# Patient Record
Sex: Female | Born: 1960 | Race: Black or African American | Hispanic: No | Marital: Single | Smoking: Former smoker
Health system: Southern US, Community
[De-identification: ages and names within clinical notes are randomized; demographics above are authoritative.]

## PROBLEM LIST (undated history)

## (undated) DIAGNOSIS — M2550 Pain in unspecified joint: Secondary | ICD-10-CM

## (undated) DIAGNOSIS — T148XXA Other injury of unspecified body region, initial encounter: Secondary | ICD-10-CM

## (undated) DIAGNOSIS — E7800 Pure hypercholesterolemia, unspecified: Secondary | ICD-10-CM

## (undated) DIAGNOSIS — R600 Localized edema: Secondary | ICD-10-CM

## (undated) DIAGNOSIS — H409 Unspecified glaucoma: Secondary | ICD-10-CM

## (undated) DIAGNOSIS — E119 Type 2 diabetes mellitus without complications: Secondary | ICD-10-CM

## (undated) DIAGNOSIS — K219 Gastro-esophageal reflux disease without esophagitis: Secondary | ICD-10-CM

## (undated) DIAGNOSIS — M1990 Unspecified osteoarthritis, unspecified site: Secondary | ICD-10-CM

## (undated) DIAGNOSIS — E559 Vitamin D deficiency, unspecified: Secondary | ICD-10-CM

## (undated) DIAGNOSIS — I10 Essential (primary) hypertension: Secondary | ICD-10-CM

## (undated) DIAGNOSIS — R0602 Shortness of breath: Secondary | ICD-10-CM

## (undated) DIAGNOSIS — M549 Dorsalgia, unspecified: Secondary | ICD-10-CM

## (undated) DIAGNOSIS — F32A Depression, unspecified: Secondary | ICD-10-CM

## (undated) HISTORY — PX: OTHER SURGICAL HISTORY: SHX169

## (undated) HISTORY — DX: Vitamin D deficiency, unspecified: E55.9

## (undated) HISTORY — PX: CHOLECYSTECTOMY: SHX55

## (undated) HISTORY — DX: Gastro-esophageal reflux disease without esophagitis: K21.9

## (undated) HISTORY — DX: Pain in unspecified joint: M25.50

## (undated) HISTORY — DX: Unspecified glaucoma: H40.9

## (undated) HISTORY — DX: Dorsalgia, unspecified: M54.9

## (undated) HISTORY — DX: Pure hypercholesterolemia, unspecified: E78.00

## (undated) HISTORY — DX: Depression, unspecified: F32.A

## (undated) HISTORY — DX: Localized edema: R60.0

## (undated) HISTORY — DX: Shortness of breath: R06.02

## (undated) HISTORY — DX: Other injury of unspecified body region, initial encounter: T14.8XXA

---

## 1999-03-23 HISTORY — PX: CHOLECYSTECTOMY: SHX55

## 1999-05-13 ENCOUNTER — Encounter: Admission: RE | Admit: 1999-05-13 | Discharge: 1999-08-11 | Payer: Self-pay | Admitting: Family Medicine

## 2001-05-16 ENCOUNTER — Encounter: Admission: RE | Admit: 2001-05-16 | Discharge: 2001-05-16 | Payer: Self-pay | Admitting: Family Medicine

## 2001-07-06 ENCOUNTER — Encounter: Admission: RE | Admit: 2001-07-06 | Discharge: 2001-07-06 | Payer: Self-pay | Admitting: Sports Medicine

## 2001-07-10 ENCOUNTER — Encounter: Admission: RE | Admit: 2001-07-10 | Discharge: 2001-07-10 | Payer: Self-pay | Admitting: Family Medicine

## 2001-08-30 ENCOUNTER — Ambulatory Visit (HOSPITAL_COMMUNITY): Admission: RE | Admit: 2001-08-30 | Discharge: 2001-08-30 | Payer: Self-pay | Admitting: Family Medicine

## 2001-08-30 ENCOUNTER — Encounter: Admission: RE | Admit: 2001-08-30 | Discharge: 2001-08-30 | Payer: Self-pay | Admitting: Family Medicine

## 2001-12-19 ENCOUNTER — Encounter: Admission: RE | Admit: 2001-12-19 | Discharge: 2001-12-19 | Payer: Self-pay | Admitting: Family Medicine

## 2001-12-25 ENCOUNTER — Encounter: Admission: RE | Admit: 2001-12-25 | Discharge: 2001-12-25 | Payer: Self-pay | Admitting: Sports Medicine

## 2002-01-09 ENCOUNTER — Encounter: Admission: RE | Admit: 2002-01-09 | Discharge: 2002-01-09 | Payer: Self-pay | Admitting: Family Medicine

## 2002-04-25 ENCOUNTER — Encounter: Admission: RE | Admit: 2002-04-25 | Discharge: 2002-04-25 | Payer: Self-pay | Admitting: Family Medicine

## 2002-06-12 ENCOUNTER — Encounter: Admission: RE | Admit: 2002-06-12 | Discharge: 2002-06-12 | Payer: Self-pay | Admitting: Family Medicine

## 2002-07-31 ENCOUNTER — Encounter: Admission: RE | Admit: 2002-07-31 | Discharge: 2002-07-31 | Payer: Self-pay | Admitting: Sports Medicine

## 2002-09-25 ENCOUNTER — Encounter: Admission: RE | Admit: 2002-09-25 | Discharge: 2002-09-25 | Payer: Self-pay | Admitting: Family Medicine

## 2002-10-22 ENCOUNTER — Encounter: Admission: RE | Admit: 2002-10-22 | Discharge: 2002-10-22 | Payer: Self-pay | Admitting: Family Medicine

## 2002-10-22 ENCOUNTER — Ambulatory Visit (HOSPITAL_COMMUNITY): Admission: RE | Admit: 2002-10-22 | Discharge: 2002-10-22 | Payer: Self-pay | Admitting: Family Medicine

## 2002-10-25 ENCOUNTER — Ambulatory Visit (HOSPITAL_COMMUNITY): Admission: RE | Admit: 2002-10-25 | Discharge: 2002-10-25 | Payer: Self-pay | Admitting: Family Medicine

## 2002-11-21 ENCOUNTER — Encounter: Admission: RE | Admit: 2002-11-21 | Discharge: 2002-11-21 | Payer: Self-pay | Admitting: Family Medicine

## 2002-12-25 ENCOUNTER — Encounter: Admission: RE | Admit: 2002-12-25 | Discharge: 2002-12-25 | Payer: Self-pay | Admitting: Sports Medicine

## 2002-12-25 ENCOUNTER — Encounter: Admission: RE | Admit: 2002-12-25 | Discharge: 2002-12-25 | Payer: Self-pay | Admitting: Family Medicine

## 2002-12-25 ENCOUNTER — Encounter: Payer: Self-pay | Admitting: Sports Medicine

## 2002-12-27 ENCOUNTER — Emergency Department (HOSPITAL_COMMUNITY): Admission: AD | Admit: 2002-12-27 | Discharge: 2002-12-28 | Payer: Self-pay | Admitting: Family Medicine

## 2002-12-28 ENCOUNTER — Encounter: Payer: Self-pay | Admitting: Emergency Medicine

## 2003-06-19 ENCOUNTER — Encounter: Admission: RE | Admit: 2003-06-19 | Discharge: 2003-06-19 | Payer: Self-pay | Admitting: Family Medicine

## 2003-07-17 ENCOUNTER — Encounter: Admission: RE | Admit: 2003-07-17 | Discharge: 2003-07-17 | Payer: Self-pay | Admitting: Sports Medicine

## 2003-09-30 ENCOUNTER — Encounter: Admission: RE | Admit: 2003-09-30 | Discharge: 2003-09-30 | Payer: Self-pay | Admitting: Family Medicine

## 2003-12-16 ENCOUNTER — Ambulatory Visit: Payer: Self-pay | Admitting: Family Medicine

## 2004-03-22 ENCOUNTER — Encounter (INDEPENDENT_AMBULATORY_CARE_PROVIDER_SITE_OTHER): Payer: Self-pay | Admitting: *Deleted

## 2004-03-22 LAB — CONVERTED CEMR LAB

## 2004-03-31 ENCOUNTER — Ambulatory Visit: Payer: Self-pay | Admitting: Family Medicine

## 2004-04-09 ENCOUNTER — Other Ambulatory Visit: Admission: RE | Admit: 2004-04-09 | Discharge: 2004-04-09 | Payer: Self-pay | Admitting: Family Medicine

## 2004-04-09 ENCOUNTER — Ambulatory Visit: Payer: Self-pay | Admitting: Family Medicine

## 2004-07-07 ENCOUNTER — Ambulatory Visit: Payer: Self-pay | Admitting: Family Medicine

## 2004-08-19 ENCOUNTER — Ambulatory Visit: Payer: Self-pay | Admitting: Family Medicine

## 2004-09-07 ENCOUNTER — Ambulatory Visit: Payer: Self-pay | Admitting: Sports Medicine

## 2004-09-11 ENCOUNTER — Encounter: Admission: RE | Admit: 2004-09-11 | Discharge: 2004-09-11 | Payer: Self-pay | Admitting: Sports Medicine

## 2004-09-23 ENCOUNTER — Encounter: Admission: RE | Admit: 2004-09-23 | Discharge: 2004-09-23 | Payer: Self-pay | Admitting: Family Medicine

## 2004-10-02 ENCOUNTER — Ambulatory Visit: Payer: Self-pay | Admitting: Family Medicine

## 2004-10-02 ENCOUNTER — Ambulatory Visit (HOSPITAL_COMMUNITY): Admission: RE | Admit: 2004-10-02 | Discharge: 2004-10-02 | Payer: Self-pay | Admitting: Family Medicine

## 2004-10-05 ENCOUNTER — Ambulatory Visit: Payer: Self-pay | Admitting: Family Medicine

## 2004-10-13 ENCOUNTER — Observation Stay (HOSPITAL_COMMUNITY): Admission: EM | Admit: 2004-10-13 | Discharge: 2004-10-15 | Payer: Self-pay | Admitting: Emergency Medicine

## 2004-10-13 ENCOUNTER — Ambulatory Visit: Payer: Self-pay | Admitting: Family Medicine

## 2004-10-14 ENCOUNTER — Encounter: Payer: Self-pay | Admitting: Cardiology

## 2004-10-14 ENCOUNTER — Ambulatory Visit: Payer: Self-pay | Admitting: Cardiology

## 2004-10-15 ENCOUNTER — Ambulatory Visit: Payer: Self-pay

## 2004-10-16 ENCOUNTER — Ambulatory Visit: Payer: Self-pay

## 2004-10-30 ENCOUNTER — Ambulatory Visit: Payer: Self-pay | Admitting: Family Medicine

## 2004-11-04 ENCOUNTER — Ambulatory Visit: Payer: Self-pay | Admitting: Cardiology

## 2005-01-13 ENCOUNTER — Ambulatory Visit: Payer: Self-pay | Admitting: Family Medicine

## 2005-01-29 ENCOUNTER — Ambulatory Visit: Payer: Self-pay | Admitting: Family Medicine

## 2005-02-25 ENCOUNTER — Ambulatory Visit: Payer: Self-pay | Admitting: Family Medicine

## 2005-03-25 ENCOUNTER — Ambulatory Visit: Payer: Self-pay | Admitting: Family Medicine

## 2005-04-12 ENCOUNTER — Ambulatory Visit: Payer: Self-pay | Admitting: Sports Medicine

## 2005-05-27 ENCOUNTER — Ambulatory Visit: Payer: Self-pay | Admitting: Family Medicine

## 2005-07-10 ENCOUNTER — Emergency Department (HOSPITAL_COMMUNITY): Admission: EM | Admit: 2005-07-10 | Discharge: 2005-07-10 | Payer: Self-pay | Admitting: Emergency Medicine

## 2005-07-15 ENCOUNTER — Ambulatory Visit: Payer: Self-pay | Admitting: Family Medicine

## 2005-07-22 ENCOUNTER — Ambulatory Visit: Payer: Self-pay | Admitting: Family Medicine

## 2005-10-11 ENCOUNTER — Ambulatory Visit: Payer: Self-pay | Admitting: Family Medicine

## 2005-10-13 ENCOUNTER — Emergency Department (HOSPITAL_COMMUNITY): Admission: EM | Admit: 2005-10-13 | Discharge: 2005-10-13 | Payer: Self-pay | Admitting: *Deleted

## 2005-10-13 ENCOUNTER — Encounter: Admission: RE | Admit: 2005-10-13 | Discharge: 2005-10-13 | Payer: Self-pay | Admitting: Family Medicine

## 2005-10-29 ENCOUNTER — Ambulatory Visit: Payer: Self-pay | Admitting: Family Medicine

## 2005-11-15 ENCOUNTER — Ambulatory Visit: Payer: Self-pay | Admitting: Family Medicine

## 2005-12-23 ENCOUNTER — Ambulatory Visit: Payer: Self-pay | Admitting: Family Medicine

## 2006-05-19 DIAGNOSIS — E669 Obesity, unspecified: Secondary | ICD-10-CM | POA: Insufficient documentation

## 2006-05-19 DIAGNOSIS — E7800 Pure hypercholesterolemia, unspecified: Secondary | ICD-10-CM | POA: Insufficient documentation

## 2006-05-19 DIAGNOSIS — F3289 Other specified depressive episodes: Secondary | ICD-10-CM | POA: Insufficient documentation

## 2006-05-19 DIAGNOSIS — I10 Essential (primary) hypertension: Secondary | ICD-10-CM | POA: Insufficient documentation

## 2006-05-19 DIAGNOSIS — E119 Type 2 diabetes mellitus without complications: Secondary | ICD-10-CM | POA: Insufficient documentation

## 2006-05-19 DIAGNOSIS — E1165 Type 2 diabetes mellitus with hyperglycemia: Secondary | ICD-10-CM

## 2006-05-19 DIAGNOSIS — F329 Major depressive disorder, single episode, unspecified: Secondary | ICD-10-CM | POA: Insufficient documentation

## 2006-05-19 DIAGNOSIS — J309 Allergic rhinitis, unspecified: Secondary | ICD-10-CM | POA: Insufficient documentation

## 2006-05-19 DIAGNOSIS — K219 Gastro-esophageal reflux disease without esophagitis: Secondary | ICD-10-CM | POA: Insufficient documentation

## 2006-05-20 ENCOUNTER — Encounter (INDEPENDENT_AMBULATORY_CARE_PROVIDER_SITE_OTHER): Payer: Self-pay | Admitting: *Deleted

## 2006-06-24 ENCOUNTER — Encounter (INDEPENDENT_AMBULATORY_CARE_PROVIDER_SITE_OTHER): Payer: Self-pay | Admitting: Family Medicine

## 2006-06-27 ENCOUNTER — Ambulatory Visit: Payer: Self-pay | Admitting: Sports Medicine

## 2006-06-27 ENCOUNTER — Encounter (INDEPENDENT_AMBULATORY_CARE_PROVIDER_SITE_OTHER): Payer: Self-pay | Admitting: Family Medicine

## 2006-06-27 LAB — CONVERTED CEMR LAB
HDL goal, serum: 40 mg/dL
Hgb A1c MFr Bld: 9.1 %
LDL Goal: 100 mg/dL

## 2006-06-29 ENCOUNTER — Emergency Department (HOSPITAL_COMMUNITY): Admission: EM | Admit: 2006-06-29 | Discharge: 2006-06-29 | Payer: Self-pay | Admitting: Family Medicine

## 2006-06-29 ENCOUNTER — Telehealth: Payer: Self-pay | Admitting: Family Medicine

## 2006-06-30 ENCOUNTER — Telehealth: Payer: Self-pay | Admitting: *Deleted

## 2006-06-30 ENCOUNTER — Encounter (INDEPENDENT_AMBULATORY_CARE_PROVIDER_SITE_OTHER): Payer: Self-pay | Admitting: *Deleted

## 2006-06-30 ENCOUNTER — Ambulatory Visit: Payer: Self-pay | Admitting: Family Medicine

## 2006-06-30 LAB — CONVERTED CEMR LAB
ALT: 20 units/L (ref 0–35)
AST: 18 units/L (ref 0–37)
Albumin: 4.2 g/dL (ref 3.5–5.2)
Alkaline Phosphatase: 87 units/L (ref 39–117)
Potassium: 3.8 meq/L (ref 3.5–5.3)
Sodium: 139 meq/L (ref 135–145)
Total Protein: 7.8 g/dL (ref 6.0–8.3)

## 2006-07-01 ENCOUNTER — Encounter (INDEPENDENT_AMBULATORY_CARE_PROVIDER_SITE_OTHER): Payer: Self-pay | Admitting: Family Medicine

## 2006-10-06 ENCOUNTER — Telehealth: Payer: Self-pay | Admitting: *Deleted

## 2006-10-10 ENCOUNTER — Ambulatory Visit: Payer: Self-pay | Admitting: Family Medicine

## 2006-10-10 LAB — CONVERTED CEMR LAB: Hgb A1c MFr Bld: 7.9 %

## 2007-03-03 ENCOUNTER — Ambulatory Visit: Payer: Self-pay | Admitting: Family Medicine

## 2007-03-03 ENCOUNTER — Encounter (INDEPENDENT_AMBULATORY_CARE_PROVIDER_SITE_OTHER): Payer: Self-pay | Admitting: Family Medicine

## 2007-03-03 LAB — CONVERTED CEMR LAB
AST: 12 units/L (ref 0–37)
Albumin: 4 g/dL (ref 3.5–5.2)
BUN: 8 mg/dL (ref 6–23)
Calcium: 9.4 mg/dL (ref 8.4–10.5)
Chloride: 104 meq/L (ref 96–112)
Glucose, Bld: 174 mg/dL — ABNORMAL HIGH (ref 70–99)
Potassium: 3.7 meq/L (ref 3.5–5.3)

## 2007-06-07 ENCOUNTER — Ambulatory Visit: Payer: Self-pay | Admitting: Family Medicine

## 2007-06-15 ENCOUNTER — Ambulatory Visit: Payer: Self-pay | Admitting: Family Medicine

## 2007-06-15 LAB — CONVERTED CEMR LAB
Glucose, Urine, Semiquant: 250
Protein, U semiquant: NEGATIVE

## 2007-06-29 ENCOUNTER — Encounter (INDEPENDENT_AMBULATORY_CARE_PROVIDER_SITE_OTHER): Payer: Self-pay | Admitting: *Deleted

## 2007-06-29 ENCOUNTER — Encounter (INDEPENDENT_AMBULATORY_CARE_PROVIDER_SITE_OTHER): Payer: Self-pay | Admitting: Family Medicine

## 2007-06-29 ENCOUNTER — Telehealth (INDEPENDENT_AMBULATORY_CARE_PROVIDER_SITE_OTHER): Payer: Self-pay | Admitting: Family Medicine

## 2007-07-06 ENCOUNTER — Ambulatory Visit: Payer: Self-pay | Admitting: Family Medicine

## 2007-07-06 ENCOUNTER — Encounter (INDEPENDENT_AMBULATORY_CARE_PROVIDER_SITE_OTHER): Payer: Self-pay | Admitting: Family Medicine

## 2007-07-13 LAB — CONVERTED CEMR LAB
AST: 14 units/L (ref 0–37)
BUN: 11 mg/dL (ref 6–23)
Calcium: 9.3 mg/dL (ref 8.4–10.5)
Chloride: 102 meq/L (ref 96–112)
Cholesterol: 188 mg/dL (ref 0–200)
Creatinine, Ser: 0.83 mg/dL (ref 0.40–1.20)
HDL: 47 mg/dL (ref >39)
Total CHOL/HDL Ratio: 4
Triglycerides: 96 mg/dL (ref <150)

## 2007-07-24 ENCOUNTER — Encounter: Admission: RE | Admit: 2007-07-24 | Discharge: 2007-10-22 | Payer: Self-pay | Admitting: Family Medicine

## 2007-07-26 ENCOUNTER — Ambulatory Visit: Payer: Self-pay | Admitting: Family Medicine

## 2007-07-28 ENCOUNTER — Encounter (INDEPENDENT_AMBULATORY_CARE_PROVIDER_SITE_OTHER): Payer: Self-pay | Admitting: *Deleted

## 2007-07-29 ENCOUNTER — Emergency Department (HOSPITAL_COMMUNITY): Admission: EM | Admit: 2007-07-29 | Discharge: 2007-07-30 | Payer: Self-pay | Admitting: Emergency Medicine

## 2007-08-01 ENCOUNTER — Ambulatory Visit: Payer: Self-pay | Admitting: Family Medicine

## 2007-08-02 ENCOUNTER — Ambulatory Visit: Payer: Self-pay | Admitting: Family Medicine

## 2007-08-02 ENCOUNTER — Encounter: Payer: Self-pay | Admitting: Family Medicine

## 2007-08-22 ENCOUNTER — Telehealth: Payer: Self-pay | Admitting: Family Medicine

## 2007-08-23 ENCOUNTER — Encounter: Payer: Self-pay | Admitting: Family Medicine

## 2007-09-14 ENCOUNTER — Telehealth (INDEPENDENT_AMBULATORY_CARE_PROVIDER_SITE_OTHER): Payer: Self-pay | Admitting: *Deleted

## 2007-10-02 ENCOUNTER — Telehealth: Payer: Self-pay | Admitting: *Deleted

## 2007-10-03 ENCOUNTER — Ambulatory Visit: Payer: Self-pay | Admitting: Family Medicine

## 2007-10-03 DIAGNOSIS — M719 Bursopathy, unspecified: Secondary | ICD-10-CM

## 2007-10-03 DIAGNOSIS — M67919 Unspecified disorder of synovium and tendon, unspecified shoulder: Secondary | ICD-10-CM | POA: Insufficient documentation

## 2007-10-18 ENCOUNTER — Encounter: Admission: RE | Admit: 2007-10-18 | Discharge: 2007-10-27 | Payer: Self-pay | Admitting: Family Medicine

## 2007-10-23 ENCOUNTER — Ambulatory Visit: Payer: Self-pay | Admitting: Sports Medicine

## 2007-10-25 ENCOUNTER — Telehealth (INDEPENDENT_AMBULATORY_CARE_PROVIDER_SITE_OTHER): Payer: Self-pay | Admitting: Family Medicine

## 2007-10-26 ENCOUNTER — Encounter (INDEPENDENT_AMBULATORY_CARE_PROVIDER_SITE_OTHER): Payer: Self-pay | Admitting: Family Medicine

## 2007-10-27 ENCOUNTER — Telehealth (INDEPENDENT_AMBULATORY_CARE_PROVIDER_SITE_OTHER): Payer: Self-pay | Admitting: *Deleted

## 2007-10-31 ENCOUNTER — Telehealth: Payer: Self-pay | Admitting: *Deleted

## 2007-10-31 ENCOUNTER — Encounter: Payer: Self-pay | Admitting: Family Medicine

## 2007-10-31 ENCOUNTER — Ambulatory Visit: Payer: Self-pay | Admitting: Family Medicine

## 2007-10-31 LAB — CONVERTED CEMR LAB
ALT: 14 units/L (ref 0–35)
AST: 13 units/L (ref 0–37)
Albumin: 4.4 g/dL (ref 3.5–5.2)
Alkaline Phosphatase: 100 units/L (ref 39–117)
Calcium: 10.1 mg/dL (ref 8.4–10.5)
Chloride: 96 meq/L (ref 96–112)
Glucose, Urine, Semiquant: NEGATIVE
Nitrite: NEGATIVE
Potassium: 3.9 meq/L (ref 3.5–5.3)
Protein, U semiquant: 30
Sodium: 137 meq/L (ref 135–145)
Urobilinogen, UA: 0.2
WBC Urine, dipstick: NEGATIVE

## 2007-11-02 ENCOUNTER — Encounter (INDEPENDENT_AMBULATORY_CARE_PROVIDER_SITE_OTHER): Payer: Self-pay | Admitting: Family Medicine

## 2007-11-06 ENCOUNTER — Telehealth: Payer: Self-pay | Admitting: *Deleted

## 2007-11-07 ENCOUNTER — Ambulatory Visit: Payer: Self-pay | Admitting: Family Medicine

## 2007-11-21 ENCOUNTER — Ambulatory Visit: Payer: Self-pay | Admitting: Sports Medicine

## 2007-11-21 LAB — CONVERTED CEMR LAB: Hgb A1c MFr Bld: 10.4 %

## 2007-12-13 ENCOUNTER — Encounter: Admission: RE | Admit: 2007-12-13 | Discharge: 2007-12-13 | Payer: Self-pay | Admitting: Family Medicine

## 2008-01-17 ENCOUNTER — Ambulatory Visit: Payer: Self-pay | Admitting: Family Medicine

## 2008-01-17 ENCOUNTER — Encounter (INDEPENDENT_AMBULATORY_CARE_PROVIDER_SITE_OTHER): Payer: Self-pay | Admitting: Family Medicine

## 2008-01-17 LAB — CONVERTED CEMR LAB
Albumin: 4.3 g/dL (ref 3.5–5.2)
Alkaline Phosphatase: 64 units/L (ref 39–117)
BUN: 13 mg/dL (ref 6–23)
CO2: 24 meq/L (ref 19–32)
Glucose, Bld: 134 mg/dL — ABNORMAL HIGH (ref 70–99)
Potassium: 3.9 meq/L (ref 3.5–5.3)
Sodium: 139 meq/L (ref 135–145)
Total Bilirubin: 0.5 mg/dL (ref 0.3–1.2)
Total Protein: 8 g/dL (ref 6.0–8.3)

## 2008-01-19 ENCOUNTER — Ambulatory Visit: Payer: Self-pay | Admitting: Family Medicine

## 2008-01-22 ENCOUNTER — Telehealth (INDEPENDENT_AMBULATORY_CARE_PROVIDER_SITE_OTHER): Payer: Self-pay | Admitting: Family Medicine

## 2008-01-22 ENCOUNTER — Ambulatory Visit: Payer: Self-pay | Admitting: Family Medicine

## 2008-01-23 ENCOUNTER — Ambulatory Visit: Payer: Self-pay | Admitting: Family Medicine

## 2008-01-24 ENCOUNTER — Ambulatory Visit (HOSPITAL_COMMUNITY): Admission: RE | Admit: 2008-01-24 | Discharge: 2008-01-24 | Payer: Self-pay | Admitting: Family Medicine

## 2008-02-06 ENCOUNTER — Encounter (INDEPENDENT_AMBULATORY_CARE_PROVIDER_SITE_OTHER): Payer: Self-pay | Admitting: Family Medicine

## 2008-02-26 ENCOUNTER — Telehealth: Payer: Self-pay | Admitting: *Deleted

## 2008-03-05 ENCOUNTER — Ambulatory Visit: Payer: Self-pay | Admitting: Family Medicine

## 2008-03-05 DIAGNOSIS — G609 Hereditary and idiopathic neuropathy, unspecified: Secondary | ICD-10-CM | POA: Insufficient documentation

## 2008-03-07 ENCOUNTER — Telehealth: Payer: Self-pay | Admitting: *Deleted

## 2008-03-27 ENCOUNTER — Telehealth: Payer: Self-pay | Admitting: Family Medicine

## 2008-03-27 ENCOUNTER — Telehealth (INDEPENDENT_AMBULATORY_CARE_PROVIDER_SITE_OTHER): Payer: Self-pay | Admitting: Family Medicine

## 2008-03-28 ENCOUNTER — Telehealth (INDEPENDENT_AMBULATORY_CARE_PROVIDER_SITE_OTHER): Payer: Self-pay | Admitting: *Deleted

## 2008-04-04 ENCOUNTER — Ambulatory Visit: Payer: Self-pay | Admitting: Family Medicine

## 2008-04-04 LAB — CONVERTED CEMR LAB: Hgb A1c MFr Bld: 8.5 %

## 2008-04-10 ENCOUNTER — Encounter (INDEPENDENT_AMBULATORY_CARE_PROVIDER_SITE_OTHER): Payer: Self-pay | Admitting: Family Medicine
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
## 2008-04-12 ENCOUNTER — Encounter (INDEPENDENT_AMBULATORY_CARE_PROVIDER_SITE_OTHER): Payer: Self-pay | Admitting: Family Medicine

## 2008-04-12 LAB — CONVERTED CEMR LAB
OCCULT 1: POSITIVE
OCCULT 2: POSITIVE

## 2008-04-15 ENCOUNTER — Telehealth (INDEPENDENT_AMBULATORY_CARE_PROVIDER_SITE_OTHER): Payer: Self-pay | Admitting: *Deleted

## 2008-04-21 ENCOUNTER — Telehealth: Payer: Self-pay | Admitting: Family Medicine

## 2008-04-25 ENCOUNTER — Ambulatory Visit: Payer: Self-pay | Admitting: Family Medicine

## 2008-04-26 ENCOUNTER — Ambulatory Visit: Payer: Self-pay | Admitting: Gastroenterology

## 2008-04-28 ENCOUNTER — Telehealth (INDEPENDENT_AMBULATORY_CARE_PROVIDER_SITE_OTHER): Payer: Self-pay | Admitting: Family Medicine

## 2008-05-09 ENCOUNTER — Ambulatory Visit: Payer: Self-pay | Admitting: Gastroenterology

## 2008-05-09 LAB — CONVERTED CEMR LAB
Basophils Absolute: 0 10*3/uL (ref 0.0–0.1)
Eosinophils Absolute: 0 10*3/uL (ref 0.0–0.7)
Lymphocytes Relative: 27.6 % (ref 12.0–46.0)
MCHC: 32.4 g/dL (ref 30.0–36.0)
MCV: 82.6 fL (ref 78.0–100.0)
Neutrophils Relative %: 63.4 % (ref 43.0–77.0)
Platelets: 345 10*3/uL (ref 150–400)
RBC: 4.62 M/uL (ref 3.87–5.11)
RDW: 13.8 % (ref 11.5–14.6)

## 2008-05-10 ENCOUNTER — Ambulatory Visit: Payer: Self-pay | Admitting: Gastroenterology

## 2008-05-14 ENCOUNTER — Ambulatory Visit: Payer: Self-pay | Admitting: Gastroenterology

## 2008-05-14 LAB — CONVERTED CEMR LAB
Basophils Relative: 0.5 % (ref 0.0–3.0)
Eosinophils Relative: 1.3 % (ref 0.0–5.0)
HCT: 37.1 % (ref 36.0–46.0)
Hemoglobin: 12 g/dL (ref 12.0–15.0)
Lymphocytes Relative: 32 % (ref 12.0–46.0)
Monocytes Absolute: 0.6 10*3/uL (ref 0.1–1.0)
Monocytes Relative: 7.6 % (ref 3.0–12.0)
Neutro Abs: 4.5 10*3/uL (ref 1.4–7.7)
RBC: 4.51 M/uL (ref 3.87–5.11)
RDW: 14 % (ref 11.5–14.6)
WBC: 7.7 10*3/uL (ref 4.5–10.5)

## 2008-05-15 ENCOUNTER — Ambulatory Visit: Payer: Self-pay | Admitting: Gastroenterology

## 2008-05-21 ENCOUNTER — Ambulatory Visit: Payer: Self-pay | Admitting: Family Medicine

## 2008-05-21 ENCOUNTER — Ambulatory Visit: Payer: Self-pay | Admitting: Gastroenterology

## 2008-05-21 DIAGNOSIS — M25519 Pain in unspecified shoulder: Secondary | ICD-10-CM | POA: Insufficient documentation

## 2008-05-21 LAB — CONVERTED CEMR LAB
Fecal Occult Blood: NEGATIVE
OCCULT 1: NEGATIVE
OCCULT 4: NEGATIVE
OCCULT 5: NEGATIVE

## 2008-05-23 ENCOUNTER — Encounter: Payer: Self-pay | Admitting: Gastroenterology

## 2008-05-30 ENCOUNTER — Ambulatory Visit: Payer: Self-pay | Admitting: Family Medicine

## 2008-06-17 ENCOUNTER — Ambulatory Visit: Payer: Self-pay | Admitting: Family Medicine

## 2008-06-17 ENCOUNTER — Encounter (INDEPENDENT_AMBULATORY_CARE_PROVIDER_SITE_OTHER): Payer: Self-pay | Admitting: Family Medicine

## 2008-06-17 DIAGNOSIS — M549 Dorsalgia, unspecified: Secondary | ICD-10-CM | POA: Insufficient documentation

## 2008-06-21 ENCOUNTER — Telehealth (INDEPENDENT_AMBULATORY_CARE_PROVIDER_SITE_OTHER): Payer: Self-pay | Admitting: Family Medicine

## 2008-06-25 ENCOUNTER — Ambulatory Visit: Payer: Self-pay | Admitting: Family Medicine

## 2008-06-25 ENCOUNTER — Encounter (INDEPENDENT_AMBULATORY_CARE_PROVIDER_SITE_OTHER): Payer: Self-pay | Admitting: Family Medicine

## 2008-06-26 ENCOUNTER — Ambulatory Visit (HOSPITAL_COMMUNITY): Admission: RE | Admit: 2008-06-26 | Discharge: 2008-06-26 | Payer: Self-pay | Admitting: Family Medicine

## 2008-06-27 ENCOUNTER — Ambulatory Visit: Payer: Self-pay | Admitting: Family Medicine

## 2008-06-27 ENCOUNTER — Encounter (INDEPENDENT_AMBULATORY_CARE_PROVIDER_SITE_OTHER): Payer: Self-pay | Admitting: Family Medicine

## 2008-06-27 LAB — CONVERTED CEMR LAB

## 2008-06-28 ENCOUNTER — Encounter (INDEPENDENT_AMBULATORY_CARE_PROVIDER_SITE_OTHER): Payer: Self-pay | Admitting: Family Medicine

## 2008-06-28 LAB — CONVERTED CEMR LAB
AST: 11 units/L (ref 0–37)
BUN: 18 mg/dL (ref 6–23)
Calcium: 10.4 mg/dL (ref 8.4–10.5)
Chloride: 102 meq/L (ref 96–112)
Cholesterol: 127 mg/dL (ref 0–200)
Creatinine, Ser: 0.78 mg/dL (ref 0.40–1.20)
HDL: 48 mg/dL (ref >39)
Total Bilirubin: 0.2 mg/dL — ABNORMAL LOW (ref 0.3–1.2)
Total CHOL/HDL Ratio: 2.6
VLDL: 23 mg/dL (ref 0–40)

## 2008-07-02 ENCOUNTER — Encounter (INDEPENDENT_AMBULATORY_CARE_PROVIDER_SITE_OTHER): Payer: Self-pay | Admitting: Family Medicine

## 2008-07-08 ENCOUNTER — Ambulatory Visit: Payer: Self-pay | Admitting: Family Medicine

## 2008-07-08 ENCOUNTER — Encounter (INDEPENDENT_AMBULATORY_CARE_PROVIDER_SITE_OTHER): Payer: Self-pay | Admitting: Family Medicine

## 2008-07-09 ENCOUNTER — Encounter: Admission: RE | Admit: 2008-07-09 | Discharge: 2008-09-11 | Payer: Self-pay | Admitting: Family Medicine

## 2008-07-11 ENCOUNTER — Encounter (INDEPENDENT_AMBULATORY_CARE_PROVIDER_SITE_OTHER): Payer: Self-pay | Admitting: Family Medicine

## 2008-07-22 ENCOUNTER — Ambulatory Visit: Payer: Self-pay | Admitting: Family Medicine

## 2008-07-22 ENCOUNTER — Encounter (INDEPENDENT_AMBULATORY_CARE_PROVIDER_SITE_OTHER): Payer: Self-pay | Admitting: Family Medicine

## 2008-07-22 LAB — CONVERTED CEMR LAB
Glucose, Urine, Semiquant: 100
Urobilinogen, UA: 0.2
WBC Urine, dipstick: NEGATIVE

## 2008-07-29 ENCOUNTER — Encounter (INDEPENDENT_AMBULATORY_CARE_PROVIDER_SITE_OTHER): Payer: Self-pay | Admitting: Family Medicine

## 2008-08-05 ENCOUNTER — Encounter (INDEPENDENT_AMBULATORY_CARE_PROVIDER_SITE_OTHER): Payer: Self-pay | Admitting: Family Medicine

## 2008-08-05 ENCOUNTER — Ambulatory Visit: Payer: Self-pay | Admitting: Family Medicine

## 2008-08-14 ENCOUNTER — Encounter (INDEPENDENT_AMBULATORY_CARE_PROVIDER_SITE_OTHER): Payer: Self-pay | Admitting: Family Medicine

## 2008-08-14 ENCOUNTER — Ambulatory Visit: Payer: Self-pay | Admitting: Family Medicine

## 2008-08-14 ENCOUNTER — Observation Stay (HOSPITAL_COMMUNITY): Admission: AD | Admit: 2008-08-14 | Discharge: 2008-08-15 | Payer: Self-pay | Admitting: Family Medicine

## 2008-08-14 ENCOUNTER — Encounter: Payer: Self-pay | Admitting: *Deleted

## 2008-08-14 ENCOUNTER — Ambulatory Visit: Payer: Self-pay | Admitting: Cardiology

## 2008-08-15 ENCOUNTER — Encounter: Payer: Self-pay | Admitting: Family Medicine

## 2008-08-16 ENCOUNTER — Encounter (INDEPENDENT_AMBULATORY_CARE_PROVIDER_SITE_OTHER): Payer: Self-pay | Admitting: Family Medicine

## 2008-08-28 ENCOUNTER — Encounter (INDEPENDENT_AMBULATORY_CARE_PROVIDER_SITE_OTHER): Payer: Self-pay | Admitting: Family Medicine

## 2008-08-28 ENCOUNTER — Encounter: Payer: Self-pay | Admitting: Cardiology

## 2008-08-28 ENCOUNTER — Ambulatory Visit: Payer: Self-pay | Admitting: Family Medicine

## 2008-08-30 ENCOUNTER — Telehealth: Payer: Self-pay | Admitting: *Deleted

## 2008-09-18 ENCOUNTER — Encounter (INDEPENDENT_AMBULATORY_CARE_PROVIDER_SITE_OTHER): Payer: Self-pay | Admitting: Family Medicine

## 2008-09-19 ENCOUNTER — Encounter (INDEPENDENT_AMBULATORY_CARE_PROVIDER_SITE_OTHER): Payer: Self-pay | Admitting: Family Medicine

## 2008-09-27 ENCOUNTER — Ambulatory Visit: Payer: Self-pay | Admitting: Family Medicine

## 2008-09-27 ENCOUNTER — Encounter: Payer: Self-pay | Admitting: Family Medicine

## 2008-09-30 ENCOUNTER — Ambulatory Visit: Payer: Self-pay | Admitting: Cardiology

## 2008-09-30 ENCOUNTER — Encounter: Payer: Self-pay | Admitting: Family Medicine

## 2008-10-02 ENCOUNTER — Telehealth (INDEPENDENT_AMBULATORY_CARE_PROVIDER_SITE_OTHER): Payer: Self-pay | Admitting: *Deleted

## 2008-10-03 ENCOUNTER — Ambulatory Visit: Payer: Self-pay

## 2008-10-04 ENCOUNTER — Ambulatory Visit: Payer: Self-pay | Admitting: Sports Medicine

## 2008-10-08 ENCOUNTER — Ambulatory Visit: Payer: Self-pay

## 2008-10-08 ENCOUNTER — Encounter: Payer: Self-pay | Admitting: Cardiology

## 2008-10-24 ENCOUNTER — Telehealth (INDEPENDENT_AMBULATORY_CARE_PROVIDER_SITE_OTHER): Payer: Self-pay | Admitting: *Deleted

## 2008-11-05 ENCOUNTER — Telehealth: Payer: Self-pay | Admitting: *Deleted

## 2008-11-06 ENCOUNTER — Ambulatory Visit: Payer: Self-pay | Admitting: Internal Medicine

## 2008-11-07 ENCOUNTER — Telehealth: Payer: Self-pay | Admitting: Family Medicine

## 2008-11-13 ENCOUNTER — Encounter: Payer: Self-pay | Admitting: Cardiology

## 2008-11-22 ENCOUNTER — Encounter: Payer: Self-pay | Admitting: Cardiology

## 2008-11-26 ENCOUNTER — Ambulatory Visit: Payer: Self-pay | Admitting: Cardiology

## 2008-11-27 ENCOUNTER — Ambulatory Visit: Payer: Self-pay | Admitting: Cardiology

## 2008-11-28 ENCOUNTER — Ambulatory Visit: Payer: Self-pay | Admitting: Family Medicine

## 2008-11-28 ENCOUNTER — Encounter: Payer: Self-pay | Admitting: Family Medicine

## 2008-12-08 ENCOUNTER — Telehealth: Payer: Self-pay | Admitting: Family Medicine

## 2008-12-13 ENCOUNTER — Encounter: Admission: RE | Admit: 2008-12-13 | Discharge: 2008-12-13 | Payer: Self-pay | Admitting: Family Medicine

## 2008-12-27 ENCOUNTER — Ambulatory Visit: Payer: Self-pay | Admitting: Family Medicine

## 2008-12-27 ENCOUNTER — Encounter: Payer: Self-pay | Admitting: Family Medicine

## 2008-12-27 LAB — CONVERTED CEMR LAB
ALT: 20 units/L (ref 0–35)
AST: 14 units/L (ref 0–37)
Alkaline Phosphatase: 85 units/L (ref 39–117)
Creatinine, Ser: 0.9 mg/dL (ref 0.40–1.20)
Glucose, Urine, Semiquant: >=1000
Hgb A1c MFr Bld: 12.3 %
Protein, U semiquant: 30
Total Bilirubin: 0.4 mg/dL (ref 0.3–1.2)

## 2009-02-03 ENCOUNTER — Ambulatory Visit: Payer: Self-pay | Admitting: Family Medicine

## 2009-02-05 ENCOUNTER — Ambulatory Visit: Payer: Self-pay | Admitting: Family Medicine

## 2009-02-07 ENCOUNTER — Ambulatory Visit: Payer: Self-pay | Admitting: Family Medicine

## 2009-03-05 ENCOUNTER — Encounter: Admission: RE | Admit: 2009-03-05 | Discharge: 2009-03-21 | Payer: Self-pay | Admitting: Family Medicine

## 2009-03-05 ENCOUNTER — Encounter: Payer: Self-pay | Admitting: Family Medicine

## 2009-03-14 ENCOUNTER — Telehealth: Payer: Self-pay | Admitting: Family Medicine

## 2009-04-09 ENCOUNTER — Encounter: Payer: Self-pay | Admitting: Family Medicine

## 2009-04-09 ENCOUNTER — Ambulatory Visit: Payer: Self-pay | Admitting: Family Medicine

## 2009-04-09 DIAGNOSIS — I498 Other specified cardiac arrhythmias: Secondary | ICD-10-CM | POA: Insufficient documentation

## 2009-04-09 LAB — CONVERTED CEMR LAB
Direct LDL: 66 mg/dL
Hemoglobin: 13.4 g/dL

## 2009-04-10 ENCOUNTER — Encounter: Admission: RE | Admit: 2009-04-10 | Discharge: 2009-04-10 | Payer: Self-pay | Admitting: Family Medicine

## 2009-04-10 ENCOUNTER — Encounter: Payer: Self-pay | Admitting: Family Medicine

## 2009-04-10 ENCOUNTER — Telehealth: Payer: Self-pay | Admitting: Family Medicine

## 2009-04-11 ENCOUNTER — Ambulatory Visit: Payer: Self-pay | Admitting: Family Medicine

## 2009-04-12 ENCOUNTER — Telehealth: Payer: Self-pay | Admitting: Family Medicine

## 2009-04-22 ENCOUNTER — Telehealth: Payer: Self-pay | Admitting: Family Medicine

## 2009-06-06 ENCOUNTER — Ambulatory Visit: Payer: Self-pay | Admitting: Family Medicine

## 2009-07-04 ENCOUNTER — Telehealth (INDEPENDENT_AMBULATORY_CARE_PROVIDER_SITE_OTHER): Payer: Self-pay | Admitting: *Deleted

## 2009-07-07 ENCOUNTER — Ambulatory Visit: Payer: Self-pay | Admitting: Family Medicine

## 2009-07-07 DIAGNOSIS — M25569 Pain in unspecified knee: Secondary | ICD-10-CM | POA: Insufficient documentation

## 2009-07-15 ENCOUNTER — Encounter: Payer: Self-pay | Admitting: Family Medicine

## 2009-07-22 ENCOUNTER — Ambulatory Visit: Payer: Self-pay | Admitting: Family Medicine

## 2009-07-22 LAB — CONVERTED CEMR LAB: Hgb A1c MFr Bld: 11.8 %

## 2009-08-01 ENCOUNTER — Ambulatory Visit: Payer: Self-pay | Admitting: Family Medicine

## 2009-08-01 ENCOUNTER — Encounter: Payer: Self-pay | Admitting: Family Medicine

## 2009-08-01 DIAGNOSIS — R609 Edema, unspecified: Secondary | ICD-10-CM | POA: Insufficient documentation

## 2009-08-01 LAB — CONVERTED CEMR LAB
ALT: 20 units/L (ref 0–35)
AST: 15 units/L (ref 0–37)
Calcium: 9.3 mg/dL (ref 8.4–10.5)
Chloride: 100 meq/L (ref 96–112)
Creatinine, Ser: 0.86 mg/dL (ref 0.40–1.20)
Potassium: 4.3 meq/L (ref 3.5–5.3)
Sodium: 138 meq/L (ref 135–145)

## 2009-08-19 ENCOUNTER — Telehealth (INDEPENDENT_AMBULATORY_CARE_PROVIDER_SITE_OTHER): Payer: Self-pay | Admitting: Pharmacist

## 2009-08-25 ENCOUNTER — Telehealth: Payer: Self-pay | Admitting: Family Medicine

## 2009-08-26 ENCOUNTER — Ambulatory Visit: Payer: Self-pay | Admitting: Family Medicine

## 2009-09-09 ENCOUNTER — Ambulatory Visit: Payer: Self-pay | Admitting: Family Medicine

## 2009-09-09 ENCOUNTER — Encounter (INDEPENDENT_AMBULATORY_CARE_PROVIDER_SITE_OTHER): Payer: Self-pay | Admitting: Pharmacist

## 2009-09-09 LAB — CONVERTED CEMR LAB
Chloride: 99 meq/L (ref 96–112)
Potassium: 4.4 meq/L (ref 3.5–5.3)
Sodium: 139 meq/L (ref 135–145)

## 2009-10-02 ENCOUNTER — Ambulatory Visit: Payer: Self-pay | Admitting: Family Medicine

## 2009-10-14 ENCOUNTER — Ambulatory Visit: Payer: Self-pay | Admitting: Family Medicine

## 2009-10-15 ENCOUNTER — Encounter: Admission: RE | Admit: 2009-10-15 | Discharge: 2009-10-15 | Payer: Self-pay | Admitting: Family Medicine

## 2009-10-31 ENCOUNTER — Ambulatory Visit: Payer: Self-pay | Admitting: Family Medicine

## 2009-11-12 ENCOUNTER — Ambulatory Visit: Payer: Self-pay | Admitting: Family Medicine

## 2009-11-12 DIAGNOSIS — L919 Hypertrophic disorder of the skin, unspecified: Secondary | ICD-10-CM

## 2009-11-12 DIAGNOSIS — L909 Atrophic disorder of skin, unspecified: Secondary | ICD-10-CM | POA: Insufficient documentation

## 2009-12-15 ENCOUNTER — Encounter: Admission: RE | Admit: 2009-12-15 | Discharge: 2009-12-15 | Payer: Self-pay | Admitting: Family Medicine

## 2010-01-28 ENCOUNTER — Encounter: Payer: Self-pay | Admitting: Family Medicine

## 2010-01-30 ENCOUNTER — Ambulatory Visit: Payer: Self-pay | Admitting: Family Medicine

## 2010-01-30 ENCOUNTER — Encounter: Payer: Self-pay | Admitting: Family Medicine

## 2010-02-11 ENCOUNTER — Telehealth (INDEPENDENT_AMBULATORY_CARE_PROVIDER_SITE_OTHER): Payer: Self-pay | Admitting: *Deleted

## 2010-02-17 ENCOUNTER — Encounter: Payer: Self-pay | Admitting: Family Medicine

## 2010-02-20 ENCOUNTER — Encounter: Payer: Self-pay | Admitting: Family Medicine

## 2010-03-02 ENCOUNTER — Encounter (INDEPENDENT_AMBULATORY_CARE_PROVIDER_SITE_OTHER): Payer: Self-pay | Admitting: *Deleted

## 2010-03-05 ENCOUNTER — Telehealth (INDEPENDENT_AMBULATORY_CARE_PROVIDER_SITE_OTHER): Payer: Self-pay | Admitting: *Deleted

## 2010-03-09 ENCOUNTER — Encounter: Payer: Self-pay | Admitting: Family Medicine

## 2010-03-12 ENCOUNTER — Encounter: Payer: Self-pay | Admitting: Family Medicine

## 2010-04-12 ENCOUNTER — Encounter: Payer: Self-pay | Admitting: Sports Medicine

## 2010-04-13 ENCOUNTER — Encounter: Payer: Self-pay | Admitting: Family Medicine

## 2010-04-21 NOTE — Progress Notes (Signed)
  Phone Note Outgoing Call   Call placed by: Lequita Asal  MD,  April 10, 2009 9:19 AM Summary of Call: called to discuss results. A1C >14. needs insulin but opposed. patient to schedule in rx clinic.  Initial call taken by: Lequita Asal  MD,  April 10, 2009 9:20 AM

## 2010-04-21 NOTE — Assessment & Plan Note (Signed)
Summary: Diabetes F/U - Rx Clinic   Vital Signs:  Patient profile:   50 year old female Weight:      293.7 pounds BMI:     55.69 Pulse rate:   66 / minute BP sitting:   133 / 79  (left arm)  Primary Care Provider:  Lequita Asal  MD   History of Present Illness: Patient arrives in great spirits.   She is happy that she is able to walk.  She is able to walk 30 minutes in the AM AND in the PM with the addition of acetaminophen.   Her blood glucose readings are consistently 90-140 throughout the entire day with the addition of Victoza.   We discussed insulin dosing and she is aware that we may be able to reduce her insulin dose if she continues to lose weight.   Patient states walking has helped to reduce leg swelling.   Habits & Providers  Alcohol-Tobacco-Diet     Tobacco Status: quit  Current Medications (verified): 1)  Aspirin Ec 81 Mg Tbec (Aspirin) .... Take 1 Tablet By Mouth Once A Day 2)  Caltrate 600+d Plus 600-400 Mg-Unit Tabs (Calcium Carbonate-Vit D-Min) .... Take 1 Tablet By Mouth Twice A Day 3)  Quinapril-Hydrochlorothiazide 20-25 Mg Tabs (Quinapril-Hydrochlorothiazide) .... One Tab By Mouth Daily 4)  Glipizide 10 Mg  Tabs (Glipizide) .Marland Kitchen.. 1 Tablet By Mouth Two Times A Day 5)  Nitroglycerin 0.4 Mg Subl (Nitroglycerin) .... Place 1 Tablet Under Tongue As Directed 6)  Prodigy Blood Glucose Test  Strp (Glucose Blood) .... Use As Directed. 7)  Flexeril 10 Mg  Tabs (Cyclobenzaprine Hcl) .Marland Kitchen.. 1 Tab By Mouth At Bedtime As Needed For Back Pain 8)  Flonase 50 Mcg/act  Susp (Fluticasone Propionate) .Marland Kitchen.. 1-2 Sprays Each Nostril Two Times A Day As Needed For Allergy Symptoms 9)  Metformin Hcl 500 Mg Tabs (Metformin Hcl) .... One Tab By Mouth Bid 10)  Simvastatin 40 Mg Tabs (Simvastatin) .... One Tab By Mouth Qhs 11)  Loratadine 10 Mg Tabs (Loratadine) .... One Tab By Mouth Daily 12)  Amitriptyline Hcl 100 Mg Tabs (Amitriptyline Hcl) .Marland Kitchen.. 1 Tablet By Mouth At Bedtime 13)   Gabapentin 100 Mg Caps (Gabapentin) .... One Tab By Mouth Tid 14)  Lantus 100 Unit/ml Soln (Insulin Glargine) .... 48 Units Twice A Day. Dispense Qs For 3 Month Supply. 15)  Bd Insulin Syringe Ultrafine 30g X 1/2" 1 Ml Misc (Insulin Syringe-Needle U-100) .... Dispence Quantity Sufficient For Once Daily Injection.  Dispense One Box. 16)  Metoprolol Tartrate 50 Mg Tabs (Metoprolol Tartrate) .... One Tab By Mouth Bid 17)  Meloxicam 15 Mg Tabs (Meloxicam) .... One Tablet Daily With Food For Pain 18)  Furosemide 20 Mg Tabs (Furosemide) .... One Tab By Mouth Daily For Fluid Retention/swelling 19)  Potassium Chloride 20 Meq Pack (Potassium Chloride) .... One Tab By Mouth Daily With Furosemide 20)  Acetaminophen 325 Mg  Tabs (Acetaminophen) .... Take 2 Three Times A Day 21)  Bd Pen Needle Short U/f 31g X 8 Mm Misc (Insulin Pen Needle) .... Dispense Enough For Once Daily Dosing. 22)  Victoza 18 Mg/70ml Soln (Liraglutide) .... Inject Once Daily.  Dispense One Month Supply.  Allergies (verified): No Known Drug Allergies  Social History: Smoking Status:  quit   Impression & Recommendations:  Problem # 1:  DIABETES MELLITUS, TYPE II, UNCONTROLLED (ICD-250.02) Assessment Improved  Diabetes of: longstanding duration currently under excellent control of blood glucose based on CBGs of:  90-140 following addition  of Victoza and increase in exercise regimen to twice daily.   Denies hypoglycemic events. Discussed potential need to decrease insulin in the near future.  Able to verbalize appropriate hypoglycemia management plan.   Continued basal insulin Lantus in twice daily dosing 48 units at this visit.   Written pt instructions provided:   F/U to meet new Dr. Tye Savoy in 2 weeks.  F/U Rx Clinic Visit: 6 weeks.  TTFFC: 20 mins.   Her updated medication list for this problem includes:    Aspirin Ec 81 Mg Tbec (Aspirin) .Marland Kitchen... Take 1 tablet by mouth once a day    Quinapril-hydrochlorothiazide 20-25 Mg Tabs  (Quinapril-hydrochlorothiazide) ..... One tab by mouth daily    Glipizide 10 Mg Tabs (Glipizide) .Marland Kitchen... 1 tablet by mouth two times a day    Metformin Hcl 500 Mg Tabs (Metformin hcl) ..... One tab by mouth bid    Lantus 100 Unit/ml Soln (Insulin glargine) .Marland KitchenMarland KitchenMarland KitchenMarland Kitchen 48 units twice a day. dispense qs for 3 month supply.    Victoza 18 Mg/84ml Soln (Liraglutide) ..... Inject once daily.  dispense one month supply.    Her updated medication list for this problem includes:    Aspirin Ec 81 Mg Tbec (Aspirin) .Marland Kitchen... Take 1 tablet by mouth once a day    Quinapril-hydrochlorothiazide 20-25 Mg Tabs (Quinapril-hydrochlorothiazide) ..... One tab by mouth daily    Glipizide 10 Mg Tabs (Glipizide) .Marland Kitchen... 1 tablet by mouth two times a day    Metformin Hcl 500 Mg Tabs (Metformin hcl) ..... One tab by mouth bid    Lantus 100 Unit/ml Soln (Insulin glargine) .Marland KitchenMarland KitchenMarland KitchenMarland Kitchen 48 units twice a day. dispense qs for 3 month supply.    Victoza 18 Mg/17ml Soln (Liraglutide) ..... Inject once daily.  dispense one month supply.  Orders: Reassessment Each 15 min unit- [facility] (76195)  Problem # 2:  HYPERCHOLESTEROLEMIA (ICD-272.0) Assessment: Unchanged  Needs reevaluation of cholesterol panel  at next blood draw.  Appears adherent to statin tx.  Suggest lab in two weeks.  Her updated medication list for this problem includes:    Simvastatin 40 Mg Tabs (Simvastatin) ..... One tab by mouth qhs    Her updated medication list for this problem includes:    Simvastatin 40 Mg Tabs (Simvastatin) ..... One tab by mouth qhs  Orders: Reassessment Each 15 min unit- [facility] (09326)  Problem # 3:  OBESITY, NOS (ICD-278.00) Assessment: Improved  Loss of 2 pounds in the last 2 weeks.   Praised for increase in exercise.  Asked for plan to continue walking AND goal weight loss of at least 1 pound per week.  Patient agreed to walking plan of 30 minutes twice daily.  Acetaminophen has helped with knee pain.    [id number]  Complete Medication List: 1)  Aspirin Ec 81 Mg Tbec (Aspirin) .... Take 1 tablet by mouth once a day 2)  Caltrate 600+d Plus 600-400 Mg-unit Tabs (Calcium carbonate-vit d-min) .... Take 1 tablet by mouth twice a day 3)  Quinapril-hydrochlorothiazide 20-25 Mg Tabs (Quinapril-hydrochlorothiazide) .... One tab by mouth daily 4)  Glipizide 10 Mg Tabs (Glipizide) .Marland Kitchen.. 1 tablet by mouth two times a day 5)  Nitroglycerin 0.4 Mg Subl (Nitroglycerin) .... Place 1 tablet under tongue as directed 6)  Prodigy Blood Glucose Test Strp (Glucose blood) .... Use as directed. 7)  Flexeril 10 Mg Tabs (Cyclobenzaprine hcl) .Marland Kitchen.. 1 tab by mouth at bedtime as needed for back pain 8)  Flonase  50 Mcg/act Susp (Fluticasone propionate) .Marland Kitchen.. 1-2 sprays each nostril two times a day as needed for allergy symptoms 9)  Metformin Hcl 500 Mg Tabs (Metformin hcl) .... One tab by mouth bid 10)  Simvastatin 40 Mg Tabs (Simvastatin) .... One tab by mouth qhs 11)  Loratadine 10 Mg Tabs (Loratadine) .... One tab by mouth daily 12)  Amitriptyline Hcl 100 Mg Tabs (Amitriptyline hcl) .Marland Kitchen.. 1 tablet by mouth at bedtime 13)  Gabapentin 100 Mg Caps (Gabapentin) .... One tab by mouth tid 14)  Lantus 100 Unit/ml Soln (Insulin glargine) .... 48 units twice a day. dispense qs for 3 month supply. 15)  Bd Insulin Syringe Ultrafine 30g X 1/2" 1 Ml Misc (Insulin syringe-needle u-100) .... Dispence quantity sufficient for once daily injection.  dispense one box. 16)  Metoprolol Tartrate 50 Mg Tabs (Metoprolol tartrate) .... One tab by mouth bid 17)  Meloxicam 15 Mg Tabs (Meloxicam) .... One tablet daily with food for pain 18)  Furosemide 20 Mg Tabs (Furosemide) .... One tab by mouth daily for fluid retention/swelling 19)  Potassium Chloride 20 Meq Pack (Potassium chloride) .... One tab by mouth daily with furosemide 20)  Acetaminophen 325 Mg Tabs (Acetaminophen) .... Take 2 three times a day 21)  Bd Pen Needle Short U/f 31g X 8 Mm Misc  (Insulin pen needle) .... Dispense enough for once daily dosing. 22)  Victoza 18 Mg/54ml Soln (Liraglutide) .... Inject once daily.  dispense one month supply.  Patient Instructions: 1)  Keep up the great work! 2)  Walk 7 days a week for 30 minutes.  3)  No change in your diabetes medicine today.  4)  F/U with Dr. Tye Savoy on 7/28  5)  F/U in Rx clinic in late August.   Prevention & Chronic Care Immunizations   Influenza vaccine: Fluvax Non-MCR  (12/27/2008)   Influenza vaccine deferral: Not indicated  (04/09/2009)   Influenza vaccine due: 01/16/2009    Tetanus booster: 04/04/2008: Tdap   Tetanus booster due: 04/04/2018    Pneumococcal vaccine: Pneumovax  (12/27/2008)   Pneumococcal vaccine due: 03/23/2023  Colorectal Screening   Hemoccult: abnormal  (04/06/2008)   Hemoccult due: Not Indicated    Colonoscopy: Location:  [facility].    (05/09/2008)   Colonoscopy due: 05/09/2018  Other Screening   Pap smear: normal  (06/24/2006)   Pap smear due: 06/23/2009    Mammogram: ASSESSMENT: Negative - BI-RADS 1^MM DIGITAL SCREENING  (12/13/2008)   Mammogram due: 12/15/2008   Smoking status: quit  (10/02/2009)  Diabetes Mellitus   HgbA1C: 11.8  (07/22/2009)   Hemoglobin A1C due: 09/26/2008    Eye exam: normal  (02/06/2008)   Eye exam due: 02/05/2009    Foot exam: yes  (12/27/2008)   High risk foot: Not documented   Foot care education: Not documented   Foot exam due: 06/25/2009    Urine microalbumin/creatinine ratio: Not documented   Urine microalbumin/cr due: 06/27/2009    Diabetes flowsheet reviewed?: Yes   Progress toward A1C goal: Improved  Lipids   Total Cholesterol: 127  (06/27/2008)   LDL: 56  (06/27/2008)   LDL Direct: 66  (04/09/2009)   HDL: 48  (06/27/2008)   Triglycerides: 115  (06/27/2008)    SGOT (AST): 15  (08/01/2009)   SGPT (ALT): 20  (08/01/2009)   Alkaline phosphatase: 85  (08/01/2009)   Total bilirubin: 0.3  (08/01/2009)     Lipid flowsheet reviewed?: Yes   Progress toward LDL goal: Unchanged  Hypertension  Last Blood Pressure: 133 / 79  (10/02/2009)   Serum creatinine: 0.83  (09/09/2009)   Serum potassium 4.4  (09/09/2009)    Hypertension flowsheet reviewed?: Yes   Progress toward BP goal: At goal  Self-Management Support :   Personal Goals (by the next clinic visit) :     Personal A1C goal: 8  (11/28/2008)     Personal blood pressure goal: 130/80  (11/28/2008)     Personal LDL goal: 100  (11/28/2008)     Home glucose monitoring frequency: 2 times a day  (11/28/2008)    Diabetes self-management support: CBG self-monitoring log, Written self-care plan  (10/02/2009)   Diabetes care plan printed    Diabetes self-management support not done because: Good outcomes  (10/02/2009)    Hypertension self-management support: Written self-care plan  (10/02/2009)   Hypertension self-care plan printed.    Hypertension self-management support not done because: Good outcomes  (10/02/2009)    Lipid self-management support: Written self-care plan  (10/02/2009)   Lipid self-care plan printed.    Lipid self-management support not done because: Good outcomes  (10/02/2009)

## 2010-04-21 NOTE — Miscellaneous (Signed)
Summary: Received refill request from pharmacy for Victoza  Does this pt need a new script?  Clinical Lists Changes received refill request from pharmacy for Victoza.  it is noted on 10/31/2009 Dr. Raymondo Band gave her an rx for this and noted dispense 3 month supply then  # 1 and 3 refills. called pharmacist and updated this information they did not get the message about despensing 3 month supply for each RX. Theresia Lo RN  January 28, 2010 12:21 PM

## 2010-04-21 NOTE — Progress Notes (Signed)
Summary: refill  Phone Note Refill Request Call back at [phone] Message from:  Patient  Refills Requested: Medication #1:  VICTOZA 18 MG/3ML SOLN Inject once daily. Dose is 1.2 daily.  dispense 3 month supply.. pt is out and needs today  Initial call taken by: De Nurse,  February 11, 2010 11:37 AM  Follow-up for Phone Call        patient notified that RX has been sent  to pharmacy and approved by [facility]. . Follow-up by: Theresia Lo RN,  February 11, 2010 11:59 AM

## 2010-04-21 NOTE — Assessment & Plan Note (Signed)
Summary: Diabetes - Lantus Start - Rx   Vital Signs:  Patient profile:   50 year old female Height:      62 inches Weight:      271 pounds BMI:     49.75 Pulse rate:   120 / minute BP sitting:   118 / 85  (right arm)  Primary Care Provider:  Lequita Asal  MD   History of Present Illness: BG history - 7:30 am reading of 210, patient reports BGs in the 200s and 300s  Mostly running in the 200s, occasionally in the 300s (2-3x/month), lowest readings in previous month of 180.  Patient is asymptomatic, except for dry mouth.    Diet:  pt does not drink soda or koolaid, mostly water (drinks 6-7 bottles/day) brkfast:  wheat bread, bacon, eggs, fruit lunch:  salads Patient does not eat fried food dinner:  small dinners, many sandwiches, bake meats  exercise:  pt reports back problems, walk outside to [employer] (1.5 miles) every other day, patient sometimes jogs in place.  Patient is attempting to lose weight and has a goal in mind.    history of diabetes for 3-4 years  Patient is apprehensive about starting insulin therapy.  Her concerns are due to witnessing her sister suffer complications of diabetes (which she thinks were due to start of insulin therapy).  Patient is also scared of giving herself an injection.    Patient is wililng to work on weight loss.  She states her highest weight has been 275 pounds and her goal weight would be 175 pounds.      Current Medications (verified): 1)  Aspirin Ec 81 Mg Tbec (Aspirin) .... Take 1 Tablet By Mouth Once A Day 2)  Caltrate 600+d Plus 600-400 Mg-Unit Tabs (Calcium Carbonate-Vit D-Min) .... Take 1 Tablet By Mouth Twice A Day 3)  Quinapril-Hydrochlorothiazide 20-25 Mg Tabs (Quinapril-Hydrochlorothiazide) .... One Tab By Mouth Daily 4)  Glipizide 10 Mg  Tabs (Glipizide) .Marland Kitchen.. 1 Tablet By Mouth Two Times A Day 5)  Nitroglycerin 0.4 Mg Subl (Nitroglycerin) .... Place 1 Tablet Under Tongue As Directed 6)  Onetouch Test  Strp (Glucose Blood)  .Marland Kitchen.. 1 Strip Every Morning, 1 Box 7)  Flexeril 10 Mg  Tabs (Cyclobenzaprine Hcl) .Marland Kitchen.. 1 Tab By Mouth At Bedtime As Needed For Back Pain 8)  Flonase 50 Mcg/act  Susp (Fluticasone Propionate) .Marland Kitchen.. 1-2 Sprays Each Nostril Two Times A Day As Needed For Allergy Symptoms 9)  Metformin Hcl 1000 Mg Tabs (Metformin Hcl) .Marland Kitchen.. 1 Tablet By Mouth Two Times A Day With Food 10)  Simvastatin 40 Mg Tabs (Simvastatin) .... One Tab By Mouth Qhs 11)  Loratadine 10 Mg Tabs (Loratadine) .... One Tab By Mouth Daily 12)  Amitriptyline Hcl 100 Mg Tabs (Amitriptyline Hcl) .Marland Kitchen.. 1 Tablet By Mouth At Bedtime 13)  Gabapentin 100 Mg Caps (Gabapentin) .... One Tab By Mouth Tid 14)  Vesicare 10 Mg Tabs (Solifenacin Succinate) .... One Tab By Mouth Daily 15)  Lantus 100 Unit/ml Soln (Insulin Glargine) .... Inject 15 Units Daily and Then Adjust As Directed.  Increase By 1 Unit Daily If Am Fasting Blood Glucose >100.    Dispense One Month Supply. 16)  Bd Insulin Syringe Ultrafine 30g X 1/2" 1 Ml Misc (Insulin Syringe-Needle U-100) .... Dispence Quantity Sufficient For Once Daily Injection.  Dispense One Box.  Allergies (verified): No Known Drug Allergies   Impression & Recommendations:  Problem # 1:  DIABETES MELLITUS, TYPE II, UNCONTROLLED (ICD-250.02) Assessment Deteriorated  Diabetes  of: 3-4  yrs duration currently under: poor control of blood glucose based on A1C of: 14 with fasting CBGs ranging from 200s to 300s.  Control is suboptimal due to probable insulin resistence due to poor weight control.  Denies hypoglycemic events.  Have discussed A1c value with patient as well as macro and microvascular complications associated with uncontrolled diabetes.  Patient confirmed an understanding of this.  Provided injection technique education as well as hypoglycemia management.  Patient demonstrated her ability to inject insulin and has given herself a first lantus dose today in office.  Able to verbalize appropriate hypoglycemia  management plan.  Initiated basal insulin Lantus of 15 units.    Pt will continue to titrate 1 unit if fasting CBGs > 100 until fasting CBGs reach goal.  Written pt instructions provided.   F/U Rx Clinic Visit:  2-3 weeks   TTFFC: 45 mins.     Pt seen with Massie Bougie, PharmD Candidate, Eda Keys, PharmD Resident  Her updated medication list for this problem includes:    Aspirin Ec 81 Mg Tbec (Aspirin) .Marland Kitchen... Take 1 tablet by mouth once a day    Quinapril-hydrochlorothiazide 20-25 Mg Tabs (Quinapril-hydrochlorothiazide) ..... One tab by mouth daily    Glipizide 10 Mg Tabs (Glipizide) .Marland Kitchen... 1 tablet by mouth two times a day    Metformin Hcl 1000 Mg Tabs (Metformin hcl) .Marland Kitchen... 1 tablet by mouth two times a day with food    Lantus 100 Unit/ml Soln (Insulin glargine) ..... Inject 15 units daily and then adjust as directed.  increase by 1 unit daily if am fasting blood glucose >100.    dispense one month supply.  Orders: Inital Assessment Each - [facility] [phone])  Problem # 2:  OBESITY, NOS (ICD-278.00) Assessment: Unchanged Patient willing to work on weight loss.  She was praised for losing 3 pounds.  She is willing to walk more.  Additioanl encouragement for walking longer distance and walking every day should remain focus of routine visits.  She appears to be eating well.   She is able to verbalize  good eating habit changes she has made.   Complete Medication List: 1)  Aspirin Ec 81 Mg Tbec (Aspirin) .... Take 1 tablet by mouth once a day 2)  Caltrate 600+d Plus 600-400 Mg-unit Tabs (Calcium carbonate-vit d-min) .... Take 1 tablet by mouth twice a day 3)  Quinapril-hydrochlorothiazide 20-25 Mg Tabs (Quinapril-hydrochlorothiazide) .... One tab by mouth daily 4)  Glipizide 10 Mg Tabs (Glipizide) .Marland Kitchen.. 1 tablet by mouth two times a day 5)  Nitroglycerin 0.4 Mg Subl (Nitroglycerin) .... Place 1 tablet under tongue as directed 6)  Onetouch Test Strp (Glucose blood) .Marland Kitchen.. 1 strip every morning, 1  box 7)  Flexeril 10 Mg Tabs (Cyclobenzaprine hcl) .Marland Kitchen.. 1 tab by mouth at bedtime as needed for back pain 8)  Flonase 50 Mcg/act Susp (Fluticasone propionate) .Marland Kitchen.. 1-2 sprays each nostril two times a day as needed for allergy symptoms 9)  Metformin Hcl 1000 Mg Tabs (Metformin hcl) .Marland Kitchen.. 1 tablet by mouth two times a day with food 10)  Simvastatin 40 Mg Tabs (Simvastatin) .... One tab by mouth qhs 11)  Loratadine 10 Mg Tabs (Loratadine) .... One tab by mouth daily 12)  Amitriptyline Hcl 100 Mg Tabs (Amitriptyline hcl) .Marland Kitchen.. 1 tablet by mouth at bedtime 13)  Gabapentin 100 Mg Caps (Gabapentin) .... One tab by mouth tid 14)  Vesicare 10 Mg Tabs (Solifenacin succinate) .... One tab by mouth daily 15)  Lantus  100 Unit/ml Soln (Insulin glargine) .... Inject 15 units daily and then adjust as directed.  increase by 1 unit daily if am fasting blood glucose >100.    dispense one month supply. 16)  Bd Insulin Syringe Ultrafine 30g X 1/2" 1 Ml Misc (Insulin syringe-needle u-100) .... Dispence quantity sufficient for once daily injection.  dispense one box.  Patient Instructions: 1)  Keep up the great work you are doing on your diet. 2)  Try to continue increasing your exercise.  3)  Start the Lantus Insulin one shot each morning.   Today you used 15 units.  Please add one unit of insulin each day if your morning blood sugar.  4)  Follow Up with Dr. Lanier Prude in 2-3 weeks.  Prescriptions: BD INSULIN SYRINGE ULTRAFINE 30G X 1/2" 1 ML MISC (INSULIN SYRINGE-NEEDLE U-100) Dispence quantity sufficient for once daily injection.  Dispense one box.  #1 x 11   Entered by:   Christian Mate D   Authorized by:   Lequita Asal  MD   Signed by:   Madelon Lips Pharm D on 04/11/2009   Method used:   [facility]        [facility] [address] [phone]* (retail)       [address]       [place], [place]  [address]       Ph: [phone] or [phone]       Fax: [phone]   RxID:    [id number]   Prevention & Chronic Care Immunizations   Influenza vaccine: Fluvax Non-MCR  (12/27/2008)   Influenza vaccine deferral: Not indicated  (04/09/2009)   Influenza vaccine due: 01/16/2009    Tetanus booster: 04/04/2008: Tdap   Tetanus booster due: 04/04/2018    Pneumococcal vaccine: Pneumovax  (12/27/2008)   Pneumococcal vaccine due: 03/23/2023  Colorectal Screening   Hemoccult: abnormal  (04/06/2008)   Hemoccult due: Not Indicated    Colonoscopy: Location:  [facility].    (05/09/2008)   Colonoscopy due: 05/09/2018  Other Screening   Pap smear: normal  (06/24/2006)   Pap smear due: 06/23/2009    Mammogram: ASSESSMENT: Negative - BI-RADS 1^MM DIGITAL SCREENING  (12/13/2008)   Mammogram due: 12/15/2008   Smoking status: never  (04/09/2009)  Diabetes Mellitus   HgbA1C: >14.0  (04/09/2009)   Hemoglobin A1C due: 09/26/2008    Eye exam: normal  (02/06/2008)   Eye exam due: 02/05/2009    Foot exam: yes  (12/27/2008)   High risk foot: Not documented   Foot care education: Not documented   Foot exam due: 06/25/2009    Urine microalbumin/creatinine ratio: Not documented   Urine microalbumin/cr due: 06/27/2009    Diabetes flowsheet reviewed?: Yes   Progress toward A1C goal: Deteriorated  Lipids   Total Cholesterol: 127  (06/27/2008)   LDL: 56  (06/27/2008)   LDL Direct: 66  (04/09/2009)   HDL: 48  (06/27/2008)   Triglycerides: 115  (06/27/2008)    SGOT (AST): 14  (12/27/2008)   SGPT (ALT): 20  (12/27/2008)   Alkaline phosphatase: 85  (12/27/2008)   Total bilirubin: 0.4  (12/27/2008)    Lipid flowsheet reviewed?: Yes   Progress toward LDL goal: At goal  Hypertension   Last Blood Pressure: 118 / 85  (04/11/2009)   Serum creatinine: 0.90  (12/27/2008)   Serum potassium 3.8  (12/27/2008)    Hypertension flowsheet reviewed?: Yes   Progress toward BP goal: At goal  Self-Management Support :   Personal Goals (by the next clinic  visit) :     Personal A1C goal: 8  (11/28/2008)     Personal blood pressure goal: 130/80  (11/28/2008)     Personal LDL goal: 100  (11/28/2008)     Home glucose monitoring frequency: 2 times a day  (11/28/2008)    Diabetes self-management support: CBG self-monitoring log, Written self-care plan  (04/11/2009)   Diabetes care plan printed    Hypertension self-management support: Written self-care plan  (12/27/2008)    Hypertension self-management support not done because: Good outcomes  (04/11/2009)    Lipid self-management support: Written self-care plan  (12/27/2008)     Lipid self-management support not done because: Good outcomes  (04/11/2009)

## 2010-04-21 NOTE — Progress Notes (Signed)
  Phone Note Call from Patient   Caller: Patient Summary of Call: Patietn states she was started on insulin yesterday.  Has the insulin but not the syringes with needles.  States [facility] was [facility] [address] but they are saying they don't have the prescription.  Checked note - it does appear to be sent.  Unclear what went wrong.  Told patient I would send the Rx for the syringes and insulin again [facility] [address].  Initial call taken by: Lamar Laundry, MD April 12, 2008 09:50AM

## 2010-04-21 NOTE — Assessment & Plan Note (Signed)
Summary: feet swelling,df   Vital Signs:  Patient profile:   50 year old female Weight:      296 pounds Temp:     98.8 degrees F oral Pulse rate:   102 / minute Pulse rhythm:   regular BP sitting:   160 / 97  (left arm) Cuff size:   large  Vitals Entered By: Loralee Pacas CMA (Aug 01, 2009 3:08 PM) CC: feet swelling x 4 days   Primary Care Provider:  Lequita Asal  MD  CC:  feet swelling x 4 days.  History of Present Illness: peripheral edema- present x4 days. improves overnight, worse with activity all day. denies increased salt intake, chest pain, drinking alcohol, recent surgery, or other risk factors for DVT.   Habits & Providers  Alcohol-Tobacco-Diet     Tobacco Status: never  Current Medications (verified): 1)  Aspirin Ec 81 Mg Tbec (Aspirin) .... Take 1 Tablet By Mouth Once A Day 2)  Caltrate 600+d Plus 600-400 Mg-Unit Tabs (Calcium Carbonate-Vit D-Min) .... Take 1 Tablet By Mouth Twice A Day 3)  Quinapril-Hydrochlorothiazide 20-25 Mg Tabs (Quinapril-Hydrochlorothiazide) .... One Tab By Mouth Daily 4)  Glipizide 10 Mg  Tabs (Glipizide) .Marland Kitchen.. 1 Tablet By Mouth Two Times A Day 5)  Nitroglycerin 0.4 Mg Subl (Nitroglycerin) .... Place 1 Tablet Under Tongue As Directed 6)  Prodigy Blood Glucose Test  Strp (Glucose Blood) .... Use As Directed. 7)  Flexeril 10 Mg  Tabs (Cyclobenzaprine Hcl) .Marland Kitchen.. 1 Tab By Mouth At Bedtime As Needed For Back Pain 8)  Flonase 50 Mcg/act  Susp (Fluticasone Propionate) .Marland Kitchen.. 1-2 Sprays Each Nostril Two Times A Day As Needed For Allergy Symptoms 9)  Metformin Hcl 500 Mg Tabs (Metformin Hcl) .... One Tab By Mouth Bid 10)  Simvastatin 40 Mg Tabs (Simvastatin) .... One Tab By Mouth Qhs 11)  Loratadine 10 Mg Tabs (Loratadine) .... One Tab By Mouth Daily 12)  Amitriptyline Hcl 100 Mg Tabs (Amitriptyline Hcl) .Marland Kitchen.. 1 Tablet By Mouth At Bedtime 13)  Gabapentin 100 Mg Caps (Gabapentin) .... One Tab By Mouth Tid 14)  Lantus 100 Unit/ml Soln (Insulin  Glargine) .... 45 Units Twice A Day. Increase As Directed. Disp 3 Vials. 15)  Bd Insulin Syringe Ultrafine 30g X 1/2" 1 Ml Misc (Insulin Syringe-Needle U-100) .... Dispence Quantity Sufficient For Once Daily Injection.  Dispense One Box. 16)  Metoprolol Tartrate 50 Mg Tabs (Metoprolol Tartrate) .... One Tab By Mouth Bid 17)  Meloxicam 15 Mg Tabs (Meloxicam) .... One Tablet Daily With Food For Pain  Allergies (verified): No Known Drug Allergies  Physical Exam  General:  morbidly obese, alert, NAD, cooperative to examination. vitals reviewed Lungs:  Normal respiratory effort, chest expands symmetrically. Lungs are clear to auscultation, no crackles or wheezes. Heart:  Normal rate and regular rhythm. S1 and S2 normal without gallop, murmur, click, rub or other extra sounds. Abdomen:  obese, NT, ND, +BS Extremities:  trace pitting edema of BLE to mid calf   Impression & Recommendations:  Problem # 1:  PERIPHERAL EDEMA (ICD-782.3) Assessment New likely 2/2 venous insufficiency. normal cardiac funtion last summer on ECHO. last CMP with normal kidney and liver function. will repeat today. no risk factors for DVT and swelling bilateral. compression hose. no added salt. start as needed furosemide. repeat BMP at next visit to check K  Her updated medication list for this problem includes:    Quinapril-hydrochlorothiazide 20-25 Mg Tabs (Quinapril-hydrochlorothiazide) ..... One tab by mouth daily    Furosemide 20  Mg Tabs (Furosemide) ..... One tab by mouth daily as needed for fluid retention/swelling  Orders: Comp Met-[facility] ([id number]) [facility]- Est Level  3 (99213)Future Orders: Basic Met-[facility] ([id number]) ... 07/31/2010  Patient Instructions: 1)  Purchase a pair of COMPRESSION STOCKINGS 2)  Keep your feet propped up as much as possible.  3)  Take the FUROSEMIDE as needed for swelling 4)  We will check your labs again when you see Dr. Raymondo Band. Prescriptions: POTASSIUM CHLORIDE 20 MEQ PACK  (POTASSIUM CHLORIDE) one tab by mouth daily with furosemide  #30 x 1   Entered and Authorized by:   Lequita Asal  MD   Signed by:   Lequita Asal  MD on 08/01/2009   Method used:   [facility]        [facility] [address] [phone]* (retail)       [address]       [place], [place]  [address]       Ph: [phone] or [phone]       Fax: [phone]   RxID:   [id number] FUROSEMIDE 20 MG TABS (FUROSEMIDE) one tab by mouth daily as needed for fluid retention/swelling  #30 x 1   Entered and Authorized by:   Lequita Asal  MD   Signed by:   Lequita Asal  MD on 08/01/2009   Method used:   Electronically to        [facility] [address] [phone]* (retail)       [address]       [place], [place]  [address]       Ph: [phone] or [phone]       Fax: [phone]   RxID:   [id number]

## 2010-04-21 NOTE — Assessment & Plan Note (Signed)
Summary: F/U VISITS/[facility]   Vital Signs:  Patient profile:   50 year old female Weight:      280 pounds BMI:     51.40 Temp:     98.7 degrees F oral Pulse rate:   109 / minute Pulse rhythm:   regular BP sitting:   131 / 87  Vitals Entered By: Loralee Pacas CMA (June 06, 2009 10:10 AM)  Nutrition Counseling: Patient's BMI is greater than 25 and therefore counseled on weight management options.  Primary Care Provider:  Lequita Asal  MD  CC:  f/u DM, HTN, and tachycardia.  History of Present Illness: 50 y/o female here for f/u  DM- improved with insulin. up to 40 units of lantus qa.m. continues to have fasting cbgs >190. denies polyuria, polydipsia. has started walking for exercise.   tachycardia- occasional sensation of palpitations. lab w/u negative thus far.   HTN- BP stable. on quinpril-hctz. denies chest pain, SOB, peripheral edema, headaches, blurred vision.   Habits & Providers  Alcohol-Tobacco-Diet     Tobacco Status: quit > 6 months  Current Medications (verified): 1)  Aspirin Ec 81 Mg Tbec (Aspirin) .... Take 1 Tablet By Mouth Once A Day 2)  Caltrate 600+d Plus 600-400 Mg-Unit Tabs (Calcium Carbonate-Vit D-Min) .... Take 1 Tablet By Mouth Twice A Day 3)  Quinapril-Hydrochlorothiazide 20-25 Mg Tabs (Quinapril-Hydrochlorothiazide) .... One Tab By Mouth Daily 4)  Glipizide 10 Mg  Tabs (Glipizide) .Marland Kitchen.. 1 Tablet By Mouth Two Times A Day 5)  Nitroglycerin 0.4 Mg Subl (Nitroglycerin) .... Place 1 Tablet Under Tongue As Directed 6)  Prodigy Blood Glucose Test  Strp (Glucose Blood) .... Use As Directed. 7)  Flexeril 10 Mg  Tabs (Cyclobenzaprine Hcl) .Marland Kitchen.. 1 Tab By Mouth At Bedtime As Needed For Back Pain 8)  Flonase 50 Mcg/act  Susp (Fluticasone Propionate) .Marland Kitchen.. 1-2 Sprays Each Nostril Two Times A Day As Needed For Allergy Symptoms 9)  Metformin Hcl 1000 Mg Tabs (Metformin Hcl) .Marland Kitchen.. 1 Tablet By Mouth Two Times A Day With Food 10)  Simvastatin 40 Mg Tabs (Simvastatin) ....  One Tab By Mouth Qhs 11)  Loratadine 10 Mg Tabs (Loratadine) .... One Tab By Mouth Daily 12)  Amitriptyline Hcl 100 Mg Tabs (Amitriptyline Hcl) .Marland Kitchen.. 1 Tablet By Mouth At Bedtime 13)  Gabapentin 100 Mg Caps (Gabapentin) .... One Tab By Mouth Tid 14)  Vesicare 10 Mg Tabs (Solifenacin Succinate) .... One Tab By Mouth Daily 15)  Lantus 100 Unit/ml Soln (Insulin Glargine) .... Inject 15 Units Daily and Then Adjust As Directed.  Increase By 1 Unit Daily If Am Fasting Blood Glucose >100.    Dispense One Month Supply. 16)  Bd Insulin Syringe Ultrafine 30g X 1/2" 1 Ml Misc (Insulin Syringe-Needle U-100) .... Dispence Quantity Sufficient For Once Daily Injection.  Dispense One Box.  Allergies (verified): No Known Drug Allergies  Social History: Smoking Status:  quit > 6 months  Physical Exam  General:  morbidly obese, alert, NAD, cooperative to examination. vitals reviewed Mouth:  MMM Lungs:  Normal respiratory effort, chest expands symmetrically. Lungs are clear to auscultation, no crackles or wheezes. Heart:  tachycardic and regular rhythm. S1 and S2 normal without gallop, murmur, click, rub or other extra sounds.   Impression & Recommendations:  Problem # 1:  SINUS TACHYCARDIA (ICD-427.89) Assessment Unchanged  given slightly elevated BP, persistent tachycardia, will add metoprolol to regimen.   Her updated medication list for this problem includes:    Aspirin Ec 81 Mg Tbec (Aspirin) .Marland KitchenMarland KitchenMarland KitchenMarland Kitchen  Take 1 tablet by mouth once a day    Metoprolol Tartrate 25 Mg Tabs (Metoprolol tartrate) ..... One tab by mouth bid  Orders: [facility]- Est  Level 4 ([address]109)  Problem # 2:  DIABETES MELLITUS, TYPE II, UNCONTROLLED (ICD-250.02) Assessment: Improved  still not at goal for fasting cbgs. will increase lantus to 45 units and have patient continue to titrate up until fasting nearer to 100. will decrease metformin to 500 mg two times a day since patient c/o GI side effects.   Her updated medication list for  this problem includes:    Aspirin Ec 81 Mg Tbec (Aspirin) .Marland Kitchen... Take 1 tablet by mouth once a day    Quinapril-hydrochlorothiazide 20-25 Mg Tabs (Quinapril-hydrochlorothiazide) ..... One tab by mouth daily    Glipizide 10 Mg Tabs (Glipizide) .Marland Kitchen... 1 tablet by mouth two times a day    Metformin Hcl 500 Mg Tabs (Metformin hcl) ..... One tab by mouth bid    Lantus 100 Unit/ml Soln (Insulin glargine) ..... Inject 15 units daily and then adjust as directed.  increase by 1 unit daily if am fasting blood glucose >100.    dispense one month supply.  Orders: [facility]- Est  Level 4 (60454)  Problem # 3:  HYPERTENSION, BENIGN SYSTEMIC (ICD-401.1) Assessment: Deteriorated  add metoprolol.   Her updated medication list for this problem includes:    Quinapril-hydrochlorothiazide 20-25 Mg Tabs (Quinapril-hydrochlorothiazide) ..... One tab by mouth daily    Metoprolol Tartrate 25 Mg Tabs (Metoprolol tartrate) ..... One tab by mouth bid  Orders: [facility]- Est  Level 4 (09811)  Patient Instructions: 1)  Glad to see you are doing so well!!!! 2)  Increase your Lantus to 45 UNITS each morning. If your fasting blood sugars are over 100, then increase by TWO UNITS every two days. 3)  The new HEART RATE/BLOOD PRESSURE medication is METOPROLOL 25 mg TWICE A DAY 4)  Schedule appt with Dr. Lanier Prude towards beginning of May about diabetes.  Prescriptions: METOPROLOL TARTRATE 25 MG TABS (METOPROLOL TARTRATE) one tab by mouth bid  #60 x 3   Entered and Authorized by:   Lequita Asal  MD   Signed by:   Lequita Asal  MD on 06/06/2009   Method used:   [facility]        [facility] [address] [phone]* (retail)       [address]       [place], [place]  [address]       Ph: [phone] or [phone]       Fax: [phone]   RxID:   [id number] METFORMIN HCL 500 MG TABS (METFORMIN HCL) one tab by mouth bid  #60 x 3   Entered and Authorized by:   Lequita Asal  MD   Signed by:    Lequita Asal  MD on 06/06/2009   Method used:   [facility]        [facility] [address] [phone]* (retail)       [address]       [place], [place]  [address]       Ph: [phone] or [phone]       Fax: [phone]   RxID:   [id number]    Prevention & Chronic Care Immunizations   Influenza vaccine: Fluvax Non-MCR  (12/27/2008)   Influenza vaccine deferral: Not indicated  (04/09/2009)   Influenza vaccine due: 01/16/2009  Tetanus booster: 04/04/2008: Tdap   Tetanus booster due: 04/04/2018    Pneumococcal vaccine: Pneumovax  (12/27/2008)   Pneumococcal vaccine due: 03/23/2023  Colorectal Screening   Hemoccult: abnormal  (04/06/2008)   Hemoccult due: Not Indicated    Colonoscopy: Location:  [facility].    (05/09/2008)   Colonoscopy due: 05/09/2018  Other Screening   Pap smear: normal  (06/24/2006)   Pap smear due: 06/23/2009    Mammogram: ASSESSMENT: Negative - BI-RADS 1^MM DIGITAL SCREENING  (12/13/2008)   Mammogram due: 12/15/2008   Smoking status: quit > 6 months  (06/06/2009)  Diabetes Mellitus   HgbA1C: >14.0  (04/09/2009)   Hemoglobin A1C due: 09/26/2008    Eye exam: normal  (02/06/2008)   Eye exam due: 02/05/2009    Foot exam: yes  (12/27/2008)   High risk foot: Not documented   Foot care education: Not documented   Foot exam due: 06/25/2009    Urine microalbumin/creatinine ratio: Not documented   Urine microalbumin/cr due: 06/27/2009    Diabetes flowsheet reviewed?: Yes   Progress toward A1C goal: Improved  Lipids   Total Cholesterol: 127  (06/27/2008)   LDL: 56  (06/27/2008)   LDL Direct: 66  (04/09/2009)   HDL: 48  (06/27/2008)   Triglycerides: 115  (06/27/2008)    SGOT (AST): 14  (12/27/2008)   SGPT (ALT): 20  (12/27/2008)   Alkaline phosphatase: 85  (12/27/2008)   Total bilirubin: 0.4  (12/27/2008)    Lipid flowsheet reviewed?: Yes   Progress toward LDL goal: At  goal  Hypertension   Last Blood Pressure: 131 / 87  (06/06/2009)   Serum creatinine: 0.90  (12/27/2008)   Serum potassium 3.8  (12/27/2008)    Hypertension flowsheet reviewed?: Yes   Progress toward BP goal: Deteriorated  Self-Management Support :   Personal Goals (by the next clinic visit) :     Personal A1C goal: 8  (11/28/2008)     Personal blood pressure goal: 130/80  (11/28/2008)     Personal LDL goal: 100  (11/28/2008)    Patient will work on the following items until the next clinic visit to reach self-care goals:     Medications and monitoring: take my medicines every day, check my blood sugar  (06/06/2009)     Eating: eat foods that are low in salt, eat fruit for snacks and desserts  (04/09/2009)     Activity: take a 30 minute walk every day  (04/09/2009)     Other: [place] Diet  (11/28/2008)     Home glucose monitoring frequency: 2 times a day  (11/28/2008)    Diabetes self-management support: CBG self-monitoring log, Written self-care plan  (06/06/2009)   Diabetes care plan printed    Hypertension self-management support: Written self-care plan  (06/06/2009)   Hypertension self-care plan printed.    Hypertension self-management support not done because: Good outcomes  (04/11/2009)    Lipid self-management support: Written self-care plan  (12/27/2008)     Lipid self-management support not done because: Good outcomes  (06/06/2009)

## 2010-04-21 NOTE — Assessment & Plan Note (Signed)
Summary: f/u  kh   Vital Signs:  Patient profile:   50 year old female Height:      61 inches Weight:      249.06 pounds BMI:     47.23 Temp:     98.4 degrees F oral Pulse rate:   84 / minute BP sitting:   137 / 85  (left arm) Cuff size:   large  Vitals Entered By: Jimmy Footman, CMA (January 30, 2010 2:54 PM) CC: f/u chronic knee pain Is Patient Diabetic? Yes Did you bring your meter with you today? No Pain Assessment Patient in pain? yes     Location: knees Intensity: 9 Type: sharp   Primary Provider:  Barnabas Lister  MD  CC:  f/u chronic knee pain.  History of Present Illness: 50 yo AAF returns to clinic to f/u chronic knee pain.  L knee worse than R knee pain.  At last visit, Dr. Christella Hartigan and I performed bilateral steroid injections.  Pt says pain improved in R knee, but L knee still in pain.  Pain scale 8/10.  She can hear her knee "pop" when she stands up.  Puts ice and Bengay on L knee, but does not help.  Takes Tylenol two times a day and Meloxicam 15mg  at bedtime which helps decrease the pain to 5/10.  Pt also endorses back pain in which she uses a TENS unit.  Pt is morbidly obese - weighs 294.1lbs.  Says it is "hard to lose" weight.  Walks twice a day for 30 min to 1 hr.  Trying to eat less fried foods and less red meat.  ROS: Endorses L knee pain and low back pain.  Denies CP, N/V, abdominal pain, SOB, falls.  Preventive Screening-Counseling & Management  Alcohol-Tobacco     Smoking Status: quit  Current Medications (verified): 1)  Aspirin Ec 81 Mg Tbec (Aspirin) .... Take 1 Tablet By Mouth Once A Day 2)  Caltrate 600+d Plus 600-400 Mg-Unit Tabs (Calcium Carbonate-Vit D-Min) .... Take 1 Tablet By Mouth Twice A Day 3)  Quinapril-Hydrochlorothiazide 20-25 Mg Tabs (Quinapril-Hydrochlorothiazide) .... One Tab By Mouth Daily 4)  Glipizide 10 Mg  Tabs (Glipizide) .Marland Kitchen.. 1 Tablet By Mouth Two Times A Day 5)  Nitrostat 0.4 Mg Subl (Nitroglycerin) .... Place 1 Under Tongue  If Chest Pain.  If Pain Does Not Resolve in 5 Min Call 911 and Take 2nd.  Take A 3rd Dose If Pain Continues. 6)  Prodigy Blood Glucose Test  Strp (Glucose Blood) .... Use As Directed. 7)  Flexeril 10 Mg  Tabs (Cyclobenzaprine Hcl) .Marland Kitchen.. 1 Tab By Mouth At Bedtime As Needed For Back Pain 8)  Flonase 50 Mcg/act  Susp (Fluticasone Propionate) .Marland Kitchen.. 1-2 Sprays Each Nostril Two Times A Day As Needed For Allergy Symptoms 9)  Metformin Hcl 500 Mg Tabs (Metformin Hcl) .... One Tab By Mouth Bid 10)  Simvastatin 40 Mg Tabs (Simvastatin) .... One Tab By Mouth Qhs 11)  Loratadine 10 Mg Tabs (Loratadine) .... One Tab By Mouth Daily 12)  Amitriptyline Hcl 100 Mg Tabs (Amitriptyline Hcl) .Marland Kitchen.. 1 Tablet By Mouth At Bedtime 13)  Gabapentin 100 Mg Caps (Gabapentin) .... One Tab By Mouth Four Times Daily 14)  Lantus 100 Unit/ml Soln (Insulin Glargine) .... 48 Units Twice A Day. Dispense Qs For 3 Month Supply. 15)  Bd Insulin Syringe Ultrafine 30g X 1/2" 1 Ml Misc (Insulin Syringe-Needle U-100) .... Dispence Quantity Sufficient For Twice Daily Injection.  Dispense One Box. 16)  Metoprolol Tartrate 50 Mg Tabs (Metoprolol Tartrate) .... One Tab By Mouth Bid 17)  Meloxicam 15 Mg Tabs (Meloxicam) .... One Tablet Daily With Food For Pain 18)  Furosemide 20 Mg Tabs (Furosemide) .... One Tab By Mouth Daily For Fluid Retention/swelling 19)  Potassium Chloride 20 Meq Pack (Potassium Chloride) .... One Tab By Mouth Daily With Furosemide 20)  Acetaminophen 325 Mg  Tabs (Acetaminophen) .... Take 1 Twice Daily 21)  Bd Pen Needle Short U/f 31g X 8 Mm Misc (Insulin Pen Needle) .... Dispense Enough For Once Daily Dosing. 22)  Victoza 18 Mg/65ml Soln (Liraglutide) .... Inject Once Daily. Dose Is 1.2 Daily.  Dispense 3 Month Supply.  Allergies (verified): No Known Drug Allergies  Physical Exam  General:  alert and cooperative, no acute distress Lungs:  Normal respiratory effort, chest expands symmetrically. Lungs are clear to  auscultation, no crackles or wheezes. Heart:  Normal rate and regular rhythm. S1 and S2 normal without gallop, murmur, click, rub or other extra sounds. Msk:  L knee - joint tenderness, no joint swelling, no joint warmth, and decreased ROM. R knee - no joint tenderness, no joint swelling, no joint warmth, and decreased ROM.   Extremities:  trace left pedal edema bil.   Impression & Recommendations:  Problem # 1:  KNEE PAIN, LEFT (ICD-719.46) Assessment Unchanged Pt says steroid injections were not helpful.  Will continue pt on Meloxicam and Tylenol for pain.  Discussed with pt that we can only hope to decrease her pain to 3 or 4 out of 10.  She will need to lose weight for best results.  Advised to continue applying ice or heating pads to affected areas.  Will see pt back in 6 months.  Her updated medication list for this problem includes:    Aspirin Ec 81 Mg Tbec (Aspirin) .Marland Kitchen... Take 1 tablet by mouth once a day    Flexeril 10 Mg Tabs (Cyclobenzaprine hcl) .Marland Kitchen... 1 tab by mouth at bedtime as needed for back pain    Meloxicam 15 Mg Tabs (Meloxicam) ..... One tablet daily with food for pain    Acetaminophen 325 Mg Tabs (Acetaminophen) .Marland Kitchen... Take 1 twice daily  Problem # 2:  OBESITY, NOS (ICD-278.00) Assessment: Unchanged In last year, pt has gained >20 lbs.  Discussed the importance of weight loss to improve chronic knee pain.  Pt says she has seen a nutritionist in the past.  She would like to meet with Dr. Gerilyn Pilgrim to discuss weight managment.  I will flag Dr. Gerilyn Pilgrim to make an appt with pt.   Will see pt back in 6 months.  Orders: [facility]- Est Level  3 ([address]40)  Patient Instructions: 1)  It was good to see you today. 2)  Please continue to put ice or heating pads on L knee in addition to the pain medications you take. 3)  I will let the nutritionist, Dr. Gerilyn Pilgrim, know that you are interested in weight management.  She will call you for an appointment. 4)  Please call MD if your pain becomes  unbearable or if you have any falls. 5)  Please schedule an appointment with me in 6 months.  We may need to draw blood work at that time. 6)  THank you. Prescriptions: FLONASE 50 MCG/ACT  SUSP (FLUTICASONE PROPIONATE) 1-2 sprays each nostril two times a day as needed for allergy symptoms  #1 x 6   Entered and Authorized by:   Ivy de Lawson Radar  MD   Signed by:  Barnabas Lister  MD on 01/30/2010   Method used:   [facility]        [facility] [address] [phone]* (retail)       [address]       [place], [place]  [address]       Ph: [phone] or [phone]       Fax: [phone]   RxID:   [id number] FLONASE 50 MCG/ACT  SUSP (FLUTICASONE PROPIONATE) 1-2 sprays each nostril two times a day as needed for allergy symptoms  #1 x 6   Entered and Authorized by:   Ivy de Lawson Radar  MD   Signed by:   Barnabas Lister  MD on 01/30/2010   Method used:   [facility] to        [facility] [address] [phone]* (retail)       [address]       [place], [place]  [address]       Ph: [phone] or [phone]       Fax: [phone]   RxID:   [id number]    Orders Added: 1)  [facility]- Est Level  3 [10626]

## 2010-04-21 NOTE — Progress Notes (Signed)
Summary: triage  Phone Note Call from Patient Call back at [phone]   Caller: Patient Summary of Call: Having some knee pain. Initial call taken by: Clydell Hakim,  August 25, 2009 1:32 PM  Follow-up for Phone Call        LM Follow-up by: Golden Circle RN,  August 25, 2009 2:01 PM  Additional Follow-up for Phone Call Additional follow up Details #1::        LM Additional Follow-up by: Golden Circle RN,  August 25, 2009 3:33 PM    Additional Follow-up for Phone Call Additional follow up Details #2::    Pt returning Sally's call from yesterday. Follow-up by: Clydell Hakim,  August 26, 2009 8:46 AM  Additional Follow-up for Phone Call Additional follow up Details #3:: Details for Additional Follow-up Action Taken: L knee pain. pain is worse. 10/10. meds not helping. will come within the next 30 minutes to see work in md. pt is aware her pcp is not here Additional Follow-up by: Golden Circle RN,  August 26, 2009 8:49 AM

## 2010-04-21 NOTE — Assessment & Plan Note (Signed)
Summary: knee pain   Vital Signs:  Patient profile:   50 year old female Height:      61 inches Weight:      285 pounds BMI:     54.04 Temp:     99.2 degrees F Pulse rate:   131 / minute BP sitting:   141 / 89  (left arm) Cuff size:   large  Vitals Entered By: Dennison Nancy RN (July 07, 2009 4:47 PM)  Nutrition Counseling: Patient's BMI is greater than 25 and therefore counseled on weight management options. CC: Bilateral knee pain Is Patient Diabetic? Yes Pain Assessment Patient in pain? yes     Location: knees Intensity: 9 Onset of pain  3 weeks ago   Primary Care Provider:  Lequita Asal  MD  CC:  Bilateral knee pain.  History of Present Illness: 50 y/o female here for eval   bilateral knee pain- over last several days/weeks. worse with activity. no swelling, erythema. no known h/o DJD. patient has gained 14 lbs since January. has tried 400 mg ibuprofen with some relief.    tachycardia- sinus tach noted on prior EKG. work up thus far unremarkable. no anemia or hyperthyroidism. occasional associated chest pains and awareness of fast heart rate. improvement with metoprolol.   Habits & Providers  Alcohol-Tobacco-Diet     Tobacco Status: never  Current Medications (verified): 1)  Aspirin Ec 81 Mg Tbec (Aspirin) .... Take 1 Tablet By Mouth Once A Day 2)  Caltrate 600+d Plus 600-400 Mg-Unit Tabs (Calcium Carbonate-Vit D-Min) .... Take 1 Tablet By Mouth Twice A Day 3)  Quinapril-Hydrochlorothiazide 20-25 Mg Tabs (Quinapril-Hydrochlorothiazide) .... One Tab By Mouth Daily 4)  Glipizide 10 Mg  Tabs (Glipizide) .Marland Kitchen.. 1 Tablet By Mouth Two Times A Day 5)  Nitroglycerin 0.4 Mg Subl (Nitroglycerin) .... Place 1 Tablet Under Tongue As Directed 6)  Prodigy Blood Glucose Test  Strp (Glucose Blood) .... Use As Directed. 7)  Flexeril 10 Mg  Tabs (Cyclobenzaprine Hcl) .Marland Kitchen.. 1 Tab By Mouth At Bedtime As Needed For Back Pain 8)  Flonase 50 Mcg/act  Susp (Fluticasone Propionate)  .Marland Kitchen.. 1-2 Sprays Each Nostril Two Times A Day As Needed For Allergy Symptoms 9)  Metformin Hcl 500 Mg Tabs (Metformin Hcl) .... One Tab By Mouth Bid 10)  Simvastatin 40 Mg Tabs (Simvastatin) .... One Tab By Mouth Qhs 11)  Loratadine 10 Mg Tabs (Loratadine) .... One Tab By Mouth Daily 12)  Amitriptyline Hcl 100 Mg Tabs (Amitriptyline Hcl) .Marland Kitchen.. 1 Tablet By Mouth At Bedtime 13)  Gabapentin 100 Mg Caps (Gabapentin) .... One Tab By Mouth Tid 14)  Lantus 100 Unit/ml Soln (Insulin Glargine) .... Inject 15 Units Daily and Then Adjust As Directed.  Increase By 1 Unit Daily If Am Fasting Blood Glucose >100.    Dispense One Month Supply. 15)  Bd Insulin Syringe Ultrafine 30g X 1/2" 1 Ml Misc (Insulin Syringe-Needle U-100) .... Dispence Quantity Sufficient For Once Daily Injection.  Dispense One Box. 16)  Metoprolol Tartrate 25 Mg Tabs (Metoprolol Tartrate) .... One Tab By Mouth Bid  Allergies (verified): No Known Drug Allergies  Social History: Smoking Status:  never  Physical Exam  General:  morbidly obese, alert, NAD, cooperative to examination. vitals reviewed Lungs:  Normal respiratory effort, chest expands symmetrically. Lungs are clear to auscultation, no crackles or wheezes. Heart:  tachycardic and regular rhythm. S1 and S2 normal without gallop, murmur, click, rub or other extra sounds.   Knee Exam  Knee Exam:  Right:    Inspection:  Normal    Palpation:  Normal    Stability:  stable    Tenderness:  along medial and lateral aspects of joint line    Swelling:  no    Erythema:  no    Range of Motion:       Flexion-Active: full       Extension-Active: full       Flexion-Passive: full       Extension-Passive: full    Left:    Inspection:  Normal    Palpation:  Normal    Stability:  stable    Tenderness:  along medial and lateral aspects of joint line    Swelling:  no    Erythema:  no    Range of Motion:       Flexion-Active: full       Extension-Active: full        Flexion-Passive: full       Extension-Passive: full   Impression & Recommendations:  Problem # 1:  KNEE PAIN, BILATERAL (ICD-719.46) Assessment New  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 patient with DM, so will hold off on injection for right now. trial of meloxicam.   Her updated medication list for this problem includes:    Aspirin Ec 81 Mg Tbec (Aspirin) .Marland Kitchen... Take 1 tablet by mouth once a day    Flexeril 10 Mg Tabs (Cyclobenzaprine hcl) .Marland Kitchen... 1 tab by mouth at bedtime as needed for back pain    Meloxicam 15 Mg Tabs (Meloxicam) ..... One tablet daily with food for pain  Orders: [facility]- Est Level  3 (04540)  Problem # 2:  SINUS TACHYCARDIA (ICD-427.89) Assessment: Unchanged  increase metoprolol.  Her updated medication list for this problem includes:    Aspirin Ec 81 Mg Tbec (Aspirin) .Marland Kitchen... Take 1 tablet by mouth once a day    Metoprolol Tartrate 50 Mg Tabs (Metoprolol tartrate) ..... One tab by mouth bid  Orders: [facility]- Est Level  3 (98119)  Other Orders: Ketorolac-Toradol 15mg  (J4782) Prescriptions: MELOXICAM 15 MG TABS (MELOXICAM) one tablet daily with food for pain  #30 x 2   Entered and Authorized by:   Lequita Asal  MD   Signed by:   Lequita Asal  MD on 07/07/2009   Method used:   Electronically to        [facility] [address] [phone]* (retail)       [address]       [place], [place]  [address]       Ph: [phone] or [phone]       Fax: [phone]   RxID:   [id number] METOPROLOL TARTRATE 50 MG TABS (METOPROLOL TARTRATE) one tab by mouth bid  #60 x 3   Entered and Authorized by:   Lequita Asal  MD   Signed by:   Lequita Asal  MD on 07/07/2009   Method used:   Electronically to        [facility] [address] [phone]* (retail)       [address]       [place], [place]  [address]       Ph: [phone] or [phone]       Fax: [phone]   RxID:    [id number]    Medication Administration  Injection # 1:    Medication: Ketorolac-Toradol 15mg   Diagnosis: HIP PAIN, BILATERAL (ICD-719.45)    Route: IM    Site: LUOQ gluteus    Exp Date: 04/11/2011    Lot #: [id number]    Mfr: Nova PLus    Comments: Administered 30 mg    Patient tolerated injection without complications    Given by: Dennison Nancy RN (July 07, 2009 5:11 PM)  Orders Added: 1)  Ketorolac-Toradol 15mg  [[id number]] 2)  [facility]- Est Level  3 [[address]846]

## 2010-04-21 NOTE — Miscellaneous (Signed)
  Clinical Lists Changes  Medications: Rx of FUROSEMIDE 20 MG TABS (FUROSEMIDE) one tab by mouth daily for fluid retention/swelling;  #30 x 0;  Signed;  Entered by: Tiffane Sheldon de Lawson Radar  MD;  Authorized by: Lilia Letterman de Lawson Radar  MD;  Method used: Electronically to [facility] [address] [phone]*, [address] [place], [place], [place]  [address], Ph: [phone] or [phone], Fax: [phone]    Prescriptions: FUROSEMIDE 20 MG TABS (FUROSEMIDE) one tab by mouth daily for fluid retention/swelling  #30 x 0   Entered and Authorized by:   Carter Kaman de Lawson Radar  MD   Signed by:   Barnabas Lister  MD on 02/17/2010   Method used:   [facility] to        [facility] [address] [phone]* (retail)       [address]       [place], [place]  [address]       Ph: [phone] or [phone]       Fax: [phone]   RxID:   [id number]

## 2010-04-21 NOTE — Consult Note (Signed)
Summary: [facility]  [facility]   Imported By: Clydell Hakim 04/18/2009 13:43:12  _____________________________________________________________________  External Attachment:    Type:   Image     Comment:   External Document

## 2010-04-21 NOTE — Assessment & Plan Note (Signed)
Summary: reck leg,df   Vital Signs:  Patient profile:   50 year old female Weight:      294.5 pounds Temp:     99.5 degrees F oral Pulse rate:   101 / minute Pulse rhythm:   regular BP sitting:   123 / 80  (left arm) Cuff size:   large  Vitals Entered By: Loralee Pacas CMA (November 12, 2009 2:55 PM) CC: follow-up visit   Primary Provider:  Barnabas Lister  MD  CC:  follow-up visit.  History of Present Illness: This is a 50 year old F with PMH DM2, HTN who is here for a knee pain f/u from 10/14/09.  Patient has had knee pain for the last 1-2 months.  The L knee is worse than the R.  It is a sharp, stabbing pain that comes and goes.  It is worse in the am when she gets out of bed and improves throughout the day.  She takes Mobic, Tylenol, and Flexeril 1-2x a day, which relieves the pain temporarily.  Patient says she hears a "popping" noise at times.  She was seen by Dr. Clotilde Dieter 2 weeks ago who advised pt to continue Mobic and use ice packs on her knee.  At that time, pt agreed to get Xrays of both knees.  Xrays of L and R knee showed no fracture, but the R knee did show some joint space narrowing.  Patient says pain is unchanged today and would like to know what other options are available.  ROS: Denies any CP, SOB on exertion, N/V, or fever.  Does c/o arthralgias and chronic back pain.  Habits & Providers  Alcohol-Tobacco-Diet     Tobacco Status: quit     Tobacco Counseling: to remain off tobacco products     Year Quit: 1993  Allergies: No Known Drug Allergies  Past History:  Past Medical History: Last updated: 09/27/2008 DIABETES MELLITUS II, UNCOMPLICATED (ICD-250.00) HYPERTENSION, BENIGN SYSTEMIC (ICD-401.1) HYPERCHOLESTEROLEMIA (ICD-272.0) GASTROESOPHAGEAL REFLUX, NO ESOPHAGITIS (ICD-530.81) BACK STRAIN, ACUTE (ICD-847.9) RHINITIS, ALLERGIC (ICD-477.9) OBESITY, NOS (ICD-278.00) MENSTRUATION, PAINFUL (ICD-625.3) DEPRESSIVE DISORDER, NOS (ICD-311) Adjustment  Disorder h/o PUD Cardiolite:  EF 64%; no ischemia; low risk study - 11/21/2002  Past Surgical History: Last updated: 09/27/2008 Bilateral Tubal Ligation - 05/17/2001 Cholecystectomy - 05/17/2001  Family History: Last updated: 09/27/2008 Aunt-DMII, HD, amputation F-HTN, alive 11y.o M- died at 52yo,HTN, Heart dz, AMI at age 54, DMII; obesity MGM-fatal AMI at age 51yo Sister-DMII insulin dependent, alive 33y.o.  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 - was out of work due to injury 3/29 (has recovered quickly from lumbar strains in past) but resumed 08/13/08; currently applying for disability h/o tobacco 1ppd x 8 years, quit 1993; no ETOH/ellicit drugs, walks 3 miles daily (prior to injury 3/29), but has resumed walking; has a dog Sister:  Robby Sermon  Social History: lives in [place]; divorced (married 15 years; pt left her husband in 2003); 3 children (adult) who all live in [place], 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  NAD Lungs:  Normal respiratory effort, chest expands symmetrically. Lungs are clear to auscultation, no crackles or wheezes. Heart:  Normal rate and regular rhythm.  No murmurs, rubs Msk:  decreased ROM, joint tenderness, and joint swelling, crepitation of L knee.  no joint warmth, no redness. tenderness on palpation of bil  knees   Impression & Recommendations:  Problem # 1:  KNEE PAIN, LEFT (ICD-719.46) Assessment Unchanged Pt still c/o of bilateral knee pain likely secondary to joint space narrowing.  The Meloxicam and ice packs resolved the pain temporarily, but pain returned after sitting for awhile and when she wakes up in the am.  Xray of both knees showed joint space narrowing, but no fractures.  Precepted with sports med fellow who recommended we inject both joints with 3:1 lidocaine/kenalog.  Procedure was done by both Dr. Christella Hartigan and myself.  Patient was made aware of the risks of steroid injections, especially with diabetics.  Pt understood and agreed to get the injections.  She will f/u with me in 4-6 weeks.  Her updated medication list for this problem includes:    Aspirin Ec 81 Mg Tbec (Aspirin) .Marland Kitchen... Take 1 tablet by mouth once a day    Flexeril 10 Mg Tabs (Cyclobenzaprine hcl) .Marland Kitchen... 1 tab by mouth at bedtime as needed for back pain    Meloxicam 15 Mg Tabs (Meloxicam) ..... One tablet daily with food for pain    Acetaminophen 325 Mg Tabs (Acetaminophen) .Marland Kitchen... Take 1 twice daily  Orders: Injection, large joint- [facility] (20610) [facility]- Est Level  3 (04540)  Problem # 2:  ACROCHORDON (ICD-701.9) Assessment: New Patient has a 76mmx1mm skin tag located on the R side of her abdomen.  Pt would like to have this removed.  We will defer this procedure to her next visit due to the extent of her knee pain evaluation and joint injections.  Pt understood and will RTC to have skin tag removed if she can afford it.    Orders: [facility]- Est Level  3 (98119)  Complete Medication List: 1)  Aspirin Ec 81 Mg Tbec (Aspirin) .... Take 1 tablet by mouth once a day 2)  Caltrate 600+d Plus 600-400 Mg-unit Tabs (Calcium carbonate-vit d-min) .... Take 1 tablet by mouth twice a day 3)  Quinapril-hydrochlorothiazide 20-25 Mg Tabs (Quinapril-hydrochlorothiazide) .... One tab by mouth daily 4)  Glipizide 10 Mg Tabs (Glipizide) .Marland Kitchen.. 1  tablet by mouth two times a day 5)  Nitrostat 0.4 Mg Subl (Nitroglycerin) .... Place 1 under tongue if chest pain.  if pain does not resolve in 5 min call 911 and take 2nd.  take a 3rd dose if pain continues. 6)  Prodigy Blood Glucose Test Strp (Glucose blood) .... Use as directed. 7)  Flexeril 10 Mg Tabs (Cyclobenzaprine hcl) .Marland Kitchen.. 1 tab by mouth at bedtime as needed for back pain 8)  Flonase 50 Mcg/act Susp (Fluticasone propionate) .Marland Kitchen.. 1-2 sprays each nostril two times a day as needed for allergy symptoms 9)  Metformin Hcl 500 Mg Tabs (Metformin hcl) .... One tab by mouth bid 10)  Simvastatin 40 Mg Tabs (Simvastatin) .... One tab by mouth qhs 11)  Loratadine 10 Mg Tabs (Loratadine) .... One tab by mouth daily 12)  Amitriptyline Hcl 100 Mg Tabs (Amitriptyline hcl) .Marland Kitchen.. 1 tablet by mouth at bedtime  13)  Gabapentin 100 Mg Caps (Gabapentin) .... One tab by mouth four times daily 14)  Lantus 100 Unit/ml Soln (Insulin glargine) .... 48 units twice a day. dispense qs for 3 month supply. 15)  Bd Insulin Syringe Ultrafine 30g X 1/2" 1 Ml Misc (Insulin syringe-needle u-100) .... Dispence quantity sufficient for twice daily injection.  dispense one box. 16)  Metoprolol Tartrate 50 Mg Tabs (Metoprolol tartrate) .... One tab by mouth bid 17)  Meloxicam 15 Mg Tabs (Meloxicam) .... One tablet daily with food for pain 18)  Furosemide 20 Mg Tabs (Furosemide) .... One tab by mouth daily for fluid retention/swelling 19)  Potassium Chloride 20 Meq Pack (Potassium chloride) .... One tab by mouth daily with furosemide 20)  Acetaminophen 325 Mg Tabs (Acetaminophen) .... Take 1 twice daily 21)  Bd Pen Needle Short U/f 31g X 8 Mm Misc (Insulin pen needle) .... Dispense enough for once daily dosing. 22)  Victoza 18 Mg/18ml Soln (Liraglutide) .... Inject once daily. dose is 1.2 daily.  dispense 3 month supply.  Patient Instructions: 1)  Follow up w/ Dr. Tye Savoy in 4-6 weeks.

## 2010-04-21 NOTE — Progress Notes (Signed)
Summary: triage  Phone Note Call from Patient Call back at Home Phone [phone]   Caller: Patient Summary of Call: Pt having a lot of pain in her needs and needs a suggestion of what she can take for this. Initial call taken by: Clydell Hakim,  July 04, 2009 2:11 PM  Follow-up for Phone Call        pt stated that she is having bilateral knee pain X 2 weeks, it is only bothering her when she walks, denese swelling,redness, states she is taking her gabopentin 4 times a day.  Suggested that she try to take Motrin, states she is taking OTC Motrin 1 tab twice a day,  suggested that she can increase her Motrin to see if that relieves the pain, stated she would take two motrin twice a day, afraid to take to much medication.  Sched apt with PCP on Mon pm.  To PCP Follow-up by: Gladstone Pih,  July 04, 2009 2:42 PM

## 2010-04-21 NOTE — Assessment & Plan Note (Signed)
Summary: Diabetes - Rx Clinic   Vital Signs:  Patient profile:   50 year old female Height:      61 inches Weight:      294.5 pounds BMI:     55.85 Pulse rate:   85 / minute BP sitting:   142 / 90  (left arm)  Primary Care Provider:  Barnabas Lister  MD   History of Present Illness:  WD, WN, obese African American female.  She reports that she has been exercising daily by walking for 30 minutes with her daughter.  L knee pain is a limiting factor to her exercise and wishes she could exercise more.  She denies any issues with medication compliance.  She also reports that she has changed her diet and now primarily eats chicken or fish (grilled), and boils and steams vegetables, and tries to avoid red meats, juice, and soda.  She enjoys eating fruit, particularly watermelon, cantaloupe, and apples.  She reports her blood sugars have been much better, as was confirmed by her daily self blood glucose monitoring.  She denies any symptoms of hypoglycemia, and her lowest readings were in the 90s.    Current Medications (verified): 1)  Aspirin Ec 81 Mg Tbec (Aspirin) .... Take 1 Tablet By Mouth Once A Day 2)  Caltrate 600+d Plus 600-400 Mg-Unit Tabs (Calcium Carbonate-Vit D-Min) .... Take 1 Tablet By Mouth Twice A Day 3)  Quinapril-Hydrochlorothiazide 20-25 Mg Tabs (Quinapril-Hydrochlorothiazide) .... One Tab By Mouth Daily 4)  Glipizide 10 Mg  Tabs (Glipizide) .Marland Kitchen.. 1 Tablet By Mouth Two Times A Day 5)  Nitrostat 0.4 Mg Subl (Nitroglycerin) .... Place 1 Under Tongue If Chest Pain.  If Pain Does Not Resolve in 5 Min Call 911 and Take 2nd.  Take A 3rd Dose If Pain Continues. 6)  Prodigy Blood Glucose Test  Strp (Glucose Blood) .... Use As Directed. 7)  Flexeril 10 Mg  Tabs (Cyclobenzaprine Hcl) .Marland Kitchen.. 1 Tab By Mouth At Bedtime As Needed For Back Pain 8)  Flonase 50 Mcg/act  Susp (Fluticasone Propionate) .Marland Kitchen.. 1-2 Sprays Each Nostril Two Times A Day As Needed For Allergy Symptoms 9)  Metformin Hcl 500 Mg  Tabs (Metformin Hcl) .... One Tab By Mouth Bid 10)  Simvastatin 40 Mg Tabs (Simvastatin) .... One Tab By Mouth Qhs 11)  Loratadine 10 Mg Tabs (Loratadine) .... One Tab By Mouth Daily 12)  Amitriptyline Hcl 100 Mg Tabs (Amitriptyline Hcl) .Marland Kitchen.. 1 Tablet By Mouth At Bedtime 13)  Gabapentin 100 Mg Caps (Gabapentin) .... One Tab By Mouth Four Times Daily 14)  Lantus 100 Unit/ml Soln (Insulin Glargine) .... 45 Units Twice A Day. Dispense Qs For 3 Month Supply. 15)  Bd Insulin Syringe Ultrafine 30g X 1/2" 1 Ml Misc (Insulin Syringe-Needle U-100) .... Dispence Quantity Sufficient For Twice Daily Injection.  Dispense One Box. 16)  Metoprolol Tartrate 50 Mg Tabs (Metoprolol Tartrate) .... One Tab By Mouth Bid 17)  Meloxicam 15 Mg Tabs (Meloxicam) .... One Tablet Daily With Food For Pain 18)  Furosemide 20 Mg Tabs (Furosemide) .... One Tab By Mouth Daily For Fluid Retention/swelling 19)  Potassium Chloride 20 Meq Pack (Potassium Chloride) .... One Tab By Mouth Daily With Furosemide 20)  Acetaminophen 325 Mg  Tabs (Acetaminophen) .... Take 2 Three Times A Day 21)  Bd Pen Needle Short U/f 31g X 8 Mm Misc (Insulin Pen Needle) .... Dispense Enough For Once Daily Dosing. 22)  Victoza 18 Mg/70ml Soln (Liraglutide) .... Inject Once Daily.  Dispense One Month Supply.  Allergies (verified): No Known Drug Allergies   Impression & Recommendations:  Problem # 1:  DIABETES MELLITUS, TYPE II, UNCONTROLLED (ICD-250.02) Assessment Improved Diabetes of:  long-standing duration currently under: good  control of blood glucose based on A1C of: 8.7 fasting CBGs of: 90-130s before breakfast and lunch, but are higher before dinner (150s-160s).  Control is suboptimal due to: possible elevation of blood glucose between meals.   Denies hypoglycemic events.  Able to verbalize appropriate hypoglycemia management plan. Counseled on symptoms of hypoglycemia and gave her a handout with a list of possible symptoms.  Adjusted basal  insulin Lantus to 48 units twice daily, as patient was only taking 45 unit. Glipizide may not be keeping sugars down during the day. Patient will begin monitoring 1 post-prandial glucose daily in addition to her FBG readings.  Patient is seeing Dr. Sherron Flemings Cruz8/24.   F/U Rx Clinic Visit: 6 months or soone if Dr. Tye Savoy desires to start bolus insulin with meals.   TTFFC:  40 mins.  Pt seen with: Kennieth Francois, PharmD canddate    Her updated medication list for this problem includes:    Aspirin Ec 81 Mg Tbec (Aspirin) .Marland Kitchen... Take 1 tablet by mouth once a day    Quinapril-hydrochlorothiazide 20-25 Mg Tabs (Quinapril-hydrochlorothiazide) ..... One tab by mouth daily    Glipizide 10 Mg Tabs (Glipizide) .Marland Kitchen... 1 tablet by mouth two times a day    Metformin Hcl 500 Mg Tabs (Metformin hcl) ..... One tab by mouth bid    Lantus 100 Unit/ml Soln (Insulin glargine) .Marland KitchenMarland KitchenMarland KitchenMarland Kitchen 48 units twice a day. dispense qs for 3 month supply.    Victoza 18 Mg/13ml Soln (Liraglutide) ..... Inject once daily. dose is 1.2 daily.  dispense 3 month supply.  Orders: A1C-[facility] ([address]) Reassessment Each 15 min unit- [facility] ([address])  Problem # 2:  OBESITY, NOS (ICD-278.00) Assessment: Unchanged  Counseled to continue lifestyle changes including diet and exercise. Counseled on use of nitroglycerin when she experiences chest pain.  Informed patient that continued weight loss will further improve her blood sugar and A1c, and will decrease her knee pain.  Goal is loss of 10 pounds in the next 2 months.   [facility]  Complete Medication List: 1)  Aspirin Ec 81 Mg Tbec (Aspirin) .... Take 1 tablet by mouth once a day 2)  Caltrate 600+d Plus 600-400 Mg-unit Tabs (Calcium carbonate-vit d-min) .... Take 1 tablet by mouth twice a day 3)  Quinapril-hydrochlorothiazide 20-25 Mg Tabs (Quinapril-hydrochlorothiazide) .... One tab by mouth daily 4)  Glipizide 10 Mg Tabs (Glipizide) .Marland Kitchen.. 1 tablet by mouth two  times a day 5)  Nitrostat 0.4 Mg Subl (Nitroglycerin) .... Place 1 under tongue if chest pain.  if pain does not resolve in 5 min call 911 and take 2nd.  take a 3rd dose if pain continues. 6)  Prodigy Blood Glucose Test Strp (Glucose blood) .... Use as directed. 7)  Flexeril 10 Mg Tabs (Cyclobenzaprine hcl) .Marland Kitchen.. 1 tab by mouth at bedtime as needed for back pain 8)  Flonase 50 Mcg/act Susp (Fluticasone propionate) .Marland Kitchen.. 1-2 sprays each nostril two times a day as needed for allergy symptoms 9)  Metformin Hcl 500 Mg Tabs (Metformin hcl) .... One tab by mouth bid 10)  Simvastatin 40 Mg Tabs (Simvastatin) .... One tab by mouth qhs 11)  Loratadine 10 Mg Tabs (Loratadine) .... One tab by mouth daily 12)  Amitriptyline Hcl 100  Mg Tabs (Amitriptyline hcl) .Marland Kitchen.. 1 tablet by mouth at bedtime 13)  Gabapentin 100 Mg Caps (Gabapentin) .... One tab by mouth four times daily 14)  Lantus 100 Unit/ml Soln (Insulin glargine) .... 48 units twice a day. dispense qs for 3 month supply. 15)  Bd Insulin Syringe Ultrafine 30g X 1/2" 1 Ml Misc (Insulin syringe-needle u-100) .... Dispence quantity sufficient for twice daily injection.  dispense one box. 16)  Metoprolol Tartrate 50 Mg Tabs (Metoprolol tartrate) .... One tab by mouth bid 17)  Meloxicam 15 Mg Tabs (Meloxicam) .... One tablet daily with food for pain 18)  Furosemide 20 Mg Tabs (Furosemide) .... One tab by mouth daily for fluid retention/swelling 19)  Potassium Chloride 20 Meq Pack (Potassium chloride) .... One tab by mouth daily with furosemide 20)  Acetaminophen 325 Mg Tabs (Acetaminophen) .... Take 1 twice daily 21)  Bd Pen Needle Short U/f 31g X 8 Mm Misc (Insulin pen needle) .... Dispense enough for once daily dosing. 22)  Victoza 18 Mg/49ml Soln (Liraglutide) .... Inject once daily. dose is 1.2 daily.  dispense 3 month supply.  Patient Instructions: 1)  Continue to walk daily.  2)  Change lantus to 48 units twice daily. 3)  Continue great job with diet.    4)  Follow up next visit with NEW Doctor on 8/24.  Prescriptions: ACETAMINOPHEN 325 MG  TABS (ACETAMINOPHEN) Take 1 twice daily  #1 x 0   Entered by:   Christian Mate D   Authorized by:   Ivy de Lawson Radar  MD   Signed by:   Madelon Lips Pharm D on 10/31/2009   Method used:   Historical   RxID:   [id number] VICTOZA 18 MG/3ML SOLN (LIRAGLUTIDE) Inject once daily. Dose is 1.2 daily.  dispense 3 month supply.  #1 x 3   Entered by:   Christian Mate D   Authorized by:   Ivy de Lawson Radar  MD   Signed by:   Madelon Lips Pharm D on 10/31/2009   Method used:   Historical   RxID:   [id number] LANTUS 100 UNIT/ML SOLN (INSULIN GLARGINE) 48 units twice a day. Dispense QS for 3 month supply.  #1 x 3   Entered by:   Christian Mate D   Authorized by:   Ivy de Lawson Radar  MD   Signed by:   Madelon Lips Pharm D on 10/31/2009   Method used:   Historical   RxID:   [id number] BD PEN NEEDLE SHORT U/F 31G X 8 MM MISC (INSULIN PEN NEEDLE) dispense enough for once daily dosing.  #1 x 11   Entered by:   Christian Mate D   Authorized by:   Ivy de Lawson Radar  MD   Signed by:   Madelon Lips Pharm D on 10/31/2009   Method used:   [facility]        [facility] [address] [phone]* (retail)       [address]       [place], [place]  [address]       Ph: [phone] or [phone]       Fax: [phone]   RxID:   [id number] BD INSULIN SYRINGE ULTRAFINE 30G X 1/2" 1 ML MISC (INSULIN SYRINGE-NEEDLE U-100) Dispence quantity sufficient for twice daily injection.  Dispense one box.  #1 x 11   Entered by:   Christian Mate D   Authorized  by:   Barnabas Lister  MD   Signed by:   Madelon Lips Pharm D on 10/31/2009   Method used:   [facility]        [facility] [address] [phone]* (retail)       [address]       [place], [place]  [address]       Ph: [phone] or [phone]       Fax: [phone]   RxID:   [id number] GABAPENTIN  100 MG CAPS (GABAPENTIN) one tab by mouth four times daily  #120 x 6   Entered by:   Christian Mate D   Authorized by:   Ivy de Lawson Radar  MD   Signed by:   Madelon Lips Pharm D on 10/31/2009   Method used:   Electronically to        [facility] [address] [phone]* (retail)       [address]       [place], [place]  [address]       Ph: [phone] or [phone]       Fax: [phone]   RxID:   [id number] FLONASE 50 MCG/ACT  SUSP (FLUTICASONE PROPIONATE) 1-2 sprays each nostril two times a day as needed for allergy symptoms  #1 x 6   Entered by:   Christian Mate D   Authorized by:   Ivy de Lawson Radar  MD   Signed by:   Madelon Lips Pharm D on 10/31/2009   Method used:   Electronically to        [facility] [address] [phone]* (retail)       [address]       [place], [place]  [address]       Ph: [phone] or [phone]       Fax: [phone]   RxID:   [id number] NITROSTAT 0.4 MG SUBL (NITROGLYCERIN) Place 1 under tongue if chest pain.  If pain does NOT resolve in 5 min Call 911 and take 2nd.  Take a 3rd dose if pain continues.  #1 x 0   Entered by:   Christian Mate D   Authorized by:   Ivy de Lawson Radar  MD   Signed by:   Madelon Lips Pharm D on 10/31/2009   Method used:   [facility]ron[facility] to        [facility] [address] [phone]* (retail)       [address]       [place], [place]  [address]       Ph: [phone] or [phone]       Fax: [phone]   RxID:   [id number]   Prevention & Chronic Care Immunizations   Influenza vaccine: Fluvax Non-MCR  (12/27/2008)   Influenza vaccine deferral: Not indicated  (04/09/2009)   Influenza vaccine due: 01/16/2009    Tetanus booster: 04/04/2008: Tdap   Tetanus booster due: 04/04/2018    Pneumococcal vaccine: Pneumovax  (12/27/2008)   Pneumococcal vaccine due: 03/23/2023  Colorectal Screening   Hemoccult: abnormal  (04/06/2008)   Hemoccult due:  Not Indicated    Colonoscopy: Location:  [facility].    (05/09/2008)   Colonoscopy due: 05/09/2018  Other Screening   Pap smear: normal  (06/24/2006)   Pap smear due:  06/23/2009    Mammogram: ASSESSMENT: Negative - BI-RADS 1^MM DIGITAL SCREENING  (12/13/2008)   Mammogram due: 12/15/2008   Smoking status: never  (10/14/2009)  Diabetes Mellitus   HgbA1C: 11.8  (07/22/2009)   Hemoglobin A1C due: 09/26/2008    Eye exam: normal  (02/06/2008)   Eye exam due: 02/05/2009    Foot exam: yes  (12/27/2008)   High risk foot: Not documented   Foot care education: Not documented   Foot exam due: 06/25/2009    Urine microalbumin/creatinine ratio: Not documented   Urine microalbumin/cr due: 06/27/2009    Diabetes flowsheet reviewed?: Yes   Progress toward A1C goal: Improved   Diabetes comments: Improved to < 9...  Lipids   Total Cholesterol: 127  (06/27/2008)   LDL: 56  (06/27/2008)   LDL Direct: 66  (04/09/2009)   HDL: 48  (06/27/2008)   Triglycerides: 115  (06/27/2008)    SGOT (AST): 15  (08/01/2009)   SGPT (ALT): 20  (08/01/2009)   Alkaline phosphatase: 85  (08/01/2009)   Total bilirubin: 0.3  (08/01/2009)    Lipid flowsheet reviewed?: Yes   Progress toward LDL goal: At goal  Hypertension   Last Blood Pressure: 142 / 90  (10/31/2009)   Serum creatinine: 0.83  (09/09/2009)   Serum potassium 4.4  (09/09/2009)    Hypertension flowsheet reviewed?: Yes   Progress toward BP goal: Deteriorated  Self-Management Support :   Personal Goals (by the next clinic visit) :     Personal A1C goal: 8  (11/28/2008)     Personal blood pressure goal: 130/80  (11/28/2008)     Personal LDL goal: 100  (11/28/2008)     Home glucose monitoring frequency: 2 times a day  (11/28/2008)    Diabetes self-management support: CBG self-monitoring log, Written self-care plan  (10/02/2009)    Diabetes self-management support not done because: Good outcomes  (10/02/2009)     Hypertension self-management support: Written self-care plan  (10/02/2009)    Hypertension self-management support not done because: Good outcomes  (10/02/2009)    Lipid self-management support: Written self-care plan  (10/02/2009)     Lipid self-management support not done because: Good outcomes  (10/02/2009)  Appended Document: A1c  8.7 %    Lab Visit  Laboratory Results   Blood Tests   Date/Time Received: October 31, 2009 11:39 AM  Date/Time Reported: October 31, 2009 12:50 PM   HGBA1C: 8.7%   (Normal Range: Non-Diabetic - 3-6%   Control Diabetic - 6-8%)  Comments: ...............test performed by......Marland KitchenBonnie A. Swaziland, MLS (ASCP)cm    Orders Today:

## 2010-04-21 NOTE — Assessment & Plan Note (Signed)
Summary: knee pain/[place]   Vital Signs:  Patient profile:   50 year old female Weight:      288.1 pounds Temp:     98.4 degrees F oral Pulse rate:   99 / minute Pulse rhythm:   regular BP sitting:   161 / 87  (left arm)  Vitals Entered By: Loralee Pacas CMA (August 26, 2009 9:55 AM) Is Patient Diabetic? Yes Did you bring your meter with you today? No Pain Assessment Patient in pain? yes     Location: knee Intensity: 10 Type: aching Onset of pain  Constant Comments left knee pain x 1 week has tried ice, heat, elevation and pain relievers with no resolve   Primary Care Provider:  Lequita Asal  MD   History of Present Illness: Pt reports she woke up with knee pain about 1 week ago.  Worse with walking, but painful at all times.  Pain 10/10. Took ibuprofen and this eased it down to 9.  (200 mg).  Had knee pain in past, took meloxicam with some relief  (none left).  Knee pops first thing in morning when starts walking.  pain keeps her up at night.  + locking.  + swelling.   Pt has been trying to walk and lose weight to help with diabetes, but unable to walk due to pain.    Did not take meds this morning.  Habits & Providers  Alcohol-Tobacco-Diet     Tobacco Status: never  Allergies: No Known Drug Allergies  Review of Systems       see HPI  Physical Exam  General:  Obese, tearful.  No acute distress. Vitals noted Msk:  Exam difficult due to body habitus.  Pt unable to get on exam table due to pain, so exam conducted in chair.   B knees without appreciable swelling.  No erythema.  R knee without pain.  Stable medially/laterally/ant/post.  L knee with pain with ext/flexion and TTP along joint line.  No crepitus noted.  Stable med/lat/ant/post.   Psych:  Appropriate dress and grooming.  Tearful when describing pain.  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  Offered x-rays to confirm,  but pt declined today.  Will give ketorolac today and refill meloxicam as this has helped in the past.  Follow up with Dr. Lanier Prude.  [facility] Her updated medication list for this problem includes:    Aspirin Ec 81 Mg Tbec (Aspirin) .Marland Kitchen... Take 1 tablet by mouth once a day    Flexeril 10 Mg Tabs (Cyclobenzaprine hcl) .Marland Kitchen... 1 tab by mouth at bedtime as needed for back pain    Meloxicam 15 Mg Tabs (Meloxicam) ..... One tablet daily with food for pain  Orders: Ketorolac-Toradol 15mg  (E4540) Ketorolac-Toradol 15mg  (J8119) Ketorolac-Toradol 15mg  (J4782) [facility]- Est Level  3 ([address])  Problem # 2:  HYPERTENSION, BENIGN SYSTEMIC (ICD-401.1)  Bp elevated today.  Has not taken meds and is in pain.  Follow up with Dr. Lanier Prude. Her updated medication list for this problem includes:    Quinapril-hydrochlorothiazide 20-25 Mg Tabs (Quinapril-hydrochlorothiazide) ..... One tab by mouth daily    Metoprolol Tartrate 50 Mg Tabs (Metoprolol tartrate) ..... One tab by mouth bid    Furosemide 20 Mg Tabs (Furosemide) ..... One tab by mouth daily as needed for fluid retention/swelling  Orders: [facility]- Est Level  3 ([address])  Problem # 3:  DEPRESSIVE DISORDER, NOS (ICD-311)  Pt reports her pain is making her tearful.  Denies SI/HI.  Follow up with Dr. Lanier Prude. Her updated medication list for this problem includes:    Amitriptyline Hcl 100 Mg Tabs (Amitriptyline hcl) .Marland Kitchen... 1 tablet by mouth at bedtime  Orders: [facility]- Est Level  3 ([address])  Complete Medication List: 1)  Aspirin Ec 81 Mg Tbec (Aspirin) .... Take 1 tablet by mouth once a day 2)  Caltrate 600+d Plus 600-400 Mg-unit Tabs (Calcium carbonate-vit d-min) .... Take 1 tablet by mouth twice a day 3)  Quinapril-hydrochlorothiazide 20-25 Mg Tabs (Quinapril-hydrochlorothiazide) .... One tab by mouth daily 4)  Glipizide 10 Mg Tabs (Glipizide) .Marland Kitchen.. 1 tablet by mouth two times a day 5)  Nitroglycerin 0.4 Mg Subl  (Nitroglycerin) .... Place 1 tablet under tongue as directed 6)  Prodigy Blood Glucose Test Strp (Glucose blood) .... Use as directed. 7)  Flexeril 10 Mg Tabs (Cyclobenzaprine hcl) .Marland Kitchen.. 1 tab by mouth at bedtime as needed for back pain 8)  Flonase 50 Mcg/act Susp (Fluticasone propionate) .Marland Kitchen.. 1-2 sprays each nostril two times a day as needed for allergy symptoms 9)  Metformin Hcl 500 Mg Tabs (Metformin hcl) .... One tab by mouth bid 10)  Simvastatin 40 Mg Tabs (Simvastatin) .... One tab by mouth qhs 11)  Loratadine 10 Mg Tabs (Loratadine) .... One tab by mouth daily 12)  Amitriptyline Hcl 100 Mg Tabs (Amitriptyline hcl) .Marland Kitchen.. 1 tablet by mouth at bedtime 13)  Gabapentin 100 Mg Caps (Gabapentin) .... One tab by mouth tid 14)  Lantus 100 Unit/ml Soln (Insulin glargine) .... 48 units twice a day. dispense qs for 3 month supply. 15)  Bd Insulin Syringe Ultrafine 30g X 1/2" 1 Ml Misc (Insulin syringe-needle u-100) .... Dispence quantity sufficient for once daily injection.  dispense one box. 16)  Metoprolol Tartrate 50 Mg Tabs (Metoprolol tartrate) .... One tab by mouth bid 17)  Meloxicam 15 Mg Tabs (Meloxicam) .... One tablet daily with food for pain 18)  Furosemide 20 Mg Tabs (Furosemide) .... One tab by mouth daily as needed for fluid retention/swelling 19)  Potassium Chloride 20 Meq Pack (Potassium chloride) .... One tab by mouth daily with furosemide Prescriptions: MELOXICAM 15 MG TABS (MELOXICAM) one tablet daily with food for pain  #30 x 2   Entered and Authorized by:   Bradd Merlos Swaz[place] MD   Signed by:   October Peery Sw[place] MD on 08/26/2009   Method used:   [facility]        [facility] [address] [phone]* (retail)       [address]       [place], [place]  [address]       Ph: [phone] or [phone]       Fax: [phone]   RxID:   [id number]    Medication Administration  Injection # 1:    Medication: Ketorolac-Toradol 15mg     Diagnosis: KNEE PAIN, LEFT  (IHK-742.59)    Route: IM    Site: R deltoid    Exp Date: 11/21/2010    Lot #: [id number]    Mfr: hospira    Comments: pt given 60mg     Patient tolerated injection without complications    Given by: Loralee Pacas CMA (August 26, 2009 11:25 AM)  Orders Added: 1)  Ketorolac-Toradol 15mg  [J1885] 2)  Ketorolac-Toradol 15mg  [J1885] 3)  Ketorolac-Toradol 15mg  [J1885] 4)  [facility]- Est Level  3 [  [address]]  

## 2010-04-21 NOTE — Miscellaneous (Signed)
Summary: refill  Clinical Lists Changes received refill request form pharmacy for cyclobenzaprine. will forward to preceptor. Theresia Lo RN  February 20, 2010 1:37 PM  Medications: Rx of FLEXERIL 10 MG  TABS (CYCLOBENZAPRINE HCL) 1 tab by mouth at bedtime as needed for back pain;  #30 x 1;  Signed;  Entered by: Pearlean Brownie MD;  Authorized by: Pearlean Brownie MD;  Method used: [facility] [address] [phone]*, [address] [place], [place], [place]  [address], Ph: [phone] or [phone], Fax: [phone]    Prescriptions: FLEXERIL 10 MG  TABS (CYCLOBENZAPRINE HCL) 1 tab by mouth at bedtime as needed for back pain  #30 x 1   Entered and Authorized by:   Pearlean Brownie MD   Signed by:   Pearlean Brownie MD on 02/20/2010   Method used:   Electronically to        [facility] [address] [phone]* (retail)       [address]       [place], [place]  [address]       Ph: [phone] or [phone]       Fax: [phone]   RxID:   [id number]

## 2010-04-21 NOTE — Letter (Signed)
Summary: Generic Letter  [facility]  [address]   [place], [place] [address]   Phone: [phone]  Fax: [phone]    07/15/2009  [name] [address] [place], [place]  [address]  [name],  It has been my pleasure to have been your physician over the last year. I truly have enjoyed getting to know you and helping you with your healthcare needs. Over the next several weeks, you will be assigned a new provider at [facility]. He/She will have access to all of your records and continue to provide you with excellent care. If you have any questions, please feel free to contact our office.  Sincerely,   Lequita Asal  MD  Appended Document: Generic Letter mailed

## 2010-04-21 NOTE — Progress Notes (Signed)
Summary: MIssed Appt.  F/[facility] DM management.   Phone Note Outgoing Call   Call placed by: Paulino Rily, PharmD Call placed to: Patient Action Taken: Phone Call Completed Details for Reason: Missed Appointment - overslept Summary of Call: Patient reports blood glucose readings of 180 in the morning AND 210 during the afternoon.  HIghest CBG reading in the last week was 270. Patient is very happy about improved blood sugar control.   Instructed to increase from Lantus 45 units twice daily to 48 units twice daily.  Reviewed hypoglycemia plan.  Patient has rescheduled with Rx Clinic June 21st.   Patient reports losing at least 5 pounds with the recently started fluid pill.  She reports increased ability to walk.  Encouraged to walk more.     New/Updated Medications: LANTUS 100 UNIT/ML SOLN (INSULIN GLARGINE) 48 units twice a day. Dispense QS for 3 month supply. Prescriptions: LANTUS 100 UNIT/ML SOLN (INSULIN GLARGINE) 48 units twice a day. Dispense QS for 3 month supply.  #1 x 5   Entered and Authorized by:   Christian Mate D   Signed by:   Madelon Lips Pharm D on 08/19/2009   Method used:   Historical   RxID:   [id number]

## 2010-04-21 NOTE — Assessment & Plan Note (Signed)
Summary: Diabetes - Rx Clinic   Vital Signs:  Patient profile:   50 year old female Height:      61 inches Weight:      296 pounds BMI:     56.13 Pulse rate:   90 / minute BP sitting:   109 / 73  (left arm)  Primary Care Provider:  Lequita Asal  MD   History of Present Illness: Patient arrives for appointment in good spirits.  Reports blood sugar lowest during the last week was 150s and highest was 250s.   Denies hypoglycemia.  Able to explain appropriate hypogylcemia management plan.   Diagnosed with diabetes in 30s.   Started insulin a few months ago.   Eye exam done every year.  Denies tobacco.   Reports nocturia of   Current Medications (verified): 1)  Aspirin Ec 81 Mg Tbec (Aspirin) .... Take 1 Tablet By Mouth Once A Day 2)  Caltrate 600+d Plus 600-400 Mg-Unit Tabs (Calcium Carbonate-Vit D-Min) .... Take 1 Tablet By Mouth Twice A Day 3)  Quinapril-Hydrochlorothiazide 20-25 Mg Tabs (Quinapril-Hydrochlorothiazide) .... One Tab By Mouth Daily 4)  Glipizide 10 Mg  Tabs (Glipizide) .Marland Kitchen.. 1 Tablet By Mouth Two Times A Day 5)  Nitroglycerin 0.4 Mg Subl (Nitroglycerin) .... Place 1 Tablet Under Tongue As Directed 6)  Prodigy Blood Glucose Test  Strp (Glucose Blood) .... Use As Directed. 7)  Flexeril 10 Mg  Tabs (Cyclobenzaprine Hcl) .Marland Kitchen.. 1 Tab By Mouth At Bedtime As Needed For Back Pain 8)  Flonase 50 Mcg/act  Susp (Fluticasone Propionate) .Marland Kitchen.. 1-2 Sprays Each Nostril Two Times A Day As Needed For Allergy Symptoms 9)  Metformin Hcl 500 Mg Tabs (Metformin Hcl) .... One Tab By Mouth Bid 10)  Simvastatin 40 Mg Tabs (Simvastatin) .... One Tab By Mouth Qhs 11)  Loratadine 10 Mg Tabs (Loratadine) .... One Tab By Mouth Daily 12)  Amitriptyline Hcl 100 Mg Tabs (Amitriptyline Hcl) .Marland Kitchen.. 1 Tablet By Mouth At Bedtime 13)  Gabapentin 100 Mg Caps (Gabapentin) .... One Tab By Mouth Tid 14)  Lantus 100 Unit/ml Soln (Insulin Glargine) .... 48 Units Twice A Day. Dispense Qs For 3 Month  Supply. 15)  Bd Insulin Syringe Ultrafine 30g X 1/2" 1 Ml Misc (Insulin Syringe-Needle U-100) .... Dispence Quantity Sufficient For Once Daily Injection.  Dispense One Box. 16)  Metoprolol Tartrate 50 Mg Tabs (Metoprolol Tartrate) .... One Tab By Mouth Bid 17)  Meloxicam 15 Mg Tabs (Meloxicam) .... One Tablet Daily With Food For Pain 18)  Furosemide 20 Mg Tabs (Furosemide) .... One Tab By Mouth Daily For Fluid Retention/swelling 19)  Potassium Chloride 20 Meq Pack (Potassium Chloride) .... One Tab By Mouth Daily With Furosemide 20)  Acetaminophen 325 Mg  Tabs (Acetaminophen) .... Take 2 Three Times A Day  Allergies (verified): No Known Drug Allergies  Physical Exam  Extremities:  2+ left pedal edema and 2+ right pedal edema.     Impression & Recommendations:  Problem # 1:  DIABETES MELLITUS, TYPE II, UNCONTROLLED (ICD-250.02) Assessment Improved Diabetes of: 20  yrs duration currently under: imporved  control of blood glucose based on home CBGs of: 150-250   Control is suboptimal due to: limited exercise.   Denies hypoglycemic events.  Able to verbalize appropriate hypoglycemia management plan.  :Initiated victoza 0.6 for 1 week then 1.2 after.   No change in Lantus regimen at this time.  Educated about potential decreases in CBGs.  Patient instructed to call if CBGs are <80.   Written  pt instructions provided:   F/U Rx Clinic Visit: early July for additional education and med adjustment.  TTFFC: 45  mins.  Pt seen with: Terrilee Files, DO and Jeralene Peters, PharmD  Her updated medication list for this problem includes:    Aspirin Ec 81 Mg Tbec (Aspirin) .Marland Kitchen... Take 1 tablet by mouth once a day    Quinapril-hydrochlorothiazide 20-25 Mg Tabs (Quinapril-hydrochlorothiazide) ..... One tab by mouth daily    Glipizide 10 Mg Tabs (Glipizide) .Marland Kitchen... 1 tablet by mouth two times a day    Metformin Hcl 500 Mg Tabs (Metformin hcl) ..... One tab by mouth bid    Lantus 100 Unit/ml Soln (Insulin glargine)  .Marland KitchenMarland KitchenMarland KitchenMarland Kitchen 48 units twice a day. dispense qs for 3 month supply.    Victoza 18 Mg/26ml Soln (Liraglutide) ..... Inject once daily.  dispense one month supply.  Orders: Basic Met-[facility] [phone]) Inital Assessment Each - [facility] [phone])  Problem # 2:  PERIPHERAL EDEMA (ICD-782.3) Assessment: Unchanged  Edema improved per patient report however exam reveals 2+ edema in legs bilaterally and patient reports she anticipates this will worsen later in the day.   Increase furosemide to daily.   BMET today.   reevaluate at next visit.  Her updated medication list for this problem includes:    Quinapril-hydrochlorothiazide 20-25 Mg Tabs (Quinapril-hydrochlorothiazide) ..... One tab by mouth daily    Furosemide 20 Mg Tabs (Furosemide) ..... One tab by mouth daily for fluid retention/swelling  Orders: Inital Assessment Each - [facility] ([id number])  Problem # 3:  KNEE PAIN, BILATERAL (ICD-719.46) Assessment: Unchanged  Added acetaminophen 650mg  (2 X 325) three times a day to assist with walking.   Patient verbalized understanding.  Her updated medication list for this problem includes:    Aspirin Ec 81 Mg Tbec (Aspirin) .Marland Kitchen... Take 1 tablet by mouth once a day    Flexeril 10 Mg Tabs (Cyclobenzaprine hcl) .Marland Kitchen... 1 tab by mouth at bedtime as needed for back pain    Meloxicam 15 Mg Tabs (Meloxicam) ..... One tablet daily with food for pain    Acetaminophen 325 Mg Tabs (Acetaminophen) .Marland Kitchen... Take 2 three times a day  Orders: Inital Assessment Each - [facility] [phone])  Problem # 4:  OBESITY, NOS (ICD-278.00) Assessment: Deteriorated  Continues to gain weight despite use of fluid pills.  Weight today in clinic was highest ever recorded for this patient.   Patient states recent improvement in exercise and diet.  Will continue to assess.     Orders: Inital Assessment Each - [facility] [phone])  Complete Medication List: 1)  Aspirin Ec 81 Mg Tbec (Aspirin) .... Take 1 tablet by mouth once a day 2)  Caltrate  600+d Plus 600-400 Mg-unit Tabs (Calcium carbonate-vit d-min) .... Take 1 tablet by mouth twice a day 3)  Quinapril-hydrochlorothiazide 20-25 Mg Tabs (Quinapril-hydrochlorothiazide) .... One tab by mouth daily 4)  Glipizide 10 Mg Tabs (Glipizide) .Marland Kitchen.. 1 tablet by mouth two times a day 5)  Nitroglycerin 0.4 Mg Subl (Nitroglycerin) .... Place 1 tablet under tongue as directed 6)  Prodigy Blood Glucose Test Strp (Glucose blood) .... Use as directed. 7)  Flexeril 10 Mg Tabs (Cyclobenzaprine hcl) .Marland Kitchen.. 1 tab by mouth at bedtime as needed for back pain 8)  Flonase 50 Mcg/act Susp (Fluticasone propionate) .Marland Kitchen.. 1-2 sprays each nostril two times a day as needed for allergy symptoms 9)  Metformin Hcl 500 Mg Tabs (Metformin hcl) .... One tab by mouth bid 10)  Simvastatin 40 Mg Tabs (Simvastatin) .Marland KitchenMarland KitchenMarland Kitchen  One tab by mouth qhs 11)  Loratadine 10 Mg Tabs (Loratadine) .... One tab by mouth daily 12)  Amitriptyline Hcl 100 Mg Tabs (Amitriptyline hcl) .Marland Kitchen.. 1 tablet by mouth at bedtime 13)  Gabapentin 100 Mg Caps (Gabapentin) .... One tab by mouth tid 14)  Lantus 100 Unit/ml Soln (Insulin glargine) .... 48 units twice a day. dispense qs for 3 month supply. 15)  Bd Insulin Syringe Ultrafine 30g X 1/2" 1 Ml Misc (Insulin syringe-needle u-100) .... Dispence quantity sufficient for once daily injection.  dispense one box. 16)  Metoprolol Tartrate 50 Mg Tabs (Metoprolol tartrate) .... One tab by mouth bid 17)  Meloxicam 15 Mg Tabs (Meloxicam) .... One tablet daily with food for pain 18)  Furosemide 20 Mg Tabs (Furosemide) .... One tab by mouth daily for fluid retention/swelling 19)  Potassium Chloride 20 Meq Pack (Potassium chloride) .... One tab by mouth daily with furosemide 20)  Acetaminophen 325 Mg Tabs (Acetaminophen) .... Take 2 three times a day 21)  Bd Pen Needle Short U/f 31g X 8 Mm Misc (Insulin pen needle) .... Dispense enough for once daily dosing. 22)  Victoza 18 Mg/63ml Soln (Liraglutide) .... Inject once  daily.  dispense one month supply.  Patient Instructions: 1)   Begin using Victoza pen - inject 0.6 mg subcutaneously in the stomach (similar to insulin) once daily preferably before breakfast for 1 week then increase to 1.2 mg once daily.  2)  Start Tylenol 325 mg - 2 tablets three times a day for arthritis.  Please try Tylenol first for pain relief before using Meloxicam. 3)   Begin taking furosemide daily.  Only take potassium three times a week.  We will follow up with your lab results and then adjust the potassium. 4)  Follow up in rx clinic in 2 weeks (early July).  Prescriptions: VICTOZA 18 MG/3ML SOLN (LIRAGLUTIDE) Inject once daily.  dispense one month supply.  #1 x 5   Entered by:   Christian Mate D   Authorized by:   Lequita Asal  MD   Signed by:   Madelon Lips Pharm D on 09/09/2009   Method used:   Electronically to        [facility] [address] [phone]* (retail)       [address]       [place], [place]  [address]       Ph: [phone] or [phone]       Fax: [phone]   RxID:   [id number] BD PEN NEEDLE SHORT U/F 31G X 8 MM MISC (INSULIN PEN NEEDLE) dispense enough for once daily dosing.  #1 x 0   Entered by:   Christian Mate D   Authorized by:   Lequita Asal  MD   Signed by:   Madelon Lips Pharm D on 09/09/2009   Method used:   Electronically to        [facility] [address] [phone]* (retail)       [address]       [place], [place]  [address]       Ph: [phone] or [phone]       Fax: [phone]   RxID:   [id number] FUROSEMIDE 20 MG TABS (FUROSEMIDE) one tab by mouth daily for fluid retention/swelling  #1 x 0   Entered by:   Christian Mate D   Authorized by:   Cathrine Muster  Bolden  MD   Signed by:   Madelon Lips Pharm D on 09/09/2009   Method used:   Historical   RxID:   [id number] ACETAMINOPHEN 325 MG  TABS (ACETAMINOPHEN) Take 2 three times a day  #1 x 0   Entered and Authorized  by:   Christian Mate D   Signed by:   Madelon Lips Pharm D on 09/09/2009   Method used:   Historical   RxID:   [id number]   Prevention & Chronic Care Immunizations   Influenza vaccine: Fluvax Non-MCR  (12/27/2008)   Influenza vaccine deferral: Not indicated  (04/09/2009)   Influenza vaccine due: 01/16/2009    Tetanus booster: 04/04/2008: Tdap   Tetanus booster due: 04/04/2018    Pneumococcal vaccine: Pneumovax  (12/27/2008)   Pneumococcal vaccine due: 03/23/2023  Colorectal Screening   Hemoccult: abnormal  (04/06/2008)   Hemoccult due: Not Indicated    Colonoscopy: Location:  [facility].    (05/09/2008)   Colonoscopy due: 05/09/2018  Other Screening   Pap smear: normal  (06/24/2006)   Pap smear due: 06/23/2009    Mammogram: ASSESSMENT: Negative - BI-RADS 1^MM DIGITAL SCREENING  (12/13/2008)   Mammogram due: 12/15/2008   Smoking status: never  (08/26/2009)  Diabetes Mellitus   HgbA1C: 11.8  (07/22/2009)   Hemoglobin A1C due: 09/26/2008    Eye exam: normal  (02/06/2008)   Eye exam due: 02/05/2009    Foot exam: yes  (12/27/2008)   High risk foot: Not documented   Foot care education: Not documented   Foot exam due: 06/25/2009    Urine microalbumin/creatinine ratio: Not documented   Urine microalbumin/cr due: 06/27/2009    Diabetes flowsheet reviewed?: Yes   Progress toward A1C goal: Improved    Stage of readiness to change (diabetes management): Action  Lipids   Total Cholesterol: 127  (06/27/2008)   LDL: 56  (06/27/2008)   LDL Direct: 66  (04/09/2009)   HDL: 48  (06/27/2008)   Triglycerides: 115  (06/27/2008)    SGOT (AST): 15  (08/01/2009)   SGPT (ALT): 20  (08/01/2009)   Alkaline phosphatase: 85  (08/01/2009)   Total bilirubin: 0.3  (08/01/2009)    Lipid flowsheet reviewed?: Yes   Progress toward LDL goal: At goal  Hypertension   Last Blood Pressure: 109 / 73  (09/09/2009)   Serum creatinine: 0.86  (08/01/2009)   Serum  potassium 4.3  (08/01/2009)    Hypertension flowsheet reviewed?: Yes   Progress toward BP goal: At goal  Self-Management Support :   Personal Goals (by the next clinic visit) :     Personal A1C goal: 8  (11/28/2008)     Personal blood pressure goal: 130/80  (11/28/2008)     Personal LDL goal: 100  (11/28/2008)     Home glucose monitoring frequency: 2 times a day  (11/28/2008)    Diabetes self-management support: CBG self-monitoring log, Written self-care plan  (07/22/2009)    Hypertension self-management support: Written self-care plan  (06/06/2009)    Hypertension self-management support not done because: Good outcomes  (07/22/2009)    Lipid self-management support: Written self-care plan  (12/27/2008)     Lipid self-management support not done because: Good outcomes  (07/22/2009)   Appended Document: Orders Update    Clinical Lists Changes  Orders: Added new Test order of Inital Assessment Each - [facility] [id number]) - Signed      Appended Document: Diabetes - Rx Clinic Potassium  4.4 Recheck after patient is taking daily fusosemide.  Glucose was elevated at 279 - higher than anticipated based on her home readings but not inconsistent with A1C.    Follow after initiation of victoza.

## 2010-04-21 NOTE — Assessment & Plan Note (Signed)
Summary: FU DM/KH   Vital Signs:  Patient profile:   50 year old female Height:      60 inches Weight:      271.4 pounds BMI:     53.20 Temp:     98.2 degrees F oral Pulse rate:   121 / minute BP sitting:   119 / 86  (left arm) Cuff size:   large  Vitals Entered By: Gladstone Pih (April 09, 2009 1:55 PM)  Nutrition Counseling: Patient's BMI is greater than 25 and therefore counseled on weight management options. CC: F/u HTN, DM, back pain Is Patient Diabetic? Yes Did you bring your meter with you today? No   Primary Care Provider:  Lequita Asal  MD  CC:  F/u HTN, DM, and back pain.  History of Present Illness: 50 y/o female here for f/u:  1) back pain- completed PT. recommend TENS unit. would like tramadol. no incontinence or other red flags.   2) DM- denies polydipsia or any episodes of hypo/hyperglycemia. opposed to insulin. currently on metformin and glipizide. reports highest CBG 215  3) BP- denies chest pain, peripheral edema, palpitations, blurred vision, headaches. on quinapril-hctz.   Habits & Providers  Alcohol-Tobacco-Diet     Tobacco Status: never  Current Medications (verified): 1)  Aspirin Ec 81 Mg Tbec (Aspirin) .... Take 1 Tablet By Mouth Once A Day 2)  Caltrate 600+d Plus 600-400 Mg-Unit Tabs (Calcium Carbonate-Vit D-Min) .... Take 1 Tablet By Mouth Twice A Day 3)  Quinapril-Hydrochlorothiazide 20-25 Mg Tabs (Quinapril-Hydrochlorothiazide) .... One Tab By Mouth Daily 4)  Glipizide 10 Mg  Tabs (Glipizide) .Marland Kitchen.. 1 Tablet By Mouth Two Times A Day 5)  Nitroglycerin 0.4 Mg Subl (Nitroglycerin) .... Place 1 Tablet Under Tongue As Directed 6)  Onetouch Test  Strp (Glucose Blood) .Marland Kitchen.. 1 Strip Every Morning, 1 Box 7)  Flexeril 10 Mg  Tabs (Cyclobenzaprine Hcl) .Marland Kitchen.. 1 Tab By Mouth At Bedtime As Needed For Back Pain 8)  Flonase 50 Mcg/act  Susp (Fluticasone Propionate) .Marland Kitchen.. 1-2 Sprays Each Nostril Two Times A Day As Needed For Allergy Symptoms 9)  Metformin  Hcl 1000 Mg Tabs (Metformin Hcl) .Marland Kitchen.. 1 Tablet By Mouth Two Times A Day With Food 10)  Simvastatin 40 Mg Tabs (Simvastatin) .... One Tab By Mouth Qhs 11)  Loratadine 10 Mg Tabs (Loratadine) .... One Tab By Mouth Daily 12)  Amitriptyline Hcl 100 Mg Tabs (Amitriptyline Hcl) .Marland Kitchen.. 1 Tablet By Mouth At Bedtime 13)  Gabapentin 100 Mg Caps (Gabapentin) .... One Tab By Mouth Tid 14)  Vesicare 10 Mg Tabs (Solifenacin Succinate) .... One Tab By Mouth Daily  Allergies (verified): No Known Drug Allergies  Physical Exam  General:  morbidly obese, alert, NAD, cooperative to examination. vitals reviewed Mouth:  MMM Lungs:  Normal respiratory effort, chest expands symmetrically. Lungs are clear to auscultation, no crackles or wheezes. Heart:  Normal rate and regular rhythm. S1 and S2 normal without gallop, murmur, click, rub or other extra sounds. Extremities:  trace edema of bilateral lower extremities   Impression & Recommendations:  Problem # 1:  DIABETES MELLITUS, TYPE II, UNCONTROLLED (ICD-250.02) Assessment Deteriorated patient remains opposed to insulin. A1C worsening. insulin only realistic option. will continue to discuss. up to date on eye exams. f/u in 3 months.   Her updated medication list for this problem includes:    Aspirin Ec 81 Mg Tbec (Aspirin) .Marland Kitchen... Take 1 tablet by mouth once a day    Quinapril-hydrochlorothiazide 20-25 Mg Tabs (Quinapril-hydrochlorothiazide) ..... One  tab by mouth daily    Glipizide 10 Mg Tabs (Glipizide) .Marland Kitchen... 1 tablet by mouth two times a day    Metformin Hcl 1000 Mg Tabs (Metformin hcl) .Marland Kitchen... 1 tablet by mouth two times a day with food  Orders: A1C-[facility] ([address]) [facility]- Est  Level 4 (60454)  Problem # 2:  HYPERTENSION, BENIGN SYSTEMIC (ICD-401.1) Assessment: Unchanged  at goal. no changes.   Her updated medication list for this problem includes:    Quinapril-hydrochlorothiazide 20-25 Mg Tabs (Quinapril-hydrochlorothiazide) ..... One tab by mouth  daily  Orders: [facility]- Est  Level 4 (99214)  Problem # 3:  BACK PAIN, LUMBOSACRAL, CHRONIC (ICD-724.5) Assessment: Deteriorated  resume ultram. f/u TENS unit.   Her updated medication list for this problem includes:    Aspirin Ec 81 Mg Tbec (Aspirin) .Marland Kitchen... Take 1 tablet by mouth once a day    Flexeril 10 Mg Tabs (Cyclobenzaprine hcl) .Marland Kitchen... 1 tab by mouth at bedtime as needed for back pain  Orders: [facility]- Est  Level 4 (09811)  Problem # 4:  SINUS TACHYCARDIA (ICD-427.89) Assessment: Unchanged etiology unclear. check hgb and TSH  Her updated medication list for this problem includes:    Aspirin Ec 81 Mg Tbec (Aspirin) .Marland Kitchen... Take 1 tablet by mouth once a day  Orders: TSH-[facility] ([id number]) [facility]- Est  Level 4 (13086)  Complete Medication List: 1)  Aspirin Ec 81 Mg Tbec (Aspirin) .... Take 1 tablet by mouth once a day 2)  Caltrate 600+d Plus 600-400 Mg-unit Tabs (Calcium carbonate-vit d-min) .... Take 1 tablet by mouth twice a day 3)  Quinapril-hydrochlorothiazide 20-25 Mg Tabs (Quinapril-hydrochlorothiazide) .... One tab by mouth daily 4)  Glipizide 10 Mg Tabs (Glipizide) .Marland Kitchen.. 1 tablet by mouth two times a day 5)  Nitroglycerin 0.4 Mg Subl (Nitroglycerin) .... Place 1 tablet under tongue as directed 6)  Onetouch Test Strp (Glucose blood) .Marland Kitchen.. 1 strip every morning, 1 box 7)  Flexeril 10 Mg Tabs (Cyclobenzaprine hcl) .Marland Kitchen.. 1 tab by mouth at bedtime as needed for back pain 8)  Flonase 50 Mcg/act Susp (Fluticasone propionate) .Marland Kitchen.. 1-2 sprays each nostril two times a day as needed for allergy symptoms 9)  Metformin Hcl 1000 Mg Tabs (Metformin hcl) .Marland Kitchen.. 1 tablet by mouth two times a day with food 10)  Simvastatin 40 Mg Tabs (Simvastatin) .... One tab by mouth qhs 11)  Loratadine 10 Mg Tabs (Loratadine) .... One tab by mouth daily 12)  Amitriptyline Hcl 100 Mg Tabs (Amitriptyline hcl) .Marland Kitchen.. 1 tablet by mouth at bedtime 13)  Gabapentin 100 Mg Caps (Gabapentin) .... One tab by mouth tid 14)   Vesicare 10 Mg Tabs (Solifenacin succinate) .... One tab by mouth daily  Other Orders: Direct LDL-[facility] [phone]) Hemoglobin-[facility] (28413)  Patient Instructions: 1)  Keep up the good work! 2)  Follow up with Dr. Lanier Prude in April 3)  Call the Physical Therapy about the TENS unit 4)  I'll let you know if we need to add another diabetes medication   Prevention & Chronic Care Immunizations   Influenza vaccine: Fluvax Non-MCR  (12/27/2008)   Influenza vaccine deferral: Not indicated  (04/09/2009)   Influenza vaccine due: 01/16/2009    Tetanus booster: 04/04/2008: Tdap   Tetanus booster due: 04/04/2018    Pneumococcal vaccine: Pneumovax  (12/27/2008)   Pneumococcal vaccine due: 03/23/2023  Colorectal Screening   Hemoccult: abnormal  (04/06/2008)   Hemoccult due: Not Indicated    Colonoscopy: Location:  [facility].    (05/09/2008)   Colonoscopy due:  05/09/2018  Other Screening   Pap smear: normal  (06/24/2006)   Pap smear due: 06/23/2009    Mammogram: ASSESSMENT: Negative - BI-RADS 1^MM DIGITAL SCREENING  (12/13/2008)   Mammogram due: 12/15/2008   Smoking status: never  (04/09/2009)  Diabetes Mellitus   HgbA1C: >14.0  (04/09/2009)   Hemoglobin A1C due: 09/26/2008    Eye exam: normal  (02/06/2008)   Eye exam due: 02/05/2009    Foot exam: yes  (12/27/2008)   High risk foot: Not documented   Foot care education: Not documented   Foot exam due: 06/25/2009    Urine microalbumin/creatinine ratio: Not documented   Urine microalbumin/cr due: 06/27/2009    Diabetes flowsheet reviewed?: Yes   Progress toward A1C goal: Deteriorated  Lipids   Total Cholesterol: 127  (06/27/2008)   LDL: 56  (06/27/2008)   LDL Direct: 117  (06/30/2006)   HDL: 48  (06/27/2008)   Triglycerides: 115  (06/27/2008)    SGOT (AST): 14  (12/27/2008)   SGPT (ALT): 20  (12/27/2008)   Alkaline phosphatase: 85  (12/27/2008)   Total bilirubin: 0.4  (12/27/2008)    Lipid flowsheet  reviewed?: Yes   Progress toward LDL goal: At goal  Hypertension   Last Blood Pressure: 119 / 86  (04/09/2009)   Serum creatinine: 0.90  (12/27/2008)   Serum potassium 3.8  (12/27/2008)    Hypertension flowsheet reviewed?: Yes   Progress toward BP goal: At goal  Self-Management Support :   Personal Goals (by the next clinic visit) :     Personal A1C goal: 8  (11/28/2008)     Personal blood pressure goal: 130/80  (11/28/2008)     Personal LDL goal: 100  (11/28/2008)    Patient will work on the following items until the next clinic visit to reach self-care goals:     Medications and monitoring: take my medicines every day, check my blood sugar, examine my feet every day  (04/09/2009)     Eating: eat foods that are low in salt, eat fruit for snacks and desserts  (04/09/2009)     Activity: take a 30 minute walk every day  (04/09/2009)     Other: [place] Diet  (11/28/2008)     Home glucose monitoring frequency: 2 times a day  (11/28/2008)    Diabetes self-management support: Copy of home glucose meter record, CBG self-monitoring log, Written self-care plan  (04/09/2009)   Diabetes care plan printed    Hypertension self-management support: Written self-care plan  (12/27/2008)    Hypertension self-management support not done because: Good outcomes  (04/09/2009)    Lipid self-management support: Written self-care plan  (12/27/2008)     Lipid self-management support not done because: Good outcomes  (04/09/2009)  Laboratory Results   Blood Tests   Date/Time Received: April 09, 2009 2:21 PM  Date/Time Reported: April 09, 2009 2:35 PM   HGBA1C: >14.0%   (Normal Range: Non-Diabetic - 3-6%   Control Diabetic - 6-8%)   CBC   HGB:  13.4 g/dL   (Normal Range: 11.9-14.7 in Males, 12.0-15.0 in Females) Comments: venous sample ...............test performed by......Marland KitchenBonnie A. [place], MLS (ASCP)cm

## 2010-04-21 NOTE — Miscellaneous (Signed)
Summary: prob list  Clinical Lists Changes  Problems: Removed problem of HEMOCCULT POSITIVE STOOL - NEG EGD/COLONOSCOPY (ICD-578.1) - Signed

## 2010-04-21 NOTE — Assessment & Plan Note (Signed)
Summary: knee pain,tcb   Vital Signs:  Patient profile:   49 year old female Height:      61 inches Weight:      296 pounds BMI:     56.13 BSA:     2.23 Temp:     98.4 degrees F Pulse rate:   83 / minute BP sitting:   117 / 77  Vitals Entered By: Jone Baseman CMA (October 14, 2009 4:25 PM) CC: Left knee pain x 2 weeks Is Patient Diabetic? No Pain Assessment Patient in pain? yes     Location: left knee Intensity: 10   Primary Care Provider:  Barnabas Lister  MD  CC:  Left knee pain x 2 weeks.  History of Present Illness: Left Knee Pain> Rt knee Pain: PT seen for knee pain about 1 month ago. She was given the option of getting a knee xray at that time but decided to medically manage the knee at that time. She has been taking Mobic 15 mg a day and says it does not really help. She has not been able to exercise in 1 week and says she wants to exercise so she can get her diabetes under better control. She has been taking Tylenol 500mg  two times a day for the knee pain but that is not helping either. She says the pain is worse at night and she has to hang her leg off the bed because it hurts to ly flat, she also hurts first thing in the morning when she starts to walk. The pain has been bad for 1 month. She does not remember an injury.   Habits & Providers  Alcohol-Tobacco-Diet     Tobacco Status: never  Current Medications (verified): 1)  Aspirin Ec 81 Mg Tbec (Aspirin) .... Take 1 Tablet By Mouth Once A Day 2)  Caltrate 600+d Plus 600-400 Mg-Unit Tabs (Calcium Carbonate-Vit D-Min) .... Take 1 Tablet By Mouth Twice A Day 3)  Quinapril-Hydrochlorothiazide 20-25 Mg Tabs (Quinapril-Hydrochlorothiazide) .... One Tab By Mouth Daily 4)  Glipizide 10 Mg  Tabs (Glipizide) .Marland Kitchen.. 1 Tablet By Mouth Two Times A Day 5)  Nitroglycerin 0.4 Mg Subl (Nitroglycerin) .... Place 1 Tablet Under Tongue As Directed 6)  Prodigy Blood Glucose Test  Strp (Glucose Blood) .... Use As Directed. 7)  Flexeril  10 Mg  Tabs (Cyclobenzaprine Hcl) .Marland Kitchen.. 1 Tab By Mouth At Bedtime As Needed For Back Pain 8)  Flonase 50 Mcg/act  Susp (Fluticasone Propionate) .Marland Kitchen.. 1-2 Sprays Each Nostril Two Times A Day As Needed For Allergy Symptoms 9)  Metformin Hcl 500 Mg Tabs (Metformin Hcl) .... One Tab By Mouth Bid 10)  Simvastatin 40 Mg Tabs (Simvastatin) .... One Tab By Mouth Qhs 11)  Loratadine 10 Mg Tabs (Loratadine) .... One Tab By Mouth Daily 12)  Amitriptyline Hcl 100 Mg Tabs (Amitriptyline Hcl) .Marland Kitchen.. 1 Tablet By Mouth At Bedtime 13)  Gabapentin 100 Mg Caps (Gabapentin) .... One Tab By Mouth Tid 14)  Lantus 100 Unit/ml Soln (Insulin Glargine) .... 48 Units Twice A Day. Dispense Qs For 3 Month Supply. 15)  Bd Insulin Syringe Ultrafine 30g X 1/2" 1 Ml Misc (Insulin Syringe-Needle U-100) .... Dispence Quantity Sufficient For Once Daily Injection.  Dispense One Box. 16)  Metoprolol Tartrate 50 Mg Tabs (Metoprolol Tartrate) .... One Tab By Mouth Bid 17)  Meloxicam 15 Mg Tabs (Meloxicam) .... One Tablet Daily With Food For Pain 18)  Furosemide 20 Mg Tabs (Furosemide) .... One Tab By Mouth Daily For  Fluid Retention/swelling 19)  Potassium Chloride 20 Meq Pack (Potassium Chloride) .... One Tab By Mouth Daily With Furosemide 20)  Acetaminophen 325 Mg  Tabs (Acetaminophen) .... Take 2 Three Times A Day 21)  Bd Pen Needle Short U/f 31g X 8 Mm Misc (Insulin Pen Needle) .... Dispense Enough For Once Daily Dosing. 22)  Victoza 18 Mg/68ml Soln (Liraglutide) .... Inject Once Daily.  Dispense One Month Supply.  Allergies (verified): No Known Drug Allergies  Social History: Smoking Status:  never  Review of Systems        vitals reviewed and pertinent negatives and positives seen in HPI   Physical Exam  General:  obese, in no acute distress; alert,appropriate and cooperative throughout examination, vital signs reviewed Msk:  valgus and varus stress causes pain medially on bilateral legs, tenderness along bilateral joint  lines in knees, no laxity in joints in PCL or ACL. No swelling noted, minimal grinding bilaterally with full extension and flexion of the knees.    Impression & Recommendations:  Problem # 1:  KNEE PAIN, BILATERAL (ICD-719.46) Assessment Deteriorated Pt will cont Mobic and start icing the knees 3x/day for 15 min. Plan to get xray done. Expect to see some joint space narrowing as she likely has some joint disease.  Could consider joint injection, sports medicine referral or referral to specialist. Weight loss will help a lot.   Her updated medication list for this problem includes:    Aspirin Ec 81 Mg Tbec (Aspirin) .Marland Kitchen... Take 1 tablet by mouth once a day    Flexeril 10 Mg Tabs (Cyclobenzaprine hcl) .Marland Kitchen... 1 tab by mouth at bedtime as needed for back pain    Meloxicam 15 Mg Tabs (Meloxicam) ..... One tablet daily with food for pain    Acetaminophen 325 Mg Tabs (Acetaminophen) .Marland Kitchen... Take 2 three times a day  Orders: Radiology other (Radiology Other) [facility]- Est Level  3 ([address])  Complete Medication List: 1)  Aspirin Ec 81 Mg Tbec (Aspirin) .... Take 1 tablet by mouth once a day 2)  Caltrate 600+d Plus 600-400 Mg-unit Tabs (Calcium carbonate-vit d-min) .... Take 1 tablet by mouth twice a day 3)  Quinapril-hydrochlorothiazide 20-25 Mg Tabs (Quinapril-hydrochlorothiazide) .... One tab by mouth daily 4)  Glipizide 10 Mg Tabs (Glipizide) .Marland Kitchen.. 1 tablet by mouth two times a day 5)  Nitroglycerin 0.4 Mg Subl (Nitroglycerin) .... Place 1 tablet under tongue as directed 6)  Prodigy Blood Glucose Test Strp (Glucose blood) .... Use as directed. 7)  Flexeril 10 Mg Tabs (Cyclobenzaprine hcl) .Marland Kitchen.. 1 tab by mouth at bedtime as needed for back pain 8)  Flonase 50 Mcg/act Susp (Fluticasone propionate) .Marland Kitchen.. 1-2 sprays each nostril two times a day as needed for allergy symptoms 9)  Metformin Hcl 500 Mg Tabs (Metformin hcl) .... One tab by mouth bid 10)  Simvastatin 40 Mg Tabs (Simvastatin) .... One tab by mouth  qhs 11)  Loratadine 10 Mg Tabs (Loratadine) .... One tab by mouth daily 12)  Amitriptyline Hcl 100 Mg Tabs (Amitriptyline hcl) .Marland Kitchen.. 1 tablet by mouth at bedtime 13)  Gabapentin 100 Mg Caps (Gabapentin) .... One tab by mouth tid 14)  Lantus 100 Unit/ml Soln (Insulin glargine) .... 48 units twice a day. dispense qs for 3 month supply. 15)  Bd Insulin Syringe Ultrafine 30g X 1/2" 1 Ml Misc (Insulin syringe-needle u-100) .... Dispence quantity sufficient for once daily injection.  dispense one box. 16)  Metoprolol Tartrate 50 Mg Tabs (Metoprolol tartrate) .... One tab by mouth  bid 17)  Meloxicam 15 Mg Tabs (Meloxicam) .... One tablet daily with food for pain 18)  Furosemide 20 Mg Tabs (Furosemide) .... One tab by mouth daily for fluid retention/swelling 19)  Potassium Chloride 20 Meq Pack (Potassium chloride) .... One tab by mouth daily with furosemide 20)  Acetaminophen 325 Mg Tabs (Acetaminophen) .... Take 2 three times a day 21)  Bd Pen Needle Short U/f 31g X 8 Mm Misc (Insulin pen needle) .... Dispense enough for once daily dosing. 22)  Victoza 18 Mg/61ml Soln (Liraglutide) .... Inject once daily.  dispense one month supply.  Patient Instructions: 1)  You are on the highest dose of the Meloxicam. Continue taking it once a day.  2)  Ice your knee 3 times a day for 15 min at a time. Use a bag of peas to help keep inflammation down.  3)  I have ordered an x-ray for you. You can go to the hospital or [facility] to get it done.

## 2010-04-21 NOTE — Progress Notes (Signed)
Summary: refill  Phone Note Refill Request Call back at Home Phone [phone] Message from:  Patient  Refills Requested: Medication #1:  Prodogy test strips Initial call taken by: De Nurse,  April 22, 2009 9:33 AM  Follow-up for Phone Call        to pcp Follow-up by: Golden Circle RN,  April 22, 2009 9:35 AM    New/Updated Medications: PRODIGY BLOOD GLUCOSE TEST  STRP (GLUCOSE BLOOD) use as directed. Prescriptions: PRODIGY BLOOD GLUCOSE TEST  STRP (GLUCOSE BLOOD) use as directed.  #100 x 4   Entered and Authorized by:   Lequita Asal  MD   Signed by:   Lequita Asal  MD on 04/22/2009   Method used:   Electronically to        [facility] [address] [phone]* (retail)       [address]       [place], [place]  [address]       Ph: [phone] or [phone]       Fax: [phone]   RxID:   [id number]

## 2010-04-21 NOTE — Assessment & Plan Note (Signed)
Summary: PATIENT SUMMARY   Vital Signs:  Patient profile:   50 year old female Weight:      289.1 pounds BMI:     54.82 Temp:     98.4 degrees F oral Pulse rate:   82 / minute BP sitting:   118 / 81  (left arm) Cuff size:   large  Vitals Entered By: Loralee Pacas CMA (Jul 22, 2009 9:36 AM)  Nutrition Counseling: Patient's BMI is greater than 25 and therefore counseled on weight management options. CC: follow-up visit Is Patient Diabetic? Yes Did you bring your meter with you today? No   Primary Care Provider:  Lequita Asal  MD  CC:  follow-up visit.  History of Present Illness: 50 y/o female here for f/u   DM- states CBGs improved per patient. fastings 180s-190s. denies polyuria, polydipsia. up to 75 units of lantus daily.   tachycardia- improvement with metoprolol at 50 mg twice a day.  HTN- denies chest pain, peripheral edema, headaches, blurred vision. on metoprolol, quinapril, hctz.     Current Medications (verified): 1)  Aspirin Ec 81 Mg Tbec (Aspirin) .... Take 1 Tablet By Mouth Once A Day 2)  Caltrate 600+d Plus 600-400 Mg-Unit Tabs (Calcium Carbonate-Vit D-Min) .... Take 1 Tablet By Mouth Twice A Day 3)  Quinapril-Hydrochlorothiazide 20-25 Mg Tabs (Quinapril-Hydrochlorothiazide) .... One Tab By Mouth Daily 4)  Glipizide 10 Mg  Tabs (Glipizide) .Marland Kitchen.. 1 Tablet By Mouth Two Times A Day 5)  Nitroglycerin 0.4 Mg Subl (Nitroglycerin) .... Place 1 Tablet Under Tongue As Directed 6)  Prodigy Blood Glucose Test  Strp (Glucose Blood) .... Use As Directed. 7)  Flexeril 10 Mg  Tabs (Cyclobenzaprine Hcl) .Marland Kitchen.. 1 Tab By Mouth At Bedtime As Needed For Back Pain 8)  Flonase 50 Mcg/act  Susp (Fluticasone Propionate) .Marland Kitchen.. 1-2 Sprays Each Nostril Two Times A Day As Needed For Allergy Symptoms 9)  Metformin Hcl 500 Mg Tabs (Metformin Hcl) .... One Tab By Mouth Bid 10)  Simvastatin 40 Mg Tabs (Simvastatin) .... One Tab By Mouth Qhs 11)  Loratadine 10 Mg Tabs (Loratadine) .... One  Tab By Mouth Daily 12)  Amitriptyline Hcl 100 Mg Tabs (Amitriptyline Hcl) .Marland Kitchen.. 1 Tablet By Mouth At Bedtime 13)  Gabapentin 100 Mg Caps (Gabapentin) .... One Tab By Mouth Tid 14)  Lantus 100 Unit/ml Soln (Insulin Glargine) .... Inject 15 Units Daily and Then Adjust As Directed.  Increase By 1 Unit Daily If Am Fasting Blood Glucose >100.    Dispense One Month Supply. 15)  Bd Insulin Syringe Ultrafine 30g X 1/2" 1 Ml Misc (Insulin Syringe-Needle U-100) .... Dispence Quantity Sufficient For Once Daily Injection.  Dispense One Box. 16)  Metoprolol Tartrate 50 Mg Tabs (Metoprolol Tartrate) .... One Tab By Mouth Bid 17)  Meloxicam 15 Mg Tabs (Meloxicam) .... One Tablet Daily With Food For Pain  Allergies (verified): No Known Drug Allergies  Past History:  Past medical history reviewed for relevance to current acute and chronic problems.  Past Medical History: Reviewed history from 09/27/2008 and no changes required. DIABETES MELLITUS II, UNCOMPLICATED (ICD-250.00) HYPERTENSION, BENIGN SYSTEMIC (ICD-401.1) HYPERCHOLESTEROLEMIA (ICD-272.0) GASTROESOPHAGEAL REFLUX, NO ESOPHAGITIS (ICD-530.81) BACK STRAIN, ACUTE (ICD-847.9) RHINITIS, ALLERGIC (ICD-477.9) OBESITY, NOS (ICD-278.00) MENSTRUATION, PAINFUL (ICD-625.3) DEPRESSIVE DISORDER, NOS (ICD-311) Adjustment Disorder h/o PUD Cardiolite:  EF 64%; no ischemia; low risk study - 11/21/2002  Physical Exam  General:  morbidly obese, alert, NAD, cooperative to examination. vitals reviewed Mouth:  MMM Lungs:  Normal respiratory effort, chest expands symmetrically. Lungs  are clear to auscultation, no crackles or wheezes. Heart:  Normal rate and regular rhythm. S1 and S2 normal without gallop, murmur, click, rub or other extra sounds. Abdomen:  obese, NT, ND, +BS Extremities:  trace LE edema to midcalf bilaterally   Impression & Recommendations:  Problem # 1:  DIABETES MELLITUS, TYPE II, UNCONTROLLED (ICD-250.02) Assessment Improved A1C 11.8.  discussed with Dr. Raymondo Band. will split lantus to two times a day dosing. increase to 45 units two times a day with titration instructions (see patient instructions). follow up in pharmacy clinic in 4 weeks. consider d/c metformin. patient working on increasing exercise.   Her updated medication list for this problem includes:    Aspirin Ec 81 Mg Tbec (Aspirin) .Marland Kitchen... Take 1 tablet by mouth once a day    Quinapril-hydrochlorothiazide 20-25 Mg Tabs (Quinapril-hydrochlorothiazide) ..... One tab by mouth daily    Glipizide 10 Mg Tabs (Glipizide) .Marland Kitchen... 1 tablet by mouth two times a day    Metformin Hcl 500 Mg Tabs (Metformin hcl) ..... One tab by mouth bid    Lantus 100 Unit/ml Soln (Insulin glargine) .Marland KitchenMarland KitchenMarland KitchenMarland Kitchen 45 units twice a day. increase as directed. disp 3 vials.  Orders: A1C-[facility] (52841) [facility]- Est  Level 4 (32440)  Problem # 2:  SINUS TACHYCARDIA (ICD-427.89) Assessment: Improved  no etiology found. continue metoprolol.   Her updated medication list for this problem includes:    Aspirin Ec 81 Mg Tbec (Aspirin) .Marland Kitchen... Take 1 tablet by mouth once a day    Metoprolol Tartrate 50 Mg Tabs (Metoprolol tartrate) ..... One tab by mouth bid  Orders: [facility]- Est  Level 4 ([address])  Problem # 3:  HYPERTENSION, BENIGN SYSTEMIC (ICD-401.1) Assessment: Improved  no changes.   Her updated medication list for this problem includes:    Quinapril-hydrochlorothiazide 20-25 Mg Tabs (Quinapril-hydrochlorothiazide) ..... One tab by mouth daily    Metoprolol Tartrate 50 Mg Tabs (Metoprolol tartrate) ..... 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 improved with meloxicam. consider injection in future if persists.   Her updated medication list for this problem includes:    Aspirin Ec 81 Mg Tbec (Aspirin) .Marland Kitchen... Take 1 tablet by mouth once a day    Flexeril 10 Mg Tabs (Cyclobenzaprine hcl) .Marland Kitchen... 1 tab by mouth at  bedtime as needed for back pain    Meloxicam 15 Mg Tabs (Meloxicam) ..... One tablet daily with food for pain  Problem # 5:  BACK PAIN, LUMBOSACRAL, CHRONIC (ICD-724.5) Assessment: Comment Only was going to PT. has TENS unit.   Her updated medication list for this problem includes:    Aspirin Ec 81 Mg Tbec (Aspirin) .Marland Kitchen... Take 1 tablet by mouth once a day    Flexeril 10 Mg Tabs (Cyclobenzaprine hcl) .Marland Kitchen... 1 tab by mouth at bedtime as needed for back pain    Meloxicam 15 Mg Tabs (Meloxicam) ..... One tablet daily with food for pain  Problem # 6:  HYPERCHOLESTEROLEMIA (ICD-272.0) Assessment: Comment Only LDL at goal. continue simvastatin  Her updated medication list for this problem includes:    Simvastatin 40 Mg Tabs (Simvastatin) ..... One tab by mouth qhs  Patient Instructions: 1)  You are making GREAT progress!!! Keep up the good work! 2)  We are going to make some adjustments to your insulin: 3)  We are going to SPLIT your insulin into TWICE A DAY- 45 units in the morning, 45  units at bedtime.  4)  Keep checking your sugars at least twice a day.  5)  If after 3 days, your fasting (morning) sugars are over 150, increase to 46 units TWICE A DAY.  6)  In 3 more days, if your fasting sugars are over 150, increase to 47 units TWICE A DAY.  7)  Keep going up by one unit every 3 days, until your fasting sugars are less than 150.  8)  Follow up in PHARMACY clinic in 4 weeks.   Laboratory Results   Blood Tests   Date/Time Received: Jul 22, 2009 9:48 AM  Date/Time Reported: Jul 22, 2009 9:48 AM    HGBA1C: 11.8%   (Normal Range: Non-Diabetic - 3-6%   Control Diabetic - 6-8%)  Comments: ...........test performed by..........Marland Kitchen [place], SMA      Prevention & Chronic Care Immunizations   Influenza vaccine: Fluvax Non-MCR  (12/27/2008)   Influenza vaccine deferral: Not indicated  (04/09/2009)   Influenza vaccine due: 01/16/2009    Tetanus booster: 04/04/2008: Tdap    Tetanus booster due: 04/04/2018    Pneumococcal vaccine: Pneumovax  (12/27/2008)   Pneumococcal vaccine due: 03/23/2023  Colorectal Screening   Hemoccult: abnormal  (04/06/2008)   Hemoccult due: Not Indicated    Colonoscopy: Location:  [facility].    (05/09/2008)   Colonoscopy due: 05/09/2018  Other Screening   Pap smear: normal  (06/24/2006)   Pap smear due: 06/23/2009    Mammogram: ASSESSMENT: Negative - BI-RADS 1^MM DIGITAL SCREENING  (12/13/2008)   Mammogram due: 12/15/2008   Smoking status: never  (07/07/2009)  Diabetes Mellitus   HgbA1C: 11.8  (07/22/2009)   Hemoglobin A1C due: 09/26/2008    Eye exam: normal  (02/06/2008)   Eye exam due: 02/05/2009    Foot exam: yes  (12/27/2008)   High risk foot: Not documented   Foot care education: Not documented   Foot exam due: 06/25/2009    Urine microalbumin/creatinine ratio: Not documented   Urine microalbumin/cr due: 06/27/2009    Diabetes flowsheet reviewed?: Yes   Progress toward A1C goal: Improved  Lipids   Total Cholesterol: 127  (06/27/2008)   LDL: 56  (06/27/2008)   LDL Direct: 66  (04/09/2009)   HDL: 48  (06/27/2008)   Triglycerides: 115  (06/27/2008)    SGOT (AST): 14  (12/27/2008)   SGPT (ALT): 20  (12/27/2008)   Alkaline phosphatase: 85  (12/27/2008)   Total bilirubin: 0.4  (12/27/2008)    Lipid flowsheet reviewed?: Yes   Progress toward LDL goal: At goal  Hypertension   Last Blood Pressure: 118 / 81  (07/22/2009)   Serum creatinine: 0.90  (12/27/2008)   Serum potassium 3.8  (12/27/2008)    Hypertension flowsheet reviewed?: Yes   Progress toward BP goal: At goal  Self-Management Support :   Personal Goals (by the next clinic visit) :     Personal A1C goal: 8  (11/28/2008)     Personal blood pressure goal: 130/80  (11/28/2008)     Personal LDL goal: 100  (11/28/2008)    Patient will work on the following items until the next clinic visit to reach self-care goals:      Medications and monitoring: take my medicines every day, check my blood pressure, examine my feet every day  (07/22/2009)     Eating: eat fruit for snacks and desserts  (07/22/2009)     Activity: take a 30 minute walk every day  (07/22/2009)     Other:  [place] Diet  (11/28/2008)     Home glucose monitoring frequency: 2 times a day  (11/28/2008)    Diabetes self-management support: CBG self-monitoring log, Written self-care plan  (07/22/2009)   Diabetes care plan printed    Hypertension self-management support: Written self-care plan  (06/06/2009)    Hypertension self-management support not done because: Good outcomes  (07/22/2009)    Lipid self-management support: Written self-care plan  (12/27/2008)     Lipid self-management support not done because: Good outcomes  (07/22/2009)

## 2010-04-23 NOTE — Miscellaneous (Signed)
Summary: changing clinics  Clinical Lists Changes  changing doctors to another Primary care - no longer coming here. De Nurse  March 02, 2010 11:14 AM  Appended Document: changing clinics going to [facility] - rec'd Med Rec release

## 2010-04-23 NOTE — Miscellaneous (Signed)
Summary: PA required  Clinical Lists Changes  PA   form for meloxicam placed in MD box for completion. Theresia Lo RN  March 09, 2010 12:03 PM

## 2010-04-23 NOTE — Letter (Signed)
Summary: Prior Authorization required for Meloxicam  Filled out and placed in pile to fax.  Meloxicam 7.5mg  requires prior authoriztion for Medicaid.  Form placed in Dr. Domenick Bookbinder box for completion.     [name]  February 20, 2010 2:19 PM  Appended Document: Prior Authorization required for Meloxicam PA form faxed to [facility].

## 2010-04-23 NOTE — Progress Notes (Signed)
  Phone Note Other Incoming Call back at [phone]   Caller: [employer] Summary of Call: Gave one time NPI# authorization for visit.  Pt has transferred from practice.  Informed receptionist that pt will need to have [facility]'s name removed from [facility] card.  Will not authorize anymore visits. Initial call taken by: Abundio Miu,  March 05, 2010 9:36 AM

## 2010-04-23 NOTE — Miscellaneous (Signed)
Summary: regarding PA  Thanks, she will need to request it from her new PCP since my request was denied.  Clinical Lists Changes received a denial from  Medicaid for meloxicam.  will notify Dr. Tye Savoy.  It is noted that patient has transferred her care to another provider. Theresia Lo RN  March 12, 2010 9:11 AM

## 2010-05-30 ENCOUNTER — Other Ambulatory Visit: Payer: Self-pay | Admitting: Family Medicine

## 2010-06-06 ENCOUNTER — Other Ambulatory Visit: Payer: Self-pay | Admitting: Family Medicine

## 2010-06-08 NOTE — Telephone Encounter (Deleted)
Refill request

## 2010-06-08 NOTE — Telephone Encounter (Signed)
Refill request

## 2010-06-09 NOTE — Telephone Encounter (Signed)
Refill request for patient of Dr. Loren Racer.

## 2010-06-09 NOTE — Telephone Encounter (Deleted)
Refill request for a patient of Dr. Tye Savoy. It was sent  yesterday to her when she was in clinic but looks like it was not done.

## 2010-06-09 NOTE — Telephone Encounter (Signed)
It is noted this patient changed providers 03/04/2010. Will send message to pharmacy.

## 2010-06-09 NOTE — Telephone Encounter (Signed)
Hi Larita Fife, is the patient who switched providers?  Why is she still requesting meds from me?  Lajoyce Corners

## 2010-06-09 NOTE — Telephone Encounter (Signed)
Patient has changed doctors. No longer a patient here.

## 2010-06-09 NOTE — Telephone Encounter (Signed)
Dr.Hensel actually refilled rx but RN called pharmacy and advised them to cancel refill. Since patient has  changed doctors.

## 2010-06-30 LAB — CARDIAC PANEL(CRET KIN+CKTOT+MB+TROPI)
CK, MB: 0.9 ng/mL (ref 0.3–4.0)
CK, MB: 1 ng/mL (ref 0.3–4.0)
Relative Index: INVALID (ref 0.0–2.5)
Total CK: 104 U/L (ref 7–177)
Total CK: 88 U/L (ref 7–177)
Troponin I: 0.01 ng/mL (ref 0.00–0.06)

## 2010-06-30 LAB — CBC
Hemoglobin: 11.8 g/dL — ABNORMAL LOW (ref 12.0–15.0)
Platelets: 333 10*3/uL (ref 150–400)
RDW: 14.9 % (ref 11.5–15.5)

## 2010-06-30 LAB — COMPREHENSIVE METABOLIC PANEL
AST: 20 U/L (ref 0–37)
BUN: 11 mg/dL (ref 6–23)
CO2: 27 mEq/L (ref 19–32)
Chloride: 104 mEq/L (ref 96–112)
Creatinine, Ser: 1.01 mg/dL (ref 0.4–1.2)
GFR calc non Af Amer: 58 mL/min — ABNORMAL LOW (ref >60)
Total Bilirubin: 0.4 mg/dL (ref 0.3–1.2)

## 2010-06-30 LAB — BASIC METABOLIC PANEL
BUN: 14 mg/dL (ref 6–23)
CO2: 30 mEq/L (ref 19–32)
Calcium: 9.2 mg/dL (ref 8.4–10.5)
Creatinine, Ser: 0.94 mg/dL (ref 0.4–1.2)
GFR calc Af Amer: >60 mL/min (ref >60)

## 2010-06-30 LAB — GLUCOSE, CAPILLARY

## 2010-07-07 LAB — GLUCOSE, CAPILLARY
Glucose-Capillary: 108 mg/dL — ABNORMAL HIGH (ref 70–99)
Glucose-Capillary: 133 mg/dL — ABNORMAL HIGH (ref 70–99)
Glucose-Capillary: 197 mg/dL — ABNORMAL HIGH (ref 70–99)
Glucose-Capillary: 228 mg/dL — ABNORMAL HIGH (ref 70–99)

## 2010-07-30 ENCOUNTER — Other Ambulatory Visit: Payer: Self-pay | Admitting: Family Medicine

## 2010-08-04 NOTE — Discharge Summary (Signed)
NAMESHEELAH, RITACCO               ACCOUNT NO.:  [id number]   MEDICAL RECORD NO.:  [id number]          PATIENT TYPE:  OBV   LOCATION:  6705                         FACILITY:  [facility]   PHYSICIAN:  Leighton Roach McDiarmid, M.D.DATE OF BIRTH:  17-Nov-1960   DATE OF ADMISSION:  08/14/2008  DATE OF DISCHARGE:  08/15/2008                               DISCHARGE SUMMARY   PRIMARY CARE PHYSICIAN:  Drue Dun, MD, at the [facility].   DISCHARGE DIAGNOSES:  1. Noncardiac-related shortness of breath.  2. Diabetes.  3. Hypertension.  4. Hyperlipidemia.  5. Gastroesophageal reflux disease.  6. Depression.   DISCHARGE MEDICATIONS:  1. Aspirin 81 mg p.o. daily.  2. Caltrate 600 plus vitamin D 600 plus magnesium 400 p.o. daily.  3. Lisinopril/HCTZ 20/25 p.o. once daily.  4. Glipizide 10 mg p.o. twice daily.  5. Metformin 1000 mg p.o. twice daily.  6. Prilosec 20 mg p.o. once daily.  7. Flexeril 10 mg p.o. nightly.  8. Flonase 50 mcg 1-2 sprays in each nostril twice daily.  9. Zyrtec 10 mg p.o. nightly for allergy.  10.Simvastatin 20 mg p.o. nightly.  11.Neurontin 300 mg p.o. nightly, 100 mg p.o. q.a.m. and q. noon.  12.Amitriptyline 100 mg p.o. nightly.  13.Allegra 150 mg p.o. once daily in place of Zyrtec.  14.Ibuprofen 600 mg p.o. q.6 h. as needed for pain.   CONSULTATIONS:  None.   PROCEDURES:  1. Chest x-ray on Aug 14, 2008, that showed low lung volumes, no acute      findings.  2. A 2-D echo on Aug 14, 2008, that showed left ventricle cavity size      was normal.  Wall thickness was normal.  Systolic function was      normal.  Estimated traction fraction in the range of 55-60%.  Left      ventricular diastolic function parameters were normal.   PERTINENT LABORATORY DATA:  1. Serial cardiac enzymes were negative.  2. CBC that was within normal limit with the exception of hemoglobin      being 11.8.  3. CMET that was within normal limits with the exception of albumin  that was decreased at 3.3, creatinine normal at 0.94 to 1.01.   BRIEF HOSPITAL COURSE:  This is a 50 year old female who was admitted  for shortness of breath that was acute on onset.  1. Shortness of breath.  Differential for shortness of breath include      pneumonia, pulmonary edema, and mild dyspnea or anxiety.  Pneumonia      was rule out by chest x-ray, the patient did not have any cough and      has remained afebrile.  MI was rule out by cardiac enzymes that      were within normal limit.  The patient had EKG that did not show      any ST wave changes.  The shortness of breath may be secondary to      anxiety as the patient was getting ready for work and was moving      around was concerned that she was late.  She did not have any chest      pain during this hospitalization.  The patient had a 2-D echo that      showed normal systolic function.  The patient does have significant      cardiac risk factors that include diabetes, hypertension, and      family history of an MI at young age in a female and obesity.  A      stress test on an outpatient basis should be considered.  The      patient does not have insurance and may most likely not be able to      perform on an exercise stress test.  The patient would benefit from      filling out paper work to be part of [facility] so that she can      be seen at [facility] for a pharmacologic stress test.  2. Diabetes.  Metformin was held during this hospitalization secondary      to concerns that the patient may need a cardiac cath.  She should      be discharged home with her home regimen of glipizide and      metformin.  3. Hypertension.  The patient is to continue on her home course of      lisinopril/HCTZ.  4. Hyperlipidemia.  The patient is to continue her home course of      simvastatin 20 mg p.o. nightly.  5. Gastroesophageal reflux disease.  The patient is to continue on      Prilosec 20 mg p.o. once daily.  6.  Depression.  The patient is to continue on her home course of      Elavil.  7. Questionable anxiety.  This may be worked up by her PCP on an      outpatient basis.  Some of the shortness of breath on presentation      may be anxiety related.  Currently, the patient is not on any      anxiolytic medication.   DISCHARGE INSTRUCTIONS:  1. Activity:  Increase activity slowly.  2. Diet:  Low-sodium, heart-healthy/carbohydrate-modified diet.  3. The patient to return to work on Aug 16, 2008.  4. Wound care not applicable.   FOLLOWUP APPOINTMENTS:  The patient is to see Dr. Deirdre Peer at the [facility] on August 28, 2008, at 11:25.   DISCHARGE CONDITION:  The patient is discharged home in stable medical  condition.      Angeline Slim, MD  Electronically Signed      Leighton Roach McDiarmid, M.D.  Electronically Signed    CT/MEDQ  D:  08/17/2008  T:  08/17/2008  Job:  [id number]   cc:   Drue Dun, M.D.

## 2010-08-04 NOTE — H&P (Signed)
[name], [name]               ACCOUNT NO.:  [id number]   MEDICAL RECORD NO.:  [id number]          PATIENT TYPE:  INP   LOCATION:  6705                         FACILITY:  [facility]   PHYSICIAN:  Leighton Roach McDiarmid, M.D.DATE OF BIRTH:  1960/09/16   DATE OF ADMISSION:  08/14/2008  DATE OF DISCHARGE:                              HISTORY & PHYSICAL   CHIEF COMPLAINT:  Shortness of breath.   HISTORY OF PRESENT ILLNESS:  This is a 50 year old female with  hoarseness since yesterday.  She also had minimal shortness of breath  with walking yesterday.  This morning she was very short of breath while  dressing.  Her shortness of breath improved a little bit with rest.  She  says she has shortness of breath right now.  She does not have chest  pain or pressure at this moment.  She complains of a dry cough.  There  is no fever.  She has no swelling.  She is not currently taking her  Allegra because it is not ready at the [facility] until later this  week.  She has taken the Zyrtec.  She is a former smoker.  Her mother  had a heart attack at age 49 and a grandmother had a fatal heart attack  at age 13.  Denies nausea or vomiting.  There is no diaphoresis.  At the  end of this visit, she started to complain of left chest cramping that  she has had for the past 2 days.  She has no stressors she says;  however, her primary care physician tells me that she recently went back  to work yesterday.   PAST MEDICAL HISTORY:  1. Type 2 diabetes.  2. Hypertension.  3. GERD.  4. Hypercholesterolemia.  5. Allergic rhinitis.  6. Obesity.  7. Depression.  8. Adjustment disorder.   SURGICAL HISTORY:  Tubal ligation, cholecystectomy.   MEDICATIONS:  1. Aspirin 81 mg daily.  2. Lisinopril/hydrochlorothiazide 20/25 daily.  3. Glipizide 10 mg p.o. b.i.d.  4. Nitroglycerin p.r.n.  5. Prilosec 20 mg p.o. daily.  6. Flexeril 10 mg at bedtime p.r.n.  7. Flonase 50 mcg 1-2 sprays each nostril b.i.d.  8.  Zyrtec 10 mg p.o. at bedtime.  9. Ibuprofen 600 mg q.6 h. p.r.n. pain.  10.Metformin 1000 mg p.o. b.i.d.  11.Simvastatin 20 mg at bedtime.  12.Tramadol 50 mg p.o. at bedtime p.r.n. back pain.  13.Gabapentin 100 mg in the morning, 300 mg at bedtime.  14.Allegra 180 mg p.o. daily.  15.Amitriptyline 100 mg p.o. at bedtime.   FAMILY HISTORY:  Coronary artery disease including MIs at age 89 and 50,  diabetes, obesity, and hypertension.   SOCIAL HISTORY:  She lives in [place] and is divorced.  She has 3  children.  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  She  has a history of tobacco use with an 8-pack-year history, but she quit  in 1993.  No alcohol, no drugs.  She tends to walk for exercise.   REVIEW OF SYSTEMS:  As in HPI as well as no fevers,  no chills, no sinus  pressure, no sore throat.  She does complain of low back pain.   PHYSICAL EXAMINATION:  VITAL SIGNS:  Weight 269 pounds, temperature  99.1, heart rate 98, blood pressure is 136/95.  GENERAL:  Not in acute distress.  She is tachypneic.  HEAD:  Normocephalic, atraumatic.  EYES:  Pupils equal, round, and reactive to light and accommodation.  Extraocular muscles intact.  EARS:  Normal tympanic membranes.  NOSE:  Normal.  No deformities.  MOUTH:  Oral mucosa and oropharynx were without lesions.  LUNGS:  Clear to auscultation.  No wheezing, no crackles; however, the  patient is tachypneic, although there is no increased work of breathing.  CARDIOVASCULAR:  Tachycardic.  No rubs, gallops, or murmurs.  ABDOMEN:  Soft and nontender.  EXTREMITIES:  Trace lower extremity nonpitting edema.  NEURO:  Alert and oriented x3.  Cranial nerves II through XII grossly  intact.  Gait is normal.  SKIN:  Turgor is normal.  No rashes.  PSYCHIATRY:  The patient is moderately anxious and tearful.   EKG, normal sinus rhythm with sinus arrhythmia, inferior infarct, age  undetermined.  There is a Q-wave in aVF.   ASSESSMENT AND  PLAN:  1. Shortness of breath:  Acute onset.  Differential diagnosis includes      pneumonia, pulmonary edema, MI, arrhythmia, anxiety.  The patient      does appear anxious and is tearful during this visit and just      started back to work yesterday, this could easily be an anxiety      component.  However, significant risk factors that include      diabetes, hypertension, strong family history of MI at a young in a      female.  Also, she is an 8-pack-year smoker and is obese.  Her      lipids are at goal.  We will admit her for telemonitoring to rule      out an MI.  We will check a BMET, CBC, chest x-ray, 2-D echo.  We      will start aspirin 81 mg daily.  There is no need to initiate acute      coronary syndrome pathway at this point.  We will cycle her enzymes      and treat as needed.  Consider cardiology consult for chemical      stress test as the patient is without insurance and it will be      difficult to do in outpatient setting.  2. Diabetes:  Continue glipizide, hold the metformin until the first      set of cardiac enzymes are back if she may need a cardiac      catheterization.  We can add on sliding scale insulin if needed.  3. Hypertension.  We will continue her home meds,      lisinopril/hydrochlorothiazide.  4. Hypercholesterolemia.  We will continue her simvastatin.  5. Peripheral neuropathy.  We will continue her gabapentin.  6. Shoulder pain, chronic.  I did not continue Flexeril, but this      could be added.  I did not continue ibuprofen due to concern for      coronary artery disease.  I did continue her tramadol.  7. Gastroesophageal reflux disease.  We are going to switch to      Protonix, this is on the hospital formulary.  8. Depression.  Of late, she has an anxiety component.  We can  consider adding a benzo or add an SSRI/SNRI.  For now, I will      continue amitriptyline.  9. Prophylaxis.  We will switch to Protonix, inadvertently I did not       write for heparin.  The inpatient team will have to do so.   DISPOSITION:  Pending MI, rule out and cardiac workup.      Johney Maine, M.D.  Electronically Signed      Leighton Roach McDiarmid, M.D.  Electronically Signed    JT/MEDQ  D:  08/14/2008  T:  08/15/2008  Job:  [id number]

## 2010-08-07 NOTE — Discharge Summary (Signed)
[name], [name]               ACCOUNT NO.:  [id number]   MEDICAL RECORD NO.:  [id number]          PATIENT TYPE:  INP   LOCATION:  4713                         FACILITY:  [facility]   PHYSICIAN:  Asencion Partridge, M.D.     DATE OF BIRTH:  July 04, 1960   DATE OF ADMISSION:  10/13/2004  DATE OF DISCHARGE:  10/15/2004                                 DISCHARGE SUMMARY   DISCHARGE DIAGNOSES:  1.  Atypical angina.  2.  Type 2 diabetes.  3.  Gastroesophageal reflux disease.  4.  Obstructive sleep apnea.  5.  Hypertension.  6.  Allergic rhinitis.  7.  Anemia.   CURRENT MEDICATIONS:  1.  Metformin 500 mg p.o. b.i.d.  2.  Glucotrol 10 mg p.o. daily.  3.  Aspirin 81 mg p.o. daily.  4.  Enalapril 5 mg p.o. daily.  5.  Zyrtec 10 mg p.r.n.  6.  Prilosec 40 mg p.o. daily.  7.  Nitroglycerin 0.4 mg sublingual as needed for chest pain every 5 minutes      x3.  8.  Lipitor 10 mg p.o. daily.   PROCEDURES:  Echocardiogram done on October 14, 2004 demonstrated an ejection  fraction of 65%.  Study was completely normal, other than mild mitral  annular calcification.   HISTORY OF PRESENT ILLNESS:  This is a 50 year old African American female  with a past medical history of hypertension, diabetes and a family history  of coronary artery disease, who presented to the ED with complaints of chest  pain and dyspnea on exertion.  This pain was apparently somewhat relieved by  rest and not affected much by nitroglycerin.  The pain was sharp and left-  sided, rather than squeezing and heavy.   PHYSICAL EXAMINATION:  On exam, lungs were clear to auscultation.  Heart  rate was normal without murmurs and she had trace lower extremity edema.   ADMISSION LABORATORIES:  Chest x-ray within normal limits with possible mild  cardiac enlargement.   EKG had normal sinus rhythm with a heart rate around 100.  She had flipped T  waves in V4 nd V5 which were old, but no other acute changes.   Cardiac enzymes were  negative x3.  White blood cell count was 6.0,  hemoglobin was low at 10.3.  TSH was within normal limits.  D-dimer was  negative.  Hemoglobin A1c was 9.9.  Occult blood negative.   HOSPITAL COURSE:  PROBLEM #1 - ATYPICAL ANGINA:  The patient was ruled out  for an MI; however, due to her multiple risk factors for myocardial  infarction, Cardiology was consulted and they recommended doing an  outpatient Cardiolite stress test which was set up on the day of discharge  at [facility].  They also recommended starting her on Lipitor;  though her LDH was 74, she had multiple risk factors as mentioned above.  We  continued the patient on aspirin and ACE inhibitor, and nitroglycerin as  needed.  An echo was done which was normal with an EF of 65%.  The patient  continued to have intermittent sharp left-sided and arm pain  lasting only a  few seconds, but without associated shortness of breath, nausea, vomiting or  diaphoresis.   PROBLEM #2 - TYPE 2 DIABETES:  The patient's blood sugars were elevated in  the 160s to 190s during her hospitalization.  Her hemoglobin A1c was 9.9.  We increased her Metformin from 500 daily to 500 twice daily and we also  increased her Glucotrol from 5 mg to 10 mg daily.   PROBLEM #3 - REFLUX:  This was stable throughout hospitalization.  We  continued her on Prilosec 40 mg daily.   PROBLEM #4 - OBSTRUCTIVE SLEEP APNEA:  The patient described symptoms  consistent with obstructive sleep apnea where she momentarily stops  breathing during her sleep.  This possible diagnosis would increase her risk  for MI and she will probably need to have an outpatient sleep study  arranged.   PROBLEM #5 - HYPERTENSION:  The patient's blood pressure was actually on the  low side during hospitalization, so we held her hydrochlorothiazide.  This  might need to be restarted at a later time when she is followed up as an  outpatient.   PROBLEM #6 - ANEMIA:  The patient had a  microcytic anemia and iron panel was  pending at the time of discharge.  The patient may have some iron deficiency  that might need supplementation started as an outpatient.   FOLLOWUP INSTRUCTIONS:  1.  The patient has an appointment for a stress Myoview at [facility]      [facility] today.  2.  She has a followup appointment with Dr. Providence Lanius on October 30, 2004 at      9:15.  3.  She is to take all medications as prescribed.  4.  Her primary care physician is to follow up on her iron panel.  5.  She is advised to adhere to a heart-healthy and diabetic diet.      Krist   KS/MEDQ  D:  10/15/2004  T:  10/15/2004  Job:  [id number]   cc:   Devra Dopp, MD  Fax: [phone]   [facility]

## 2010-08-07 NOTE — H&P (Signed)
NAMEDEREK, HUNEYCUTT               ACCOUNT NO.:  [id number]   MEDICAL RECORD NO.:  [id number]          PATIENT TYPE:  INP   LOCATION:  4713                         FACILITY:  [facility]   PHYSICIAN:  Melina Fiddler, MD DATE OF BIRTH:  07-Apr-1960   DATE OF ADMISSION:  10/13/2004  DATE OF DISCHARGE:                                HISTORY & PHYSICAL   CHIEF COMPLAINT:  Dyspnea and diaphoresis.   HISTORY OF PRESENT ILLNESS:  Patient is a 50 year old African-American  female with a past medical history outlined below who presents to ED via EMS  with complaints of chest pain and dyspnea.  She also reports dyspnea and  diaphoresis on exertion.  She had no chest pain last night but she reports  sharp substernal chest pain as well as diaphoresis and dyspnea this morning.  She was eating with a friend and the pain started while walking after eating  her meal.  Apparently the pain was somewhat relieved with rest but patient  called EMS.  She was given nitro and aspirin and is currently without  symptoms.   REVIEW OF SYSTEMS:  She does report that she feels hot a lot and sweats.  CARDIOVASCULAR:  As above.  PULMONARY:  As above.  GI:  She has occasional  nausea.  No vomiting or diarrhea.  She denies dysuria or hematuria.  She has  no rash.  She does not report any leg swelling or pain.  No recent surgery.  No cancer history or hemoptysis.   PAST MEDICAL HISTORY:  1.  Diabetes type 2.  2.  Obesity.  3.  Hypertension.  4.  Reflux disease.  5.  Allergic rhinitis.  6.  Dysmenorrhea.   SURGICAL HISTORY:  She had cholecystectomy in 2003.  She had a tubal  ligation in 2003.  She had a Cardiolite in 2004 after a positive exercise  test that revealed an EF of 64%, no ischemia, and was a low risk study.   FAMILY HISTORY:  The patient's mother died at age 78.  She had heart  problems and had an MI at age 99.  She also had diabetes.  The patient's  father is still alive.  He has hypertension.  The  patient's sister has  diabetes and is insulin-dependent.  Patient's aunt has diabetes and is on  hemodialysis.  Her maternal grandmother died of an MI at age 56.   SOCIAL HISTORY:  She lives in [place].  Recently divorced.  Has three  children.  She lives with her two grandchildren.  Is working two jobs.  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  She quit tobacco  seven years ago.  She denies alcohol or drug use.   ALLERGIES:  None.   MEDICATIONS:  1.  Aspirin 81 mg daily.  2.  Calcium.  3.  Enalapril 5 mg daily.  4.  Glucotrol XL 5 mg daily.  5.  HCTZ 25 mg daily.  6.  Metformin 500 mg daily.  7.  Prilosec 20 mg daily.  8.  Zyrtec as needed.  9.  Multivitamin daily.  10. Nitroglycerin which is rarely used.   PHYSICAL EXAMINATION:  VITAL SIGNS:  Temperature 90.6, heart rate 103, blood  pressure 116/46, respirations 22, saturation 97% on 2 L nasal cannula.  GENERAL:  Obese African-American female in no apparent distress.  She is  awake, alert, oriented x3 and her mood and affect are appropriate.  HEENT:  [place]/AT.  PERRL.  Moist oropharynx.  CHEST:  Clear to auscultation bilaterally.  Normal effort.  CARDIOVASCULAR:  Regular rate.  Normal S1 and S2.  No murmurs.  ABDOMEN:  Soft, nontender, nondistended.  No masses.  EXTREMITIES:  Trace lower extremity edema.  No asymmetry.  No tenderness.  Pulses are difficult to palpate due to her habitus.  NEUROLOGIC:  Grossly intact.  SKIN:  No rash and not jaundiced.   LABORATORY DATA:  White blood cell count 5.9, hemoglobin 11.1, platelets  374, MCV is 76.2.  Sodium 137, potassium 3.4, chloride 103, bicarbonate 27,  BUN 12, creatinine 1, glucose 206.  AST 26, ALT 28, alkaline phosphatase 68,  bilirubin 0.5.  Total protein 7.0, albumin 3.2.  INR 0.9, PTT 22.  First set  of cardiac enzymes are negative in the ED.  D-dimer is 0.3.   IMAGING:  Chest x-ray shows no active process, possible mild cardiac   enlargement.   EKG:  Normal sinus rhythm.  Heart rate is around to 100.  She has flipped T-  waves in V4 and 5 which are old.  She has no acute changes.   ASSESSMENT/PLAN:  A 50 year old female with multiple risk factors admitted  for chest pain and dyspnea.  1.  Chest pain, dyspnea.  Does not appear to be acute myocardial infarction,      although angina/unstable angina versus pulmonary embolism are in the      differential and need to be worked up.  Her chest pain sounds atypical      as it is worsened with exertion and relieved with rest/nitroglycerin,      but is not a squeezing or pressure in nature.  Her dyspnea could be      consistent with PE but given her Well's criteria score is very low      probability and the negative D-dimer we have essentially ruled out a      pulmonary embolism.  Other possibilities include reflux disease and we      will start Protonix as she is already on Prilosec.  Given her risk      factors in the past that she was possibly to have a Cardiolite next      week, however it is more likely this study was just to be an      echocardiogram and the patient was confused, we will contact cardiology      in the morning to evaluate.  2.  Hypertension.  This seems controlled but that is after nitroglycerin.      We will continue the home regimen and monitor.  3.  Diabetes.  We will continue the home regimen except hold Glucotrol for      now to avoid hypoglycemia as      patient may be n.p.o. for testing.  Consider increased metformin as her      A1C has been increased  4.  Obesity.  Patient complains of drowsiness.  She has been told her      breathing is off by her grandchildren.  She will probably need a sleep  study as an outpatient.      T   TV/MEDQ  D:  10/14/2004  T:  10/14/2004  Job:  [id number]

## 2010-08-07 NOTE — Consult Note (Signed)
NAMELILLIANNE, [name]               ACCOUNT NO.:  [id number]   MEDICAL RECORD NO.:  [id number]          PATIENT TYPE:  INP   LOCATION:  4713                         FACILITY:  [facility]   PHYSICIAN:  Olga Millers, M.D. LHCDATE OF BIRTH:  1960/05/18   DATE OF CONSULTATION:  10/14/2004  DATE OF DISCHARGE:                                   CONSULTATION   PRIMARY CARE PHYSICIAN:  Dr. Autumn Messing.   CARDIOLOGIST:  New to our service, Dr. Jens Som   SUMMARY OF HISTORY:  [name] is a 50 year old African-American female who  was admitted through [facility] Emergency Room for evaluation of chest  discomfort and shortness of breath.  She describes an approximately one-  month history of left shoulder and neck discomfort with occasional radiation  into her left arm.  She has attributed this to arthritis, although it seems  to be worse at night when she is just relaxing.  Feels slightly better with  exertion.  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  She did not have any left shoulder or neck discomfort.  She took a break  from work and her symptoms subsided.  She did not have any further episodes  throughout Monday evening and Tuesday morning.  On Tuesday afternoon at  approximately 2 p.m. after spending four hours shopping while walking out to  the car she developed a cold sweat followed by left shoulder and neck sharp  pains associated with shortness of breath.  She gave this a 5 on a scale of  0-10.  She felt that it lasted less than five minutes.  She rested in the  car and returned home.  While unloading the car she had the same symptoms,  but more intense at an 8 on a scale of 0-10.  She called her primary care  physician who recommended calling 911.   EMS was summoned.  On their arrival her blood pressure was 139/79, pulse  103, respirations 20.  She was placed on a monitor, O2, received aspirin  as  well as three sublingual nitroglycerin with relief of her discomfort.  EMS'  EKG did not show any acute findings.  She was admitted to the hospital for  further evaluation.   PAST MEDICAL HISTORY:  No known drug allergies.   MEDICATIONS PRIOR TO ADMISSION:  1.  Aspirin 81 mg daily.  2.  Vasotec 5 mg daily.  3.  Glucotrol XL 5 mg daily.  4.  HCTZ 25 mg p.o. daily.  5.  Lasix 20 mg p.r.n. while she is on her period.  6.  Metformin 500 mg daily.  7.  Multiple vitamin daily.  8.  Prilosec 20 daily.  9.  Zyrtec 10 daily p.r.n.  10. Nitroglycerin 0.4 p.r.n. (of which she has not used).   Her history is notable for diabetes x1 year.  She started prescriptions  approximately a week ago.  She denies any associated opathies and her  glucoses at home have ranged from 124-350.  She has a diagnosis of  hypertension for the preceding four years, allergic rhinitis, GERD, status  post cholecystectomy, bilateral tubal ligation, and appendectomy.  She had a  stress test by Dr. Perley Jain in August 2004 and this was negative according  to the report and the patient.   SOCIAL HISTORY:  She resides in [place] with her 51 and 97 year old niece  in a home.  She has two jobs as a Lawyer.  She has been divorced since 2003.  She has three children and nine grandchildren all in good health.  She quit  smoking approximately seven years ago.  Prior to that she smoked a pack per  day for eight years.  She denies any alcohol, drugs, herbal medications.  She maintains a low sodium, low carbohydrate, and low sugar diet.  She  intermittently walks with her patients for exercise.   FAMILY HISTORY:  Mother died at the age of 94 secondary to CHF.  She had a  myocardial infarction at age of 75 with a history of hypertension and  diabetes.  Her father is alive and well at 16 with a history of  hypertension.  She has one sister alive, 45, with a history of diabetes, two  half-sisters and two brothers that are all  alive and well.   REVIEW OF SYSTEMS:  Notable for night sweats, 5 pound weight loss in the  last two to three weeks, occasional headache, sinus congestion, bifocal  glasses, the need for dental work, nonproductive cough, pedal edema only  with menstruation, occasional palpitations which she could not elaborate on,  dyspnea on exertion x1 month with recent decreased provocation, nocturia,  bilateral hip arthralgias especially with exertion, occasional nausea, GERD,  positive snoring, and according to the daughter, positive sleep apnea.   PHYSICAL EXAMINATION:  GENERAL:  Well-nourished, well-developed, pleasant  African-American female in no apparent distress.  Daughter is present.  VITAL SIGNS:  Temperature 98.1, blood pressure 99/63, pulse 80 and regular,  respirations 20, 99% saturation on room air, weight 272 pounds.  HEENT:  Unremarkable except for poor dentition.  NECK:  Supple without thyromegaly, adenopathy, JVD, or carotid bruits.  CHEST:  Symmetrical excursion, clear to auscultation.  HEART:  PMI is not displaced.  Regular rate and rhythm with a normal S1 and  S2.  Do not appreciate any murmurs, rubs, clicks, or gallops.  ABDOMEN:  Bowel sounds present without organomegaly, masses, or tenderness.  She is obese.  EXTREMITIES:  Trace edema without cyanosis or clubbing.  2+ DP and PT pulses  bilaterally.  SKIN/INTEGUMENT:  Intact.  MUSCULOSKELETAL:  She has slight tenderness over left anterior shoulder.  NEUROLOGIC:  Intact.   LABORATORIES:  Chest x-ray shows no active disease.  EKG shows normal sinus  rhythm with a ventricular rate of 99, nonspecific ST-T wave changes, normal  intervals.  Compared to an old EKG there does not seem to be any changes;  however, on her old EKG it appears it has been incorrectly hooked up in V1.  H&H is 11.1 and 34.6, platelets 374, WBC 5.9, MCV 76.2, MCHC normal at 32.2. Sodium 137, potassium 3.4, BUN 12, creatinine 1, glucose 206.  Normal LFTs.   CK-MB and troponin have been negative x1.  PTT is 22, PT 12.6, INR 0.9.  Hemoglobin A1C is 9.9.  TSH 0.865.  Total cholesterol 126, triglycerides 99,  HDL 40, LDL 76.  Echocardiogram performed on October 14, 2004 shows EF at 55-  65% without wall motion abnormalities, mild MAC.   CURRENT MEDICATIONS:  1.  Aspirin 81 mg daily.  2.  Vasotec 5 daily.  3.  Glucophage 500 mg daily.  4.  Protonix 40 mg daily.  5.  Potassium which she has received 80 mEq total.  6.  Tylenol p.r.n.   IMPRESSION:  1.  Atypical left shoulder discomfort for cardiac etiology.  However, she      has multiple risk factors.  2.  Hyperglycemia with a history of diabetes and elevated hemoglobin A1C      most recently started on medications.  3.  Hypokalemia.  She was on diuretics prior to admission, however, being      supplemented at this time.  4.  Microcytic anemia.  Unknown baseline.  May be related to periods.  5.  Obesity.  6.  Remote tobacco use.   RECOMMENDATIONS:  Dr. Jens Som reviewed the patient's history, spoke with  and examined the patient and agrees with above.  We will check a BNP and  give her a small dose of Lasix since she appears to have trace edema on  examination.  Would also consider with her obesity and possible obstructive  sleep apnea an outpatient sleep study.  Given her diabetes, would recommend  continuing ACE inhibitor as well as aspirin, but would also add a Statin  despite her lipid panel.  We have arranged for an outpatient stress  Cardiolite to be performed on Tuesday, October 27, 2004 at 12:30 p.m.  The  patient is to be n.p.o. after midnight on October 26, 2004.  However, she may  take her medications with a small sip of water.  If she is unable to achieve  predicted maximum heart rate we will change this to an adenosine heart rate.  Given her weight we will arrange this to be a two-day protocol.  She will be  followed up in the office with Dr. Ludwig Clarks physician assistant on  August  16 at 11 a.m.      Irving Burton   EW/MEDQ  D:  10/14/2004  T:  10/14/2004  Job:  [id number]

## 2010-08-29 ENCOUNTER — Other Ambulatory Visit: Payer: Self-pay | Admitting: Family Medicine

## 2010-10-08 ENCOUNTER — Other Ambulatory Visit: Payer: Self-pay | Admitting: Family Medicine

## 2010-10-08 NOTE — Telephone Encounter (Signed)
Refill request

## 2010-11-16 ENCOUNTER — Other Ambulatory Visit: Payer: Self-pay | Admitting: Internal Medicine

## 2010-11-16 DIAGNOSIS — Z1231 Encounter for screening mammogram for malignant neoplasm of breast: Secondary | ICD-10-CM

## 2010-11-24 ENCOUNTER — Telehealth: Payer: Self-pay | Admitting: Family Medicine

## 2010-11-24 NOTE — Telephone Encounter (Signed)
[facility] was calling to obtain prior auth for pt however we did not refer the pt to their practice she stated that [facility] referred her. I told her that since we are not the referring office she will need to obtain their NPI# for authorization.Loralee Pacas [place]over

## 2010-12-18 ENCOUNTER — Ambulatory Visit
Admission: RE | Admit: 2010-12-18 | Discharge: 2010-12-18 | Disposition: A | Discharge status: Home / Self Care | Payer: Medicare Other | Source: Ambulatory Visit | Attending: Internal Medicine | Admitting: Internal Medicine

## 2010-12-18 DIAGNOSIS — Z1231 Encounter for screening mammogram for malignant neoplasm of breast: Secondary | ICD-10-CM

## 2011-07-11 ENCOUNTER — Other Ambulatory Visit: Payer: Self-pay | Admitting: Family Medicine

## 2011-10-18 ENCOUNTER — Other Ambulatory Visit: Payer: Self-pay | Admitting: Internal Medicine

## 2011-10-18 DIAGNOSIS — N63 Unspecified lump in unspecified breast: Secondary | ICD-10-CM

## 2011-10-25 ENCOUNTER — Ambulatory Visit
Admission: RE | Admit: 2011-10-25 | Discharge: 2011-10-25 | Disposition: A | Discharge status: Home / Self Care | Payer: Medicare Other | Source: Ambulatory Visit | Attending: Internal Medicine | Admitting: Internal Medicine

## 2011-10-25 DIAGNOSIS — N63 Unspecified lump in unspecified breast: Secondary | ICD-10-CM

## 2011-10-26 ENCOUNTER — Other Ambulatory Visit: Payer: Self-pay | Admitting: Family Medicine

## 2011-11-09 ENCOUNTER — Other Ambulatory Visit: Payer: Self-pay | Admitting: Family Medicine

## 2012-01-04 ENCOUNTER — Other Ambulatory Visit: Payer: Self-pay | Admitting: Internal Medicine

## 2012-01-04 DIAGNOSIS — Z1231 Encounter for screening mammogram for malignant neoplasm of breast: Secondary | ICD-10-CM

## 2012-02-08 ENCOUNTER — Ambulatory Visit
Admission: RE | Admit: 2012-02-08 | Discharge: 2012-02-08 | Disposition: A | Discharge status: Home / Self Care | Payer: Medicare Other | Source: Ambulatory Visit | Attending: Internal Medicine | Admitting: Internal Medicine

## 2012-02-08 DIAGNOSIS — Z1231 Encounter for screening mammogram for malignant neoplasm of breast: Secondary | ICD-10-CM

## 2012-06-19 ENCOUNTER — Emergency Department (INDEPENDENT_AMBULATORY_CARE_PROVIDER_SITE_OTHER)
Admission: EM | Admit: 2012-06-19 | Discharge: 2012-06-19 | Disposition: A | Discharge status: Home / Self Care | Payer: Medicare Other | Source: Home / Self Care | Attending: Emergency Medicine | Admitting: Emergency Medicine

## 2012-06-19 ENCOUNTER — Encounter (HOSPITAL_COMMUNITY): Payer: Self-pay | Admitting: *Deleted

## 2012-06-19 DIAGNOSIS — R197 Diarrhea, unspecified: Secondary | ICD-10-CM

## 2012-06-19 HISTORY — DX: Type 2 diabetes mellitus without complications: E11.9

## 2012-06-19 HISTORY — DX: Essential (primary) hypertension: I10

## 2012-06-19 LAB — POCT I-STAT, CHEM 8
Calcium, Ion: 1.14 mmol/L (ref 1.12–1.23)
Chloride: 93 mEq/L — ABNORMAL LOW (ref 96–112)
HCT: 49 % — ABNORMAL HIGH (ref 36.0–46.0)
Hemoglobin: 16.7 g/dL — ABNORMAL HIGH (ref 12.0–15.0)
TCO2: 35 mmol/L (ref 0–100)

## 2012-06-19 MED ORDER — DIPHENOXYLATE-ATROPINE 2.5-0.025 MG PO TABS: 1.0000 | ORAL_TABLET | Freq: Four times a day (QID) | ORAL | Status: DC | PRN

## 2012-06-19 NOTE — Discharge Instructions (Signed)
Diarrhea Infections caused by germs (bacterial) or a virus commonly cause diarrhea. Your caregiver has determined that with time, rest and fluids, the diarrhea should improve. In general, eat normally while drinking more water than usual. Although water may prevent dehydration, it does not contain salt and minerals (electrolytes). Broths, weak tea without caffeine and oral rehydration solutions (ORS) replace fluids and electrolytes. Small amounts of fluids should be taken frequently. Large amounts at one time may not be tolerated. Plain water may be harmful in infants and the elderly. Oral rehydrating solutions (ORS) are available at pharmacies and grocery stores. ORS replace water and important electrolytes in proper proportions. Sports drinks are not as effective as ORS and may be harmful due to sugars worsening diarrhea.  ORS is especially recommended for use in children with diarrhea. As a general guideline for children, replace any new fluid losses from diarrhea and/or vomiting with ORS as follows:  If your child weighs 22 pounds or under (10 kg or less), give 60-120 mL ( -  cup or 2 - 4 ounces) of ORS for each episode of diarrheal stool or vomiting episode.  If your child weighs more than 22 pounds (more than 10 kgs), give 120-240 mL ( - 1 cup or 4 - 8 ounces) of ORS for each diarrheal stool or episode of vomiting.  While correcting for dehydration, children should eat normally. However, foods high in sugar should be avoided because this may worsen diarrhea. Large amounts of carbonated soft drinks, juice, gelatin desserts and other highly sugared drinks should be avoided.  After correction of dehydration, other liquids that are appealing to the child may be added. Children should drink small amounts of fluids frequently and fluids should be increased as tolerated. Children should drink enough fluids to keep urine clear or pale yellow.  Adults should eat normally while drinking more fluids than  usual. Drink small amounts of fluids frequently and increase as tolerated. Drink enough fluids to keep urine clear or pale yellow. Broths, weak decaffeinated tea, lemon lime soft drinks (allowed to go flat) and ORS replace fluids and electrolytes.  Avoid:  Carbonated drinks.  Juice.  Extremely hot or cold fluids.  Caffeine drinks.  Fatty, greasy foods.  Alcohol.  Tobacco.  Too much intake of anything at one time.  Gelatin desserts.  Probiotics are active cultures of beneficial bacteria. They may lessen the amount and number of diarrheal stools in adults. Probiotics can be found in yogurt with active cultures and in supplements.  Wash hands well to avoid spreading bacteria and virus.  Anti-diarrheal medications are not recommended for infants and children.  Only take over-the-counter or prescription medicines for pain, discomfort or fever as directed by your caregiver. Do not give aspirin to children because it may cause Reye's Syndrome.  For adults, ask your caregiver if you should continue all prescribed and over-the-counter medicines.  If your caregiver has given you a follow-up appointment, it is very important to keep that appointment. Not keeping the appointment could result in a chronic or permanent injury, and disability. If there is any problem keeping the appointment, you must call back to this facility for assistance. SEEK IMMEDIATE MEDICAL CARE IF:   You or your child is unable to keep fluids down or other symptoms or problems become worse in spite of treatment.  Vomiting or diarrhea develops and becomes persistent.  There is vomiting of blood or bile (green material).  There is blood in the stool or the stools are black and   tarry.  There is no urine output in 6-8 hours or there is only a small amount of very dark urine.  Abdominal pain develops, increases or localizes.  You have a fever.  Your baby is older than 3 months with a rectal temperature of 102 F  (38.9 C) or higher.  Your baby is 3 months old or younger with a rectal temperature of 100.4 F (38 C) or higher.  You or your child develops excessive weakness, dizziness, fainting or extreme thirst.  You or your child develops a rash, stiff neck, severe headache or become irritable or sleepy and difficult to awaken. MAKE SURE YOU:   Understand these instructions.  Will watch your condition.  Will get help right away if you are not doing well or get worse. Document Released: 02/26/2002 Document Revised: 05/31/2011 Document Reviewed: 01/13/2009 Exit[employer] Patient Information 2013 [employer].  

## 2012-06-19 NOTE — ED Provider Notes (Signed)
History     CSN: [id number]  Arrival date & time 06/19/12  1807   First MD Initiated Contact with Patient 06/19/12 1900      Chief Complaint  Patient presents with  . Generalized Body Aches    (Consider location/radiation/quality/duration/timing/severity/associated sxs/prior treatment) Patient is a 52 y.o. female presenting with diarrhea. The history is provided by the patient. No language interpreter was used.  Diarrhea Quality:  Watery Severity:  Severe Number of episodes:  Multiple Duration:  2 days Timing:  Constant Progression:  Worsening Relieved by:  Nothing Worsened by:  Nothing tried Ineffective treatments:  None tried Associated symptoms: myalgias   Associated symptoms: no abdominal pain     Past Medical History  Diagnosis Date  . Diabetes mellitus without complication   . Hypertension     Past Surgical History  Procedure Laterality Date  . Cholecystectomy    . Btl      No family history on file.  History  Substance Use Topics  . Smoking status: Never Smoker   . Smokeless tobacco: Not on file  . Alcohol Use: No    OB History   Grav Para Term Preterm Abortions TAB SAB Ect Mult Living                  Review of Systems  Gastrointestinal: Positive for diarrhea. Negative for abdominal pain.  Musculoskeletal: Positive for myalgias.  All other systems reviewed and are negative.    Allergies  Review of patient's allergies indicates not on file.  Home Medications   Current Outpatient Rx  Name  Route  Sig  Dispense  Refill  . acetaminophen (TYLENOL) 325 MG tablet   Oral   Take 325 mg by mouth 2 (two) times daily.           Marland Kitchen amitriptyline (ELAVIL) 100 MG tablet   Oral   Take 100 mg by mouth at bedtime.           Marland Kitchen aspirin 81 MG EC tablet   Oral   Take 81 mg by mouth daily.           . Calcium Carbonate-Vitamin D (CALTRATE 600+D) 600-400 MG-UNIT per tablet   Oral   Take 1 tablet by mouth 2 (two) times daily.           .  cyclobenzaprine (FLEXERIL) 10 MG tablet      TAKE 1 TABLET BY MOUTH AT BEDTIME AS NEEDED BACK PAIN   30 tablet   1   . fluticasone (FLONASE) 50 MCG/ACT nasal spray   Nasal   1 spray by Nasal route 2 (two) times daily as needed. For allergy symptoms          . furosemide (LASIX) 20 MG tablet   Oral   Take 20 mg by mouth daily. For fluid retention/swelling          . gabapentin (NEURONTIN) 100 MG capsule   Oral   Take 100 mg by mouth 4 (four) times daily.           Marland Kitchen glipiZIDE (GLUCOTROL) 10 MG tablet   Oral   Take 10 mg by mouth 2 (two) times daily.           Marland Kitchen glucose blood (PRODIGY TEST) test strip   Other   1 each by Other route as needed. Use as instructed          . insulin glargine (LANTUS) 100 UNIT/ML injection   Subcutaneous   Inject  into the skin as directed. 48 units twice a day. Dispense QS for 3 month supply.          . Insulin Pen Needle (B-D ULTRAFINE III SHORT PEN) 31G X 8 MM MISC   Does not apply   by Does not apply route. dispense enough for once daily dosing.          . Insulin Syringe-Needle U-100 (B-D INS SYR ULTRAFINE 1CC/30G) 30G X 1/2" 1 ML MISC   Does not apply   by Does not apply route. dispense enough for once daily dosing.          . Liraglutide (VICTOZA) 18 MG/3ML SOLN   Subcutaneous   Inject into the skin as directed. Inject once daily. Dose is 1.2 daily.  dispense 3 month supply.          . loratadine (CLARITIN) 10 MG tablet   Oral   Take 10 mg by mouth daily.           . meloxicam (MOBIC) 15 MG tablet      TAKE ONE TABLET DAILY WITH FOOD FOR PAIN   30 tablet   1   . metFORMIN (GLUCOPHAGE) 500 MG tablet   Oral   Take 500 mg by mouth 2 (two) times daily with meals.           . metoprolol (LOPRESSOR) 50 MG tablet   Oral   Take 50 mg by mouth 2 (two) times daily.           . nitroGLYCERIN (NITROSTAT) 0.4 MG SL tablet   Sublingual   Place 0.4 mg under the tongue every 5 (five) minutes as needed. Place 1  under tongue if chest pain.  If pain does NOT resolve in 5 min Call 911 and take 2nd.  Take a 3rd dose if pain continues.          . potassium chloride (KLOR-CON) 20 MEQ packet   Oral   Take 20 mEq by mouth daily. With furosemide          . quinapril-hydrochlorothiazide (ACCURETIC) 20-25 MG per tablet   Oral   Take 1 tablet by mouth daily.           . simvastatin (ZOCOR) 40 MG tablet   Oral   Take 40 mg by mouth at bedtime.             BP 152/65  Pulse 110  Temp(Src) 101.8 F (38.8 C) (Oral)  Resp 16  SpO2 95%  Physical Exam  Constitutional: She is oriented to person, place, and time. She appears well-developed and well-nourished.  HENT:  Head: Normocephalic and atraumatic.  Mouth/Throat: Oropharynx is clear and moist.  Eyes: EOM are normal. Pupils are equal, round, and reactive to light.  Neck: Normal range of motion.  Cardiovascular: Normal rate.   Pulmonary/Chest: Effort normal.  Abdominal: Soft. Bowel sounds are normal. She exhibits no distension.  Musculoskeletal: Normal range of motion.  Neurological: She is alert and oriented to person, place, and time.  Skin: Skin is warm.  Psychiatric: She has a normal mood and affect.    ED Course  Procedures (including critical care time)  Labs Reviewed  POCT I-STAT, CHEM 8 - Abnormal; Notable for the following:    Chloride 93 (*)    Glucose, Bld 193 (*)    Hemoglobin 16.7 (*)    HCT 49.0 (*)    All other components within normal limits   No results found.  1. Abdominal pain, acute   2. Nausea       MDM          Elson Areas, PA-C 06/19/12 1934

## 2012-06-19 NOTE — ED Notes (Signed)
Pt  Has  Symptoms  Of body  Aches   As  Well  As fever         And  Diarrhea       Symptoms  Since  Saturday         She  rdenys  Any  Vomiting      She  Reports  Last  Dose  Of tylenol  As    Was  Given          Today               Skin is  Warm and  Dry       Pt  Is  Awake  And  Alert  And  Oriented             Sitting  Upright  On  Exam table  Table             denys  Any     Vomiting

## 2012-06-19 NOTE — ED Provider Notes (Signed)
Medical screening examination/treatment/procedure(s) were performed by non-physician practitioner and as supervising physician I was immediately available for consultation/collaboration.  Raynald Blend, MD 06/19/12 2017

## 2013-01-09 ENCOUNTER — Other Ambulatory Visit: Payer: Self-pay

## 2013-01-09 DIAGNOSIS — Z1231 Encounter for screening mammogram for malignant neoplasm of breast: Secondary | ICD-10-CM

## 2013-02-08 ENCOUNTER — Ambulatory Visit
Admission: RE | Admit: 2013-02-08 | Discharge: 2013-02-08 | Disposition: A | Discharge status: Home / Self Care | Payer: Medicare Other | Source: Ambulatory Visit

## 2013-02-08 DIAGNOSIS — Z1231 Encounter for screening mammogram for malignant neoplasm of breast: Secondary | ICD-10-CM

## 2014-01-09 ENCOUNTER — Other Ambulatory Visit: Payer: Self-pay

## 2014-01-09 DIAGNOSIS — Z1239 Encounter for other screening for malignant neoplasm of breast: Secondary | ICD-10-CM

## 2014-01-09 DIAGNOSIS — Z1231 Encounter for screening mammogram for malignant neoplasm of breast: Secondary | ICD-10-CM

## 2014-02-11 ENCOUNTER — Ambulatory Visit
Admission: RE | Admit: 2014-02-11 | Discharge: 2014-02-11 | Disposition: A | Discharge status: Home / Self Care | Payer: Medicare Other | Source: Ambulatory Visit

## 2014-02-11 DIAGNOSIS — Z1231 Encounter for screening mammogram for malignant neoplasm of breast: Secondary | ICD-10-CM

## 2014-10-29 ENCOUNTER — Encounter: Payer: Self-pay | Admitting: Gastroenterology

## 2015-01-08 ENCOUNTER — Other Ambulatory Visit: Payer: Self-pay

## 2015-01-08 DIAGNOSIS — Z1231 Encounter for screening mammogram for malignant neoplasm of breast: Secondary | ICD-10-CM

## 2015-02-17 ENCOUNTER — Ambulatory Visit: Payer: Medicare Other

## 2015-03-02 ENCOUNTER — Emergency Department (HOSPITAL_COMMUNITY)
Admission: EM | Admit: 2015-03-02 | Discharge: 2015-03-02 | Disposition: A | Discharge status: Home / Self Care | Payer: Medicare Other | Attending: Physician Assistant | Admitting: Physician Assistant

## 2015-03-02 ENCOUNTER — Encounter (HOSPITAL_COMMUNITY): Payer: Self-pay

## 2015-03-02 ENCOUNTER — Emergency Department (HOSPITAL_COMMUNITY): Payer: Medicare Other

## 2015-03-02 ENCOUNTER — Other Ambulatory Visit: Payer: Self-pay

## 2015-03-02 DIAGNOSIS — I10 Essential (primary) hypertension: Secondary | ICD-10-CM | POA: Diagnosis not present

## 2015-03-02 DIAGNOSIS — M1990 Unspecified osteoarthritis, unspecified site: Secondary | ICD-10-CM | POA: Diagnosis not present

## 2015-03-02 DIAGNOSIS — R1013 Epigastric pain: Secondary | ICD-10-CM | POA: Diagnosis not present

## 2015-03-02 DIAGNOSIS — N63 Unspecified lump in unspecified breast: Secondary | ICD-10-CM

## 2015-03-02 DIAGNOSIS — M546 Pain in thoracic spine: Secondary | ICD-10-CM | POA: Diagnosis not present

## 2015-03-02 DIAGNOSIS — R079 Chest pain, unspecified: Secondary | ICD-10-CM | POA: Diagnosis present

## 2015-03-02 DIAGNOSIS — E119 Type 2 diabetes mellitus without complications: Secondary | ICD-10-CM | POA: Diagnosis not present

## 2015-03-02 DIAGNOSIS — Z7982 Long term (current) use of aspirin: Secondary | ICD-10-CM | POA: Diagnosis not present

## 2015-03-02 DIAGNOSIS — R0602 Shortness of breath: Secondary | ICD-10-CM | POA: Diagnosis not present

## 2015-03-02 DIAGNOSIS — Z794 Long term (current) use of insulin: Secondary | ICD-10-CM | POA: Insufficient documentation

## 2015-03-02 DIAGNOSIS — Z7984 Long term (current) use of oral hypoglycemic drugs: Secondary | ICD-10-CM | POA: Insufficient documentation

## 2015-03-02 DIAGNOSIS — Z79899 Other long term (current) drug therapy: Secondary | ICD-10-CM | POA: Diagnosis not present

## 2015-03-02 HISTORY — DX: Unspecified osteoarthritis, unspecified site: M19.90

## 2015-03-02 LAB — BASIC METABOLIC PANEL
ANION GAP: 11 (ref 5–15)
BUN: 14 mg/dL (ref 6–20)
CO2: 26 mmol/L (ref 22–32)
Calcium: 9.5 mg/dL (ref 8.9–10.3)
Chloride: 102 mmol/L (ref 101–111)
Creatinine, Ser: 0.91 mg/dL (ref 0.44–1.00)
GFR calc Af Amer: >60 mL/min (ref >60)
Glucose, Bld: 110 mg/dL — ABNORMAL HIGH (ref 65–99)
Potassium: 3.7 mmol/L (ref 3.5–5.1)
Sodium: 139 mmol/L (ref 135–145)

## 2015-03-02 LAB — CBC
HCT: 45.1 % (ref 36.0–46.0)
HEMOGLOBIN: 14.1 g/dL (ref 12.0–15.0)
MCH: 27 pg (ref 26.0–34.0)
MCHC: 31.3 g/dL (ref 30.0–36.0)
MCV: 86.4 fL (ref 78.0–100.0)
Platelets: 323 10*3/uL (ref 150–400)
RBC: 5.22 MIL/uL — AB (ref 3.87–5.11)
RDW: 14.1 % (ref 11.5–15.5)
WBC: 6.7 10*3/uL (ref 4.0–10.5)

## 2015-03-02 LAB — I-STAT TROPONIN, ED: TROPONIN I, POC: 0 ng/mL (ref 0.00–0.08)

## 2015-03-02 MED ORDER — DIPHENHYDRAMINE HCL 50 MG/ML IJ SOLN
25.0000 mg | Freq: Once | INTRAMUSCULAR | Status: AC
  Administered 2015-03-02: 25 mg via INTRAVENOUS
  Filled 2015-03-02: qty 1

## 2015-03-02 MED ORDER — FENTANYL CITRATE (PF) 100 MCG/2ML IJ SOLN
50.0000 ug | Freq: Once | INTRAMUSCULAR | Status: AC
  Administered 2015-03-02: 50 ug via INTRAVENOUS
  Filled 2015-03-02: qty 2

## 2015-03-02 MED ORDER — NAPROXEN 500 MG PO TABS: 500.0000 mg | ORAL_TABLET | Freq: Two times a day (BID) | ORAL | Status: DC

## 2015-03-02 MED ORDER — PANTOPRAZOLE SODIUM 20 MG PO TBEC: 40.0000 mg | DELAYED_RELEASE_TABLET | Freq: Every day | ORAL | Status: DC

## 2015-03-02 MED ORDER — HYDROCODONE-ACETAMINOPHEN 5-325 MG PO TABS: 1.0000 | ORAL_TABLET | Freq: Once | ORAL | Status: DC

## 2015-03-02 MED ORDER — PANTOPRAZOLE SODIUM 40 MG IV SOLR
40.0000 mg | Freq: Once | INTRAVENOUS | Status: AC
  Administered 2015-03-02: 40 mg via INTRAVENOUS
  Filled 2015-03-02: qty 40

## 2015-03-02 NOTE — ED Notes (Signed)
Patient transported to X-ray 

## 2015-03-02 NOTE — ED Notes (Signed)
Onset 3 weeks ago pt felt lump on right side of breast at 3 o'oclock that is sore.  Pt has mammogram scheduled for 03-04-15.  Onset 1 week ago pain has radiated underneath right axilla to mid upper back, constant.  Pt was sitting in church today and felt short of breath, increased with exertion.  Pt is talking in complete sentences, NAD at triage.

## 2015-03-02 NOTE — ED Notes (Signed)
Unsuccessful lab draw attempt x2 

## 2015-03-02 NOTE — Discharge Instructions (Signed)
It was my pleasure taking care of you today!   1. Medications: Protonix daily, naproxen for back pain - best taken with meals to avoid an upset stomach, continue usual home medications 2. Treatment: rest, drink plenty of fluids, avoid foods which can irritate stomach including spicy or greasy foods, caffeine, chocolate, and alcohol.  3. Follow Up: Please follow up with your primary doctor in 3 days for discussion of your diagnoses and further evaluation after today's visit; if you do not have a primary care doctor use the resource guide provided to find one; Please keep scheduled mammogram appointment. Please return to the ER for shortness of breath, any new or worsening symptoms, any additional concerns   Emergency Department Resource Guide 1) Find a Doctor and Pay Out of Pocket Although you won't have to find out who is covered by your insurance plan, it is a good idea to ask around and get recommendations. You will then need to call the office and see if the doctor you have chosen will accept you as a new patient and what types of options they offer for patients who are self-pay. Some doctors offer discounts or will set up payment plans for their patients who do not have insurance, but you will need to ask so you aren't surprised when you get to your appointment.  2) Contact Your Local Health Department Not all health departments have doctors that can see patients for sick visits, but many do, so it is worth a call to see if yours does. If you don't know where your local health department is, you can check in your phone book. The CDC also has a tool to help you locate your state's health department, and many state websites also have listings of all of their local health departments.  3) Find a [facility] If your illness is not likely to be very severe or complicated, you may want to try a walk in clinic. These are popping up all over the country in pharmacies, drugstores, and shopping centers.  They're usually staffed by nurse practitioners or physician assistants that have been trained to treat common illnesses and complaints. They're usually fairly quick and inexpensive. However, if you have serious medical issues or chronic medical problems, these are probably not your best option.  No Primary Care Doctor: - Call Health Connect at  [phone] - they can help you locate a primary care doctor that  accepts your insurance, provides certain services, etc. - Physician Referral Service- [phone]  Chronic Pain Problems: Organization         Address  Phone   Notes  [facility]  [phone] Patients need to be referred by their primary care doctor.   Medication Assistance: Organization         Address  Phone   Notes  [facility] Medication [facility] [address] [place], [place] [address] [phone] --Must be a resident of [facility] -- Must have NO insurance coverage whatsoever (no Medicaid/ Medicare, etc.) -- The pt. MUST have a primary care doctor that directs their care regularly and follows them in the community   MedAssist  [phone]   [employer]  [phone]    Agencies that provide inexpensive medical care: Organization         Address  Phone   Notes  [facility]  [phone]   [facility]    [phone]   [facility]  Outpatient Clinic [place], [place] [address] [phone]   [place] [address], [place] [phone]   Planned Parenthood    [phone]   [facility]    [phone]   [place] and [address], [place] Phone:  [phone], Fax:  [phone] Hours of Operation:  9 am - 6 pm, M-F.  Also accepts Medicaid/Medicare and self-pay.  [facility] for [place], [address], [place] Phone: [phone], Fax: [phone]. Hours of Operation:  8:30 am - 5:30 pm, M-F.  Also accepts Medicaid and self-pay.  [facility] [place] [address], [place] Phone: [phone]   [place], [place], [place] [phone], Ext. [phone] Mondays & Thursdays: 7-9 AM.  First 15 patients are seen on a first come, first serve basis.    [place] Providers:  Organization         Address  Phone   Notes  [facility] [address], [place] [phone] Also accepts self-pay patients.  [facility] [id number] [place], [place]  [phone]   [place], [address], [place] [phone]   [facility] [address] [place] [phone]   Lucianne Lei [address], [place]   [phone] Only accepts [place] Access [place] patients after they have their name applied to their card.   Self-Pay (no insurance) in [facility]:  Organization         Address  Phone   Notes  Sickle Cell Patients, [facility] Internal Medicine [place] [phone]   [facility] Urgent Care [place] [phone]   [facility] [place]  [place], [place], [place] [phone]   [facility]/Dr. Osei-Bonsu  [address] [place] or [address], [place] [phone] Phone number for both [place] and [place] locations is the same.  Urgent Medical and [facility] [address] [place] [phone]   [facility] [address], [place] or [address] [phone]   [facility] [address], [place] [phone], phone; [phone], fax Sees patients 1st and 3rd Saturday of every month.  Must not qualify for public or private insurance (i.e.  Medicaid, Medicare, [place] Health Choice, Veterans' Benefits)  Household income should be no more than 200% of the poverty level The clinic cannot treat you if you are pregnant or think you are pregnant  Sexually transmitted diseases are not treated at the clinic.    Dental Care: Organization         Address  Phone  Notes  [facility] Department of [facility] [place] [phone] Accepts children up to age 59 who are enrolled in [place] or [place]; pregnant women with a Medicaid card; and children who have applied for Medicaid or [place] Health Choice, but were declined, whose parents can pay a reduced fee at time of service.  [facility] Department of [facility]  [address], [place] [phone] Accepts children up to age 64 who are enrolled  in Medicaid or [place] Health Choice; pregnant women with a Medicaid card; and children who have applied for Medicaid or [place] Health Choice, but were declined, whose parents can pay a reduced fee at time of service.  [place] Adult Dental Access PROGRAM  [place] [phone] Patients are seen by appointment only. Walk-ins are not accepted. [place] will see patients 45 years of age and older. Monday - Tuesday (8am-5pm) Most Wednesdays (8:30-5pm) $30 per visit, cash only  [facility] Adult Dental Access PROGRAM  [address], [facility] [phone] Patients are seen by appointment only. Walk-ins are not accepted. [place] will see patients 34 years of age and older. One Wednesday Evening (Monthly: Volunteer Based).  $30 per visit, cash only  [place]  [phone] for adults; Children under age 104, call Graduate Pediatric Dentistry at [phone]. Children aged 45-14, please call [phone] to request a pediatric application.  Dental services are provided in all areas of dental care including fillings,  crowns and bridges, complete and partial dentures, implants, gum treatment, root canals, and extractions. Preventive care is also provided. Treatment is provided to both adults and children. Patients are selected via a lottery and there is often a waiting list.   [facility] [address] [place]  [phone] [email or web address]   [facility] [address] [place], [place] [phone], Ext. [phone] Second and Fourth Thursday of each month, opens at 6:30 AM; Clinic ends at 9 AM.  Patients are seen on a first-come first-served basis, and a limited number are seen during each clinic.   [facility]  [address] Hillard Danker [place], [place] [phone]   Eligibility Requirements You must have lived in [place], [place], or [place] counties for at least the last three months.   You cannot be eligible for state or federal sponsored [employer], including [employer], [place], or [employer].   You generally cannot be eligible for healthcare insurance through your employer.    How to apply: Eligibility screenings are held every Tuesday and Wednesday afternoon from 1:00 pm until 4:00 pm. You do not need an appointment for the interview!  [facility] [address] [place], [place]   [place]  [place] Department  [place]  [phone]    Behavioral Health Resources in the Community: Intensive Outpatient Programs Organization         Address  Phone  Notes  [place]. [address] [place], [place] [phone]   [facility] [address] [place], [place]   ADS: Alcohol & Drug Svcs [address], [place], [place]   [place] [address]  [place], [place] or [phone]   Substance Abuse  Resources Organization         Address  Phone  Notes  Alcohol and Drug Services  [phone]   [place]  [phone]   [facility] [place]   Chinita Pester  [phone]   Residential & Outpatient Substance Abuse Program  [phone]   Psychological Services Organization         Address  Phone  Notes  [facility] [place]  [place]  [phone]   [place] [address], [place]  or [phone]    Mobile Crisis Teams Organization         Address  Phone  Notes  [facility], Mobile Crisis Care Unit  [phone]   [facility] Psychotherapeutic Services  [address] [place], [place]   [facility] [address], [place] [phone]    Self-Help/Support Groups Organization         Address  Phone             Notes  [place]. of [place] - variety of support groups  Brilliant Call for more information  Narcotics Anonymous (NA), [facility] [address], [employer] [place]  2 meetings at this location   Special educational needs teacher         Address  Phone  Notes  [facility]AP Residential Treatment [place],    [place]  [phone]   [facility]  [address] [place] [id number], [place], [place]   [place], [place] [phone] Admissions: 8am-3pm M-F  Incentives Substance [place] [address]    [place], [place] [phone]   [facility] [address] [place], [place], [place]   [facility] [address]  [place], [place]   [facility] - Intensive Outpatient [address] Kristeen Mans [address], [place], [place]   [facility] ([place] [address]  [place], [place] [phone] or [phone]   [facility]idential Treatment Services (RTS) [address] [place], [place] Accepts Medicaid  [facility] [place] [address]  [place] [phone] Substance Abuse/Addiction Treatment   [facility] Organization         Address  Phone  Notes  [facility]  [phone]   Domenic Schwab, PhD [address] Arlis Porta [place], [place]   [phone] or [phone]   [place], [place] [phone]   [facility] [address], [place], [place] [phone] Insurance/Medicaid/sponsorship through [employer] and Families [address] [place]                                    [place], [place] [phone] Tat [place]oli [address], [place] [phone]    Dr. Adele Schilder  [phone]   [facility] Dept. 1) [address], [place] 2) [place] 3)  [place] [address], [place] [phone]  [phone]   [place] [phone] or [phone] (After Hours)

## 2015-03-02 NOTE — ED Notes (Signed)
Pt is in stable condition upon d/c and is escorted from ED via wheelchair. 

## 2015-03-02 NOTE — ED Provider Notes (Signed)
CSN: [id number]     Arrival date & time 03/02/15  1358 History   First MD Initiated Contact with Patient 03/02/15 1438     Chief Complaint  Patient presents with  . Shortness of Breath  . Chest Pain     (Consider location/radiation/quality/duration/timing/severity/associated sxs/prior Treatment) Patient is a 54 y.o. female presenting with shortness of breath and chest pain. The history is provided by the patient and medical records. No language interpreter was used.  Shortness of Breath Associated symptoms: chest pain   Associated symptoms: no abdominal pain, no cough, no neck pain, no rash, no sore throat, no vomiting and no wheezing   Chest Pain Associated symptoms: back pain and shortness of breath   Associated symptoms: no abdominal pain, no cough, no dizziness, no nausea, no palpitations, not vomiting and no weakness     [name] is a 54 y.o. female  with a PMH of DM, HTN, arthritis who presents to the Emergency Department complaining of persistent sharp chest/epigastric pain x 1 week. Denies radiation of pain. She had one episode of shortness of breath today while sitting in church which has now resolved. No alleviating or aggravating factors noted. Only medication taken PTA is baby ASA. Patient felt lump of right breast at the 3 oclock position - states this has popped up off-and-on for the past two years. She has seen primary doc for this and had two mammograms which were reassuring. Patient was told this was likely "cystic". Patient is also complaining of right sided mid back pain today. No saddle anesthesia, no b/b incontinence, no numbness or tingling.   Past Medical History  Diagnosis Date  . Diabetes mellitus without complication ([place])   . Hypertension   . Arthritis    Past Surgical History  Procedure Laterality Date  . Cholecystectomy    . Btl     History reviewed. No pertinent family history. Social History  Substance Use Topics  . Smoking status: Never  Smoker   . Smokeless tobacco: None  . Alcohol Use: No   OB History    No data available     Review of Systems  Constitutional: Negative.   HENT: Negative for congestion, rhinorrhea and sore throat.   Eyes: Negative for visual disturbance.  Respiratory: Positive for shortness of breath. Negative for cough and wheezing.   Cardiovascular: Positive for chest pain. Negative for palpitations and leg swelling.  Gastrointestinal: Negative for nausea, vomiting, abdominal pain, diarrhea and constipation.  Genitourinary: Negative for dysuria and difficulty urinating.  Musculoskeletal: Positive for myalgias and back pain. Negative for neck pain.  Skin: Negative for rash.  Neurological: Negative for dizziness, syncope and weakness.      Allergies  Review of patient's allergies indicates no known allergies.  Home Medications   Prior to Admission medications   Medication Sig Start Date End Date Taking? Authorizing Provider  acetaminophen (TYLENOL) 325 MG tablet Take 325 mg by mouth 2 (two) times daily.      Historical Provider, MD  amitriptyline (ELAVIL) 100 MG tablet Take 100 mg by mouth at bedtime.      Historical Provider, MD  aspirin 81 MG EC tablet Take 81 mg by mouth daily.      Historical Provider, MD  Calcium Carbonate-Vitamin D (CALTRATE 600+D) 600-400 MG-UNIT per tablet Take 1 tablet by mouth 2 (two) times daily.      Historical Provider, MD  cyclobenzaprine (FLEXERIL) 10 MG tablet TAKE 1 TABLET BY MOUTH AT BEDTIME AS NEEDED BACK PAIN  10/08/10   [place], DO  diphenoxylate-atropine (LOMOTIL) 2.5-0.025 MG per tablet Take 1 tablet by mouth 4 (four) times daily as needed for diarrhea or loose stools. 06/19/12   Fransico Meadow, PA-C  fluticasone [facility]) 50 MCG/ACT nasal spray 1 spray by Nasal route 2 (two) times daily as needed. For allergy symptoms     Historical Provider, MD  furosemide (LASIX) 20 MG tablet Take 20 mg by mouth daily. For fluid retention/swelling     Historical  Provider, MD  gabapentin (NEURONTIN) 100 MG capsule Take 100 mg by mouth 4 (four) times daily.      Historical Provider, MD  glipiZIDE (GLUCOTROL) 10 MG tablet Take 10 mg by mouth 2 (two) times daily.      Historical Provider, MD  glucose blood (PRODIGY TEST) test strip 1 each by Other route as needed. Use as instructed     Historical Provider, MD  insulin glargine (LANTUS) 100 UNIT/ML injection Inject into the skin as directed. 48 units twice a day. Dispense QS for 3 month supply.     Historical Provider, MD  Insulin Pen Needle (B-D ULTRAFINE III SHORT PEN) 31G X 8 MM MISC by Does not apply route. dispense enough for once daily dosing.     Historical Provider, MD  Insulin Syringe-Needle U-100 (B-D INS SYR ULTRAFINE 1CC/30G) 30G X 1/2" 1 ML MISC by Does not apply route. dispense enough for once daily dosing.     Historical Provider, MD  Liraglutide (VICTOZA) 18 MG/3ML SOLN Inject into the skin as directed. Inject once daily. Dose is 1.2 daily.  dispense 3 month supply.     Historical Provider, MD  loratadine (CLARITIN) 10 MG tablet Take 10 mg by mouth daily.      Historical Provider, MD  meloxicam (MOBIC) 15 MG tablet TAKE ONE TABLET DAILY WITH FOOD FOR PAIN 06/06/10   Zenia Resides, MD  metFORMIN (GLUCOPHAGE) 500 MG tablet Take 500 mg by mouth 2 (two) times daily with meals.      Historical Provider, MD  metoprolol (LOPRESSOR) 50 MG tablet Take 50 mg by mouth 2 (two) times daily.      Historical Provider, MD  naproxen (NAPROSYN) 500 MG tablet Take 1 tablet (500 mg total) by mouth 2 (two) times daily. 03/02/15   Ozella Almond Ward, PA-C  nitroGLYCERIN (NITROSTAT) 0.4 MG SL tablet Place 0.4 mg under the tongue every 5 (five) minutes as needed. Place 1 under tongue if chest pain.  If pain does NOT resolve in 5 min Call 911 and take 2nd.  Take a 3rd dose if pain continues.     Historical Provider, MD  pantoprazole (PROTONIX) 20 MG tablet Take 2 tablets (40 mg total) by mouth daily. 03/02/15   Ozella Almond Ward, PA-C  potassium chloride (KLOR-CON) 20 MEQ packet Take 20 mEq by mouth daily. With furosemide     Historical Provider, MD  quinapril-hydrochlorothiazide (ACCURETIC) 20-25 MG per tablet Take 1 tablet by mouth daily.      Historical Provider, MD  simvastatin (ZOCOR) 40 MG tablet Take 40 mg by mouth at bedtime.      Historical Provider, MD   BP 118/82 mmHg  Pulse 75  Temp(Src) 98.3 F (36.8 C) (Oral)  Resp 23  Ht 5' (1.524 m)  Wt 122.562 kg  BMI 52.77 kg/m2  SpO2 98% Physical Exam  Constitutional: She is oriented to person, place, and time. She appears well-developed and well-nourished.  Alert and in no acute distress  HENT:  Head: Normocephalic and atraumatic.  Neck: Normal range of motion. Neck supple. No JVD present.  Cardiovascular: Normal rate, regular rhythm, normal heart sounds and intact distal pulses.  Exam reveals no gallop and no friction rub.   No murmur heard. Pulmonary/Chest: Effort normal and breath sounds normal. No respiratory distress. She has no wheezes. She has no rales.    Abdominal: She exhibits no mass. There is no rebound and no guarding.    Abdomen soft, non-distended TTP as depicted in image.  Bowel sounds positive in all four quadrants  Musculoskeletal: She exhibits no edema.       Arms: 5/5 muscle strength in all four extremities  Lymphadenopathy:    She has no cervical adenopathy.  Neurological: She is alert and oriented to person, place, and time. No cranial nerve deficit.  Skin: Skin is warm and dry. No rash noted. She is not diaphoretic.  Psychiatric: She has a normal mood and affect. Her behavior is normal. Judgment and thought content normal.  Nursing note and vitals reviewed.   ED Course  Procedures (including critical care time) Labs Review Labs Reviewed  BASIC METABOLIC PANEL - Abnormal; Notable for the following:    Glucose, Bld 110 (*)    All other components within normal limits  CBC - Abnormal; Notable for the  following:    RBC 5.22 (*)    All other components within normal limits  I-STAT TROPOININ, ED    Imaging Review Dg Chest 2 View  03/02/2015  CLINICAL DATA:  Acute shortness of breath, history of hypertension and diabetes EXAM: CHEST  2 VIEW COMPARISON:  08/14/2008 FINDINGS: The heart size and mediastinal contours are within normal limits. Both lungs are clear. The visualized skeletal structures are unremarkable. Prior cholecystectomy noted. Stable exam. IMPRESSION: No active cardiopulmonary disease. Electronically Signed   By: Jerilynn Mages.  Shick M.D.   On: 03/02/2015 15:32   I have personally reviewed and evaluated these images and lab results as part of my medical decision-making.   EKG Interpretation   Date/Time:  Sunday March 02 2015 14:09:36 EST Ventricular Rate:  86 PR Interval:  154 QRS Duration: 92 QT Interval:  382 QTC Calculation: 457 R Axis:   29 Text Interpretation:  Sinus rhythm with Premature supraventricular  complexes Non-specific ST-t changes Confirmed by Wilson Singer  MD, STEPHEN ([id number])  on 03/02/2015 5:33:16 PM      MDM   Final diagnoses:  Epigastric pain  Right-sided thoracic back pain  Breast nodule   [name] presents with epigastric pain and right back pain. Associated with one episode of sob which has resolved- no episodes of sob while in ED. 96-100% on RA throughout stay. 0 Well's score.   Labs:Trop of 0.0, cbc and bmp reassuring Imaging: CXR shows no acute cardiopulm disease  Therapeutics: Protonix 40 and fentyl 50 given in ED 5:16 PM - Patient re-evaluated and is much improved. Complaining of itching after receiving medications - benadryl given.   A&P:  Epigastric pain: Cardiac etiology unlikely with HEART score of 2, unchanged EKG, nl CXR and trop of 0; likely GERD vs. Gastritis   - Improved with protonix in ED; will discharge to home with protonix rx  - Follow up with primary physician  Right-sided back pain:  - Naproxen for pain  - Follow up  with PCP  Breast nodule: Present and unchanged for 2 years with two negative mammograms  - Keep scheduled mammogram on 03/04/15  [facility] Ward, PA-C 03/02/15 1743  Courteney Julio Alm, MD 03/05/15 [phone]

## 2015-03-04 ENCOUNTER — Ambulatory Visit
Admission: RE | Admit: 2015-03-04 | Discharge: 2015-03-04 | Disposition: A | Discharge status: Home / Self Care | Payer: Medicare Other | Source: Ambulatory Visit

## 2015-03-04 DIAGNOSIS — Z1231 Encounter for screening mammogram for malignant neoplasm of breast: Secondary | ICD-10-CM

## 2015-03-05 ENCOUNTER — Other Ambulatory Visit: Payer: Self-pay | Admitting: Internal Medicine

## 2015-03-05 DIAGNOSIS — R928 Other abnormal and inconclusive findings on diagnostic imaging of breast: Secondary | ICD-10-CM

## 2015-03-18 ENCOUNTER — Ambulatory Visit
Admission: RE | Admit: 2015-03-18 | Discharge: 2015-03-18 | Disposition: A | Discharge status: Home / Self Care | Payer: Medicare Other | Source: Ambulatory Visit | Attending: Internal Medicine | Admitting: Internal Medicine

## 2015-03-18 DIAGNOSIS — R928 Other abnormal and inconclusive findings on diagnostic imaging of breast: Secondary | ICD-10-CM

## 2015-08-14 ENCOUNTER — Other Ambulatory Visit: Payer: Self-pay | Admitting: Internal Medicine

## 2015-08-14 DIAGNOSIS — R921 Mammographic calcification found on diagnostic imaging of breast: Secondary | ICD-10-CM

## 2015-09-24 ENCOUNTER — Ambulatory Visit
Admission: RE | Admit: 2015-09-24 | Discharge: 2015-09-24 | Disposition: A | Discharge status: Home / Self Care | Payer: Medicare Other | Source: Ambulatory Visit | Attending: Internal Medicine | Admitting: Internal Medicine

## 2015-09-24 DIAGNOSIS — R921 Mammographic calcification found on diagnostic imaging of breast: Secondary | ICD-10-CM

## 2015-11-15 ENCOUNTER — Other Ambulatory Visit: Payer: Self-pay | Admitting: Internal Medicine

## 2015-11-15 DIAGNOSIS — E2839 Other primary ovarian failure: Secondary | ICD-10-CM

## 2015-11-27 ENCOUNTER — Ambulatory Visit
Admission: RE | Admit: 2015-11-27 | Discharge: 2015-11-27 | Disposition: A | Discharge status: Home / Self Care | Payer: Medicare Other | Source: Ambulatory Visit | Attending: Internal Medicine | Admitting: Internal Medicine

## 2015-11-27 DIAGNOSIS — E2839 Other primary ovarian failure: Secondary | ICD-10-CM

## 2016-01-22 ENCOUNTER — Other Ambulatory Visit: Payer: Self-pay | Admitting: Internal Medicine

## 2016-01-22 DIAGNOSIS — R921 Mammographic calcification found on diagnostic imaging of breast: Secondary | ICD-10-CM

## 2016-03-04 ENCOUNTER — Ambulatory Visit
Admission: RE | Admit: 2016-03-04 | Discharge: 2016-03-04 | Disposition: A | Discharge status: Home / Self Care | Payer: Medicare Other | Source: Ambulatory Visit | Attending: Internal Medicine | Admitting: Internal Medicine

## 2016-03-04 DIAGNOSIS — R921 Mammographic calcification found on diagnostic imaging of breast: Secondary | ICD-10-CM

## 2017-02-02 ENCOUNTER — Other Ambulatory Visit: Payer: Self-pay | Admitting: Internal Medicine

## 2017-02-02 DIAGNOSIS — Z1231 Encounter for screening mammogram for malignant neoplasm of breast: Secondary | ICD-10-CM

## 2017-03-02 ENCOUNTER — Ambulatory Visit
Admission: RE | Admit: 2017-03-02 | Discharge: 2017-03-02 | Disposition: A | Discharge status: Home / Self Care | Payer: Medicare HMO | Source: Ambulatory Visit | Attending: Internal Medicine | Admitting: Internal Medicine

## 2017-03-02 DIAGNOSIS — Z1231 Encounter for screening mammogram for malignant neoplasm of breast: Secondary | ICD-10-CM

## 2017-04-01 ENCOUNTER — Ambulatory Visit
Admission: RE | Admit: 2017-04-01 | Discharge: 2017-04-01 | Disposition: A | Discharge status: Home / Self Care | Payer: Medicare HMO | Source: Ambulatory Visit | Attending: Internal Medicine | Admitting: Internal Medicine

## 2018-02-22 ENCOUNTER — Other Ambulatory Visit: Payer: Self-pay | Admitting: Internal Medicine

## 2018-02-22 DIAGNOSIS — Z1231 Encounter for screening mammogram for malignant neoplasm of breast: Secondary | ICD-10-CM

## 2018-04-03 ENCOUNTER — Ambulatory Visit
Admission: RE | Admit: 2018-04-03 | Discharge: 2018-04-03 | Disposition: A | Discharge status: Home / Self Care | Payer: Medicare HMO | Source: Ambulatory Visit | Attending: Internal Medicine | Admitting: Internal Medicine

## 2018-04-03 DIAGNOSIS — Z1231 Encounter for screening mammogram for malignant neoplasm of breast: Secondary | ICD-10-CM

## 2019-01-02 ENCOUNTER — Other Ambulatory Visit: Payer: Self-pay | Admitting: Internal Medicine

## 2019-01-02 DIAGNOSIS — E2839 Other primary ovarian failure: Secondary | ICD-10-CM

## 2019-01-08 ENCOUNTER — Other Ambulatory Visit: Payer: Self-pay | Admitting: Internal Medicine

## 2019-01-08 DIAGNOSIS — Z1231 Encounter for screening mammogram for malignant neoplasm of breast: Secondary | ICD-10-CM

## 2019-04-06 ENCOUNTER — Ambulatory Visit
Admission: RE | Admit: 2019-04-06 | Discharge: 2019-04-06 | Disposition: A | Discharge status: Home / Self Care | Payer: Medicare HMO | Source: Ambulatory Visit | Attending: Internal Medicine | Admitting: Internal Medicine

## 2019-04-06 ENCOUNTER — Other Ambulatory Visit: Payer: Self-pay

## 2019-04-06 DIAGNOSIS — E2839 Other primary ovarian failure: Secondary | ICD-10-CM

## 2019-04-06 DIAGNOSIS — Z1231 Encounter for screening mammogram for malignant neoplasm of breast: Secondary | ICD-10-CM

## 2019-05-19 ENCOUNTER — Ambulatory Visit: Payer: Medicare HMO | Attending: Internal Medicine

## 2019-05-19 DIAGNOSIS — Z23 Encounter for immunization: Secondary | ICD-10-CM | POA: Insufficient documentation

## 2019-05-19 NOTE — Progress Notes (Signed)
   Covid-19 Vaccination Clinic  Name:  [name]    MRN: [id number] DOB: [birth year]  05/19/2019  [name] was observed post Covid-19 immunization for 15 minutes without incidence. She was provided with Vaccine Information Sheet and instruction to access the V-Safe system.   [name] was instructed to call 911 with any severe reactions post vaccine: Marland Kitchen Difficulty breathing  . Swelling of your face and throat  . A fast heartbeat  . A bad rash all over your body  . Dizziness and weakness    Immunizations Administered    Name Date Dose VIS Date Route   Pfizer COVID-19 Vaccine 05/19/2019  5:50 PM 0.3 mL 03/02/2019 Intramuscular   Manufacturer: [place]   Lot: [id number]   [place]: [id number]

## 2019-05-21 ENCOUNTER — Ambulatory Visit: Payer: Self-pay

## 2019-05-21 ENCOUNTER — Ambulatory Visit: Payer: Medicare HMO | Admitting: Orthopaedic Surgery

## 2019-05-21 ENCOUNTER — Other Ambulatory Visit: Payer: Self-pay

## 2019-05-21 ENCOUNTER — Telehealth: Payer: Self-pay

## 2019-05-21 ENCOUNTER — Encounter: Payer: Self-pay | Admitting: Orthopaedic Surgery

## 2019-05-21 ENCOUNTER — Ambulatory Visit (INDEPENDENT_AMBULATORY_CARE_PROVIDER_SITE_OTHER): Payer: Medicare HMO

## 2019-05-21 VITALS — Ht 61.0 in | Wt 275.0 lb

## 2019-05-21 DIAGNOSIS — M25561 Pain in right knee: Secondary | ICD-10-CM

## 2019-05-21 DIAGNOSIS — M1711 Unilateral primary osteoarthritis, right knee: Secondary | ICD-10-CM

## 2019-05-21 DIAGNOSIS — M25562 Pain in left knee: Secondary | ICD-10-CM | POA: Diagnosis not present

## 2019-05-21 DIAGNOSIS — M1712 Unilateral primary osteoarthritis, left knee: Secondary | ICD-10-CM

## 2019-05-21 DIAGNOSIS — G8929 Other chronic pain: Secondary | ICD-10-CM | POA: Insufficient documentation

## 2019-05-21 DIAGNOSIS — Z6841 Body Mass Index (BMI) 40.0 and over, adult: Secondary | ICD-10-CM | POA: Insufficient documentation

## 2019-05-21 MED ORDER — LIDOCAINE HCL 1 % IJ SOLN
3.0000 mL | INTRAMUSCULAR | Status: AC | PRN
  Administered 2019-05-21: 3 mL

## 2019-05-21 MED ORDER — METHYLPREDNISOLONE ACETATE 40 MG/ML IJ SUSP
40.0000 mg | INTRAMUSCULAR | Status: AC | PRN
  Administered 2019-05-21: 40 mg via INTRA_ARTICULAR

## 2019-05-21 NOTE — Progress Notes (Signed)
Office Visit Note   Patient: [name]           Date of Birth: [birth year]           MRN: [id number] Visit Date: 05/21/2019              Requested by: Nolene Ebbs, MD [address] [place],  [place] [address] PCP: Nolene Ebbs, MD   Assessment & Plan: Visit Diagnoses:  1. Chronic pain of right knee   2. Chronic pain of left knee   3. BMI 50.0-59.9, adult ([place])   4. Unilateral primary osteoarthritis, left knee   5. Unilateral primary osteoarthritis, right knee     Plan: The patient understands that she does have severe end-stage arthritis of both her knees.  I did feel it was worthwhile to provide a steroid injection in both knees today.  I also counseled her on weight loss and quad strengthening exercises.  It is worth trying hyaluronic acid injections in both knees and hopefully her insurance company will allow [place] to do this in 4 weeks from now with trying any conservative modalities to try to help her knees while she is losing weight.  I would like another weight and BMI calculated at her next visit.  I went over x-rays with her in detail and talked about her knees.  All question concerns were answered and addressed.  Follow-Up Instructions: Return in about 4 weeks (around 06/18/2019).   Orders:  Orders Placed This Encounter  Procedures  . Large Joint Inj  . Large Joint Inj  . XR Knee 1-2 Views Right  . XR Knee 1-2 Views Left   No orders of the defined types were placed in this encounter.     Procedures: Large Joint Inj: R knee on 05/21/2019 3:08 PM Indications: diagnostic evaluation and pain Details: 22 G 1.5 in needle, superolateral approach  Arthrogram: No  Medications: 3 mL lidocaine 1 %; 40 mg methylPREDNISolone acetate 40 MG/ML Outcome: tolerated well, no immediate complications Procedure, treatment alternatives, risks and benefits explained, specific risks discussed. Consent was given by the patient. Immediately prior to procedure a time out was  called to verify the correct patient, procedure, equipment, support staff and site/side marked as required. Patient was prepped and draped in the usual sterile fashion.   Large Joint Inj: L knee on 05/21/2019 3:08 PM Indications: diagnostic evaluation and pain Details: 22 G 1.5 in needle, superolateral approach  Arthrogram: No  Medications: 3 mL lidocaine 1 %; 40 mg methylPREDNISolone acetate 40 MG/ML Outcome: tolerated well, no immediate complications Procedure, treatment alternatives, risks and benefits explained, specific risks discussed. Consent was given by the patient. Immediately prior to procedure a time out was called to verify the correct patient, procedure, equipment, support staff and site/side marked as required. Patient was prepped and draped in the usual sterile fashion.       Clinical Data: No additional findings.   Subjective: Chief Complaint  Patient presents with  . Left Knee - Pain  . Right Knee - Pain  The patient is a very pleasant 59 year old female sent from Dr. Jeanie Cooks to evaluate bilateral knee pain.  She has had injections in the past for her knees and they have helped.  Is been a year since she is had injections but now they have not been helping.  Her knee pain is daily and she points to the medial aspect of her knee as a source of her pain.  She is on a concrete floor  5 hours a day working and that is put more pressure on her knees.  She is someone with a BMI of over 50.  She is also diabetic but reports a hemoglobin A1c of 7.2.  She denies any injuries.  This is been getting worse with time for her.  At this point her knee pain is daily and it is 10 out of 10.  Her bilateral knee pain is detrimentally affecting her quality of life, her actives daily living and her mobility.  HPI  Review of Systems She currently denies any headache, chest pain, shortness of breath, fever, chills, nausea, vomiting  Objective: Vital Signs: Ht 5\' 1"  (1.549 m)   Wt 275 lb  (124.7 kg)   BMI 51.96 kg/m   Physical Exam She is alert and orient x3 and in no acute distress but obvious discomfort.  She ambulates very slowly and is morbidly obese. Ortho Exam Examination of both knees show no effusion.  Both knees have varus malalignment with significant medial joint line tenderness.  Both knees have patellofemoral crepitation and are painful throughout the arc of motion.  Both knees are ligamentously stable. Specialty Comments:  No specialty comments available.  Imaging: XR Knee 1-2 Views Left  Result Date: 05/21/2019 2 views left knee show severe tricompartment arthritic changes with varus malalignment and complete loss of medial joint space.  There is also severe patellofemoral disease.  XR Knee 1-2 Views Right  Result Date: 05/21/2019 2 views of the right knee show severe end-stage arthritis with medial joint space narrowing that is bone-on-bone.  There is also severe patellofemoral disease and varus malalignment.    PMFS History: Patient Active Problem List   Diagnosis Date Noted  . BMI 50.0-59.9, adult ([place]) 05/21/2019  . Chronic pain of left knee 05/21/2019  . Chronic pain of right knee 05/21/2019  . Unilateral primary osteoarthritis, left knee 05/21/2019  . Unilateral primary osteoarthritis, right knee 05/21/2019  . A[place]ORDON 11/12/2009  . PERIPHERAL EDEMA 08/01/2009  . Pain in joint, lower leg 07/07/2009  . SINUS TACHYCARDIA 04/09/2009  . BACK PAIN, LUMBOSACRAL, CHRONIC 06/17/2008  . SHOULDER PAIN, RIGHT, CHRONIC 05/21/2008  . PERIPHERAL NEUROPATHY, MILD 03/05/2008  . 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  . DIABETES MELLITUS, TYPE II, UNCONTROLLED 05/19/2006  . HYPERCHOLESTEROLEMIA 05/19/2006  . OBESITY, NOS 05/19/2006  . DEPRESSIVE DISORDER, NOS 05/19/2006  . HYPERTENSION, BENIGN SYSTEMIC 05/19/2006  . RHINITIS, ALLERGIC 05/19/2006  . GASTROESOPHAGEAL REFLUX, NO ESOPHAGITIS 05/19/2006   Past Medical History:  Diagnosis Date  . Arthritis     . Diabetes mellitus without complication ([place])   . Hypertension     History reviewed. No pertinent family history.  Past Surgical History:  Procedure Laterality Date  . btl    . CHOLECYSTECTOMY     Social History   Occupational History  . Not on file  Tobacco Use  . Smoking status: Never Smoker  Substance and Sexual Activity  . Alcohol use: No  . Drug use: No  . Sexual activity: Not on file

## 2019-05-21 NOTE — Telephone Encounter (Signed)
Bilateral knee gel injections 

## 2019-05-22 ENCOUNTER — Telehealth: Payer: Self-pay | Admitting: Orthopaedic Surgery

## 2019-05-22 ENCOUNTER — Telehealth: Payer: Self-pay

## 2019-05-22 NOTE — Telephone Encounter (Signed)
Called and scheduled pt for appointment on 05/24/19 @ 4pm for injection Pt was informed of $25 copay and no cash being excepted and state understanding

## 2019-05-22 NOTE — Telephone Encounter (Signed)
Lvm for pt to call back to inform her of approval and copay

## 2019-05-22 NOTE — Telephone Encounter (Signed)
Approved for Monovisc-Bilateral knee Dr. Margarito Liner and Bill $25 copay 20% OOP Prior auth required with [employer] auth # [id number] Dates: 05/22/19-11/22/19

## 2019-05-22 NOTE — Telephone Encounter (Signed)
Patient called returning call to Autumn H. Mrs. [name] please give patient a call back on phone number [phone].

## 2019-05-22 NOTE — Telephone Encounter (Signed)
Submitted for VOB for monovisc-Bilateral knee

## 2019-05-24 ENCOUNTER — Encounter: Payer: Self-pay | Admitting: Orthopaedic Surgery

## 2019-05-24 ENCOUNTER — Ambulatory Visit (INDEPENDENT_AMBULATORY_CARE_PROVIDER_SITE_OTHER): Payer: Medicare HMO | Admitting: Orthopaedic Surgery

## 2019-05-24 ENCOUNTER — Other Ambulatory Visit: Payer: Self-pay

## 2019-05-24 DIAGNOSIS — M1712 Unilateral primary osteoarthritis, left knee: Secondary | ICD-10-CM

## 2019-05-24 DIAGNOSIS — M170 Bilateral primary osteoarthritis of knee: Secondary | ICD-10-CM

## 2019-05-24 DIAGNOSIS — M1711 Unilateral primary osteoarthritis, right knee: Secondary | ICD-10-CM

## 2019-05-24 MED ORDER — HYALURONAN 88 MG/4ML IX SOSY
88.0000 mg | PREFILLED_SYRINGE | INTRA_ARTICULAR | Status: AC | PRN
  Administered 2019-05-24: 88 mg via INTRA_ARTICULAR

## 2019-05-24 MED ORDER — HYALURONAN 88 MG/4ML IX SOSY
88.0000 mg | PREFILLED_SYRINGE | INTRA_ARTICULAR | Status: AC | PRN
  Administered 2019-05-24: 16:00:00 88 mg via INTRA_ARTICULAR

## 2019-05-24 NOTE — Progress Notes (Signed)
   Procedure Note  Patient: [name]             Date of Birth: 23-Sep-1960           MRN: [id number]             Visit Date: 05/24/2019  Procedures: Visit Diagnoses:  1. Unilateral primary osteoarthritis, left knee   2. Unilateral primary osteoarthritis, right knee     Large Joint Inj: R knee on 05/24/2019 3:57 PM Indications: diagnostic evaluation and pain Details: 22 G 1.5 in needle, superolateral approach  Arthrogram: No  Medications: 88 mg Hyaluronan 88 MG/4ML Outcome: tolerated well, no immediate complications Procedure, treatment alternatives, risks and benefits explained, specific risks discussed. Consent was given by the patient. Immediately prior to procedure a time out was called to verify the correct patient, procedure, equipment, support staff and site/side marked as required. Patient was prepped and draped in the usual sterile fashion.   Large Joint Inj: L knee on 05/24/2019 3:57 PM Indications: diagnostic evaluation and pain Details: 22 G 1.5 in needle, superolateral approach  Arthrogram: No  Medications: 88 mg Hyaluronan 88 MG/4ML Outcome: tolerated well, no immediate complications Procedure, treatment alternatives, risks and benefits explained, specific risks discussed. Consent was given by the patient. Immediately prior to procedure a time out was called to verify the correct patient, procedure, equipment, support staff and site/side marked as required. Patient was prepped and draped in the usual sterile fashion.    The patient is here today for scheduled hyaluronic acid injections in both knees with Monovisc to treat the pain from osteoarthritis.  She has known significant arthritis in both knees.  She has tried and failed other forms of conservative treatment.  She is also morbidly obese with a BMI of 52.  She understands fully why we are trying these injections.  We have also counseled her navigated weight loss which is going to be most appropriate for helping  her deal with the arthritis of her knees.  She understands this as well  I was able to place Monovisc in both knees today without difficulty.  She tolerated the injections well.  I would like to see her back in 3 months for repeat exam and a repeat weight with BMI calculation.

## 2019-06-09 ENCOUNTER — Ambulatory Visit: Payer: Medicare HMO | Attending: Internal Medicine

## 2019-06-09 DIAGNOSIS — Z23 Encounter for immunization: Secondary | ICD-10-CM

## 2019-06-09 NOTE — Progress Notes (Signed)
   Covid-19 Vaccination Clinic  Name:  [name]    MRN: [id number] DOB: [birth year]  06/09/2019  [name] was observed post Covid-19 immunization for 15 minutes without incident. She was provided with Vaccine Information Sheet and instruction to access the V-Safe system.   [name] was instructed to call 911 with any severe reactions post vaccine: Marland Kitchen Difficulty breathing  . Swelling of face and throat  . A fast heartbeat  . A bad rash all over body  . Dizziness and weakness   Immunizations Administered    Name Date Dose VIS Date Route   Pfizer COVID-19 Vaccine 06/09/2019 11:05 AM 0.3 mL 03/02/2019 Intramuscular   Manufacturer: [place]   Lot: [id number]   Chicken: [id number]

## 2019-06-25 ENCOUNTER — Ambulatory Visit: Payer: Medicare HMO | Admitting: Orthopaedic Surgery

## 2019-07-09 ENCOUNTER — Telehealth: Payer: Self-pay | Admitting: Orthopaedic Surgery

## 2019-07-09 NOTE — Telephone Encounter (Signed)
Please advise, we saw her recently for knees?

## 2019-07-09 NOTE — Telephone Encounter (Signed)
Patient called. She would like a note for work stating that she can not lift anything. Her call back number is [phone]

## 2019-07-09 NOTE — Telephone Encounter (Signed)
It is okay to give a note stating that she cannot lift anything greater than 10 pounds due to severe arthritis in both her knees.

## 2019-07-10 NOTE — Telephone Encounter (Signed)
LMOM for patient note ready at front desk

## 2019-08-27 ENCOUNTER — Ambulatory Visit (INDEPENDENT_AMBULATORY_CARE_PROVIDER_SITE_OTHER): Payer: Medicare HMO | Admitting: Orthopaedic Surgery

## 2019-08-27 ENCOUNTER — Other Ambulatory Visit: Payer: Self-pay

## 2019-08-27 ENCOUNTER — Encounter: Payer: Self-pay | Admitting: Orthopaedic Surgery

## 2019-08-27 VITALS — Ht 60.0 in | Wt 273.0 lb

## 2019-08-27 DIAGNOSIS — M25561 Pain in right knee: Secondary | ICD-10-CM

## 2019-08-27 DIAGNOSIS — Z6841 Body Mass Index (BMI) 40.0 and over, adult: Secondary | ICD-10-CM

## 2019-08-27 DIAGNOSIS — M1711 Unilateral primary osteoarthritis, right knee: Secondary | ICD-10-CM

## 2019-08-27 DIAGNOSIS — M170 Bilateral primary osteoarthritis of knee: Secondary | ICD-10-CM

## 2019-08-27 DIAGNOSIS — M1712 Unilateral primary osteoarthritis, left knee: Secondary | ICD-10-CM

## 2019-08-27 DIAGNOSIS — G8929 Other chronic pain: Secondary | ICD-10-CM

## 2019-08-27 DIAGNOSIS — M25562 Pain in left knee: Secondary | ICD-10-CM

## 2019-08-27 NOTE — Progress Notes (Signed)
The patient comes in today for continued evaluation treatment of severe arthritis and pain involving both her knees.  We have provided steroid injections in her knees and most recently in March provided hyaluronic acid in both knees.  She states that has not really helped.  She still reports good blood glucose control but not good weight control.  On examination today both knees have varus malalignment with no effusion.  Both knees have patellofemoral crepitation and significant medial joint line tenderness.  Both knees have good range of motion.  She does have a large thighs on exam.  Her BMI calculated today is 53.  She understands that her weight is preventing [place] from being able to successfully perform knee replacement surgery.  I do feel that it is worthwhile we send her for bariatric counseling and even considering bariatric surgery.  She has never had a referral like this and is interested.  We will work on making those referrals.

## 2019-09-11 ENCOUNTER — Ambulatory Visit (INDEPENDENT_AMBULATORY_CARE_PROVIDER_SITE_OTHER): Payer: Self-pay | Admitting: Family Medicine

## 2019-09-18 ENCOUNTER — Encounter (INDEPENDENT_AMBULATORY_CARE_PROVIDER_SITE_OTHER): Payer: Self-pay | Admitting: Family Medicine

## 2019-09-18 ENCOUNTER — Ambulatory Visit (INDEPENDENT_AMBULATORY_CARE_PROVIDER_SITE_OTHER): Payer: Medicare HMO | Admitting: Family Medicine

## 2019-09-18 ENCOUNTER — Other Ambulatory Visit: Payer: Self-pay

## 2019-09-18 VITALS — BP 124/78 | HR 65 | Temp 98.6°F | Ht 61.0 in | Wt 266.0 lb

## 2019-09-18 DIAGNOSIS — E1159 Type 2 diabetes mellitus with other circulatory complications: Secondary | ICD-10-CM

## 2019-09-18 DIAGNOSIS — Z6841 Body Mass Index (BMI) 40.0 and over, adult: Secondary | ICD-10-CM | POA: Diagnosis not present

## 2019-09-18 DIAGNOSIS — Z794 Long term (current) use of insulin: Secondary | ICD-10-CM

## 2019-09-18 DIAGNOSIS — E1169 Type 2 diabetes mellitus with other specified complication: Secondary | ICD-10-CM

## 2019-09-18 DIAGNOSIS — F3289 Other specified depressive episodes: Secondary | ICD-10-CM | POA: Diagnosis not present

## 2019-09-18 DIAGNOSIS — M170 Bilateral primary osteoarthritis of knee: Secondary | ICD-10-CM

## 2019-09-18 DIAGNOSIS — I10 Essential (primary) hypertension: Secondary | ICD-10-CM

## 2019-09-18 DIAGNOSIS — R5383 Other fatigue: Secondary | ICD-10-CM

## 2019-09-18 DIAGNOSIS — R0602 Shortness of breath: Secondary | ICD-10-CM | POA: Diagnosis not present

## 2019-09-18 DIAGNOSIS — E785 Hyperlipidemia, unspecified: Secondary | ICD-10-CM

## 2019-09-18 DIAGNOSIS — E118 Type 2 diabetes mellitus with unspecified complications: Secondary | ICD-10-CM

## 2019-09-18 DIAGNOSIS — I152 Hypertension secondary to endocrine disorders: Secondary | ICD-10-CM

## 2019-09-18 DIAGNOSIS — Z0289 Encounter for other administrative examinations: Secondary | ICD-10-CM

## 2019-09-18 DIAGNOSIS — E559 Vitamin D deficiency, unspecified: Secondary | ICD-10-CM

## 2019-09-18 DIAGNOSIS — E612 Magnesium deficiency: Secondary | ICD-10-CM
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
## 2019-09-18 NOTE — Progress Notes (Signed)
Dear Dr. Ninfa Linden,   Thank you for referring [name] to our clinic. The following note includes my evaluation and treatment recommendations.  Chief Complaint:   OBESITY [name] (MR# [id number]) is a 59 y.o. female who presents for evaluation and treatment of obesity and related comorbidities. Current BMI is Body mass index is 50.26 kg/m. [name] has been struggling with her weight for many years and has been unsuccessful in either losing weight, maintaining weight loss, or reaching her healthy weight goal.  [name] is currently in the action stage of change and ready to dedicate time achieving and maintaining a healthier weight. [name] is interested in becoming our patient and working on intensive lifestyle modifications including (but not limited to) diet and exercise for weight loss.  [name] was referred by Dr. Ninfa Linden of Orthopedics as patient's BMI is too high for surgery, TKA, which patient needs.  Her PCP is Dr. Nolene Ebbs of [facility], but there are no records for my review on Epic.  He is part of [employer]  Clova is single and lives alone.  Jannel's habits were reviewed today and are as follows: Her family eats meals together, she thinks her family will eat healthier with her, her desired weight loss is 121 pounds, she has been heavy most of her life, she started gaining weight after having her children, her heaviest weight ever was 300 pounds, she is a picky eater and doesn't like to eat healthier foods, she craves fish and green beans, she skips breakfast frequently, she is frequently drinking liquids with calories and she struggles with emotional eating.  Depression Screen [name]'s Food and Mood (modified PHQ-9) score was 11.  Depression screen [facility] 2/9 09/18/2019  Decreased Interest 3  Down, Depressed, Hopeless 1  PHQ - 2 Score 4  Altered sleeping 1  Tired, decreased energy 2  Change in appetite 2  Feeling bad or failure about  yourself  0  Trouble concentrating 0  Moving slowly or fidgety/restless 2  Suicidal thoughts 0  PHQ-9 Score 11  Difficult doing work/chores Somewhat difficult   Subjective:   1. Other fatigue [name] admits to daytime somnolence and reports waking up still tired. Patent has a history of symptoms of daytime fatigue, morning fatigue, morning headache and snoring. Taziyah generally gets 6-8 hours of sleep per night, and states that she has generally restful sleep. Snoring is present. Apneic episodes are not present. Epworth Sleepiness Score is 6.  2. Shortness of breath on exertion Alante notes increasing shortness of breath with exercising and seems to be worsening over time with weight gain. She notes getting out of breath sooner with activity than she used to. This has gotten worse recently. Vernessa denies shortness of breath at rest or orthopnea.  3. Controlled type 2 diabetes mellitus with complication, with long-term current use of insulin (HCC) Medications reviewed. Diabetic ROS: no polyuria or polydipsia, no chest pain, dyspnea or TIA's, no numbness, tingling or pain in extremities.   Lab Results  Component Value Date   HGBA1C 11.8 07/22/2009   HGBA1C >14.0 04/09/2009   HGBA1C 12.3 12/27/2008   Lab Results  Component Value Date   LDLCALC 56 06/27/2008   CREATININE 0.91 03/02/2015   4. Hypertension associated with diabetes ([place]) Review: taking medications as instructed, no medication side effects noted, no chest pain on exertion, no dyspnea on exertion, no swelling of ankles.  She is taking metoprolol and quinapril-HCTZ daily.  BP Readings from Last 3  Encounters:  09/18/19 124/78  03/02/15 118/82  06/19/12 152/65   5. Hyperlipidemia associated with type 2 diabetes mellitus ([place]) Adaleigh has hyperlipidemia and has been trying to improve her cholesterol levels with intensive lifestyle modification including a low saturated fat diet, exercise and weight loss. She denies any  chest pain, claudication or myalgias.  She is taking Zocor 40 mg daily.  Lab Results  Component Value Date   ALT 20 08/01/2009   AST 15 08/01/2009   ALKPHOS 85 08/01/2009   BILITOT 0.3 08/01/2009   Lab Results  Component Value Date   CHOL 127 06/27/2008   HDL 48 06/27/2008   LDLCALC 56 06/27/2008   LDLDIRECT 66 04/09/2009   TRIG 115 06/27/2008   CHOLHDL 2.6 Ratio 06/27/2008   6. Osteoarthritis of both knees, unspecified osteoarthritis type Severe in both knees with chronic knee pain.  She has failed OP injections of steroids and hyaluronic acid analogues.    7. Magnesium deficiency [name] is taking a daily OTC magnesium supplement.   8. Vitamin D deficiency She is currently taking prescription vitamin D 50,000 IU each week. She denies nausea, vomiting or muscle weakness.  9. Other depression, with emotional eating [name] eats when she is bored, stressed, and saddened.  She has guilt over not eating healthy.  PHQ-9 is 11.  Assessment/Plan:   1. Other fatigue Makynna does feel that her weight is causing her energy to be lower than it should be. Fatigue may be related to obesity, depression or many other causes. Labs will be ordered, and in the meanwhile, Zahrah will focus on self care including making healthy food choices, increasing physical activity and focusing on stress reduction. - EKG 12-Lead - CBC with Differential/Platelet - Vitamin B12 - Folate - TSH - T3, free - T4, free  2. Shortness of breath on exertion [name] does feel that she gets out of breath more easily that she used to when she exercises. [name]'s shortness of breath appears to be obesity related and exercise induced. She has agreed to work on weight loss and gradually increase exercise to treat her exercise induced shortness of breath. Will continue to monitor closely.  3. Controlled type 2 diabetes mellitus with complication, with long-term current use of insulin (HCC) Good blood sugar control is  important to decrease the likelihood of diabetic complications such as nephropathy, neuropathy, limb loss, blindness, coronary artery disease, and death. Intensive lifestyle modification including diet, exercise and weight loss are the first line of treatment for diabetes.  Check labs, continue prudent nutritional plan, weight loss. - Hemoglobin A1c  4. Hypertension associated with diabetes ([place]) [name] is working on healthy weight loss and exercise to improve blood pressure control. We will watch for signs of hypotension as she continues her lifestyle modifications.  Check labs, continue prudent nutritional plan, weight loss. - Comprehensive metabolic panel  5. Hyperlipidemia associated with type 2 diabetes mellitus ([place]) Cardiovascular risk and specific lipid/LDL goals reviewed.  We discussed several lifestyle modifications today and [name] will continue to work on diet, exercise and weight loss efforts. Orders and follow up as documented in patient record.  Check labs, continue prudent nutritional plan, weight loss.  Counseling Intensive lifestyle modifications are the first line treatment for this issue. . Dietary changes: Increase soluble fiber. Decrease simple carbohydrates. . Exercise changes: Moderate to vigorous-intensity aerobic activity 150 minutes per week if tolerated. . Lipid-lowering medications: see documented in medical record. - Lipid panel  6. Osteoarthritis of both knees, unspecified osteoarthritis type Follow-up  with Orthopedics as directed.  Check labs, continue prudent nutritional plan, weight loss.  7. Magnesium deficiency Will check magnesium level today.  Continue prudent nutritional plan, and weight loss. - Magnesium  8. Vitamin D deficiency Low Vitamin D level contributes to fatigue and are associated with obesity, breast, and colon cancer. She agrees to continue to take prescription Vitamin D @50 ,000 IU every week and will follow-up for routine testing of  Vitamin D, at least 2-3 times per year to avoid over-replacement.  Check labs, continue prudent nutritional plan, weight loss. - VITAMIN D 25 Hydroxy (Vit-D Deficiency, Fractures)  9. Other depression, with emotional eating Patient was referred to Dr. Mallie Mussel, our Bariatric Psychologist, for evaluation due to her elevated PHQ-9 score of 11 and significant struggles with emotional eating.  Check labs, continue prudent nutritional plan, weight loss.  10. Class 3 severe obesity with serious comorbidity and body mass index (BMI) of 50.0 to 59.9 in adult, unspecified obesity type ([place]) [name] is currently in the action stage of change and her goal is to continue with weight loss efforts. I recommend [name] begin the structured treatment plan as follows:  She has agreed to the Category 2 Plan.  Exercise goals: As is.   Behavioral modification strategies: increasing lean protein intake, increasing water intake and planning for success.  She was informed of the importance of frequent follow-up visits to maximize her success with intensive lifestyle modifications for her multiple health conditions. She was informed we would discuss her lab results at her next visit unless there is a critical issue that needs to be addressed sooner. [name] agreed to keep her next visit at the agreed upon time to discuss these results.  Objective:   Blood pressure 124/78, pulse 65, temperature 98.6 F (37 C), temperature source Oral, height 5\' 1"  (1.549 m), weight 266 lb (120.7 kg), SpO2 98 %. Body mass index is 50.26 kg/m.  EKG: Normal sinus rhythm, rate 65 bpm.  Indirect Calorimeter completed today shows a VO2 of 273 and a REE of 1903.  Her calculated basal metabolic rate is 9622 thus her basal metabolic rate is better than expected.  General: Cooperative, alert, well developed, in no acute distress. HEENT: Conjunctivae and lids unremarkable. Cardiovascular: Regular rhythm.  Lungs: Normal work of  breathing. Neurologic: No focal deficits.   Lab Results  Component Value Date   CREATININE 0.91 03/02/2015   BUN 14 03/02/2015   NA 139 03/02/2015   K 3.7 03/02/2015   CL 102 03/02/2015   CO2 26 03/02/2015   Lab Results  Component Value Date   ALT 20 08/01/2009   AST 15 08/01/2009   ALKPHOS 85 08/01/2009   BILITOT 0.3 08/01/2009   Lab Results  Component Value Date   HGBA1C 11.8 07/22/2009   HGBA1C >14.0 04/09/2009   HGBA1C 12.3 12/27/2008   HGBA1C 8.4 09/27/2008   HGBA1C 8.6 06/27/2008   Lab Results  Component Value Date   TSH 0.929 04/09/2009   Lab Results  Component Value Date   CHOL 127 06/27/2008   HDL 48 06/27/2008   LDLCALC 56 06/27/2008   LDLDIRECT 66 04/09/2009   TRIG 115 06/27/2008   CHOLHDL 2.6 Ratio 06/27/2008   Lab Results  Component Value Date   WBC 6.7 03/02/2015   HGB 14.1 03/02/2015   HCT 45.1 03/02/2015   MCV 86.4 03/02/2015   PLT 323 03/02/2015   Obesity Behavioral Intervention Visit Documentation for Insurance:   Approximately 15 minutes were spent on the discussion below.  ASK: We discussed the diagnosis of obesity with Koda today and Parisa agreed to give [place] permission to discuss obesity behavioral modification therapy today.  ASSESS: Manna has the diagnosis of obesity and her BMI today is 50.3. Emaya is in the action stage of change.   ADVISE: Zriyah was educated on the multiple health risks of obesity as well as the benefit of weight loss to improve her health. She was advised of the need for long term treatment and the importance of lifestyle modifications to improve her current health and to decrease her risk of future health problems.  AGREE: Multiple dietary modification options and treatment options were discussed and Jeriyah agreed to follow the recommendations documented in the above note.  ARRANGE: Sinai was educated on the importance of frequent visits to treat obesity as outlined per CMS and USPSTF guidelines  and agreed to schedule her next follow up appointment today.  Attestation Statements:   Reviewed by clinician on day of visit: allergies, medications, problem list, medical history, surgical history, family history, social history, and previous encounter notes.  I, Water quality scientist, CMA, am acting as Location manager for [employer], DO.  I have reviewed the above documentation for accuracy and completeness, and I agree with the above. Mellody Dance, DO

## 2019-09-19 LAB — CBC WITH DIFFERENTIAL/PLATELET
Basophils Absolute: 0.1 10*3/uL (ref 0.0–0.2)
Basos: 1 %
EOS (ABSOLUTE): 0.1 10*3/uL (ref 0.0–0.4)
Eos: 2 %
Hematocrit: 47.5 % — ABNORMAL HIGH (ref 34.0–46.6)
Hemoglobin: 15.3 g/dL (ref 11.1–15.9)
Immature Grans (Abs): 0 10*3/uL (ref 0.0–0.1)
Immature Granulocytes: 0 %
Lymphocytes Absolute: 2.6 10*3/uL (ref 0.7–3.1)
Lymphs: 41 %
MCH: 27.4 pg (ref 26.6–33.0)
MCHC: 32.2 g/dL (ref 31.5–35.7)
MCV: 85 fL (ref 79–97)
Monocytes Absolute: 0.5 10*3/uL (ref 0.1–0.9)
Monocytes: 8 %
Neutrophils Absolute: 2.9 10*3/uL (ref 1.4–7.0)
Neutrophils: 48 %
Platelets: 312 10*3/uL (ref 150–450)
RBC: 5.58 x10E6/uL — ABNORMAL HIGH (ref 3.77–5.28)
RDW: 13.5 % (ref 11.7–15.4)
WBC: 6.2 10*3/uL (ref 3.4–10.8)

## 2019-09-19 LAB — COMPREHENSIVE METABOLIC PANEL
ALT: 11 IU/L (ref 0–32)
AST: 10 IU/L (ref 0–40)
Albumin/Globulin Ratio: 1.2 (ref 1.2–2.2)
Albumin: 4.3 g/dL (ref 3.8–4.8)
Alkaline Phosphatase: 85 IU/L (ref 48–121)
BUN/Creatinine Ratio: 28 (ref 12–28)
BUN: 24 mg/dL (ref 8–27)
Bilirubin Total: 0.5 mg/dL (ref 0.0–1.2)
CO2: 29 mmol/L (ref 20–29)
Calcium: 10 mg/dL (ref 8.7–10.3)
Chloride: 100 mmol/L (ref 96–106)
Creatinine, Ser: 0.87 mg/dL (ref 0.57–1.00)
GFR calc Af Amer: 83 mL/min/{1.73_m2} (ref >59)
GFR calc non Af Amer: 72 mL/min/{1.73_m2} (ref >59)
Globulin, Total: 3.6 g/dL (ref 1.5–4.5)
Glucose: 114 mg/dL — ABNORMAL HIGH (ref 65–99)
Potassium: 4.3 mmol/L (ref 3.5–5.2)
Sodium: 141 mmol/L (ref 134–144)
Total Protein: 7.9 g/dL (ref 6.0–8.5)

## 2019-09-19 LAB — VITAMIN D 25 HYDROXY (VIT D DEFICIENCY, FRACTURES): Vit D, 25-Hydroxy: 30.3 ng/mL (ref 30.0–100.0)

## 2019-09-19 LAB — MAGNESIUM: Magnesium: 2.3 mg/dL (ref 1.6–2.3)

## 2019-09-19 LAB — HEMOGLOBIN A1C
Est. average glucose Bld gHb Est-mCnc: 189 mg/dL
Hgb A1c MFr Bld: 8.2 % — ABNORMAL HIGH (ref 4.8–5.6)

## 2019-09-19 LAB — LIPID PANEL
Chol/HDL Ratio: 4 ratio (ref 0.0–4.4)
Cholesterol, Total: 166 mg/dL (ref 100–199)
HDL: 42 mg/dL (ref >39)
LDL Chol Calc (NIH): 99 mg/dL (ref 0–99)
Triglycerides: 138 mg/dL (ref 0–149)
VLDL Cholesterol Cal: 25 mg/dL (ref 5–40)

## 2019-09-19 LAB — T4, FREE: Free T4: 1.13 ng/dL (ref 0.82–1.77)

## 2019-09-19 LAB — VITAMIN B12: Vitamin B-12: 382 pg/mL (ref 232–1245)

## 2019-09-19 LAB — FOLATE: Folate: 12.2 ng/mL (ref >3.0)

## 2019-09-19 LAB — TSH: TSH: 0.687 u[IU]/mL (ref 0.450–4.500)

## 2019-09-19 LAB — T3, FREE: T3, Free: 2.9 pg/mL (ref 2.0–4.4)

## 2019-09-25 ENCOUNTER — Ambulatory Visit (INDEPENDENT_AMBULATORY_CARE_PROVIDER_SITE_OTHER): Payer: Self-pay | Admitting: Family Medicine

## 2019-09-27 ENCOUNTER — Other Ambulatory Visit: Payer: Self-pay

## 2019-09-27 ENCOUNTER — Telehealth (INDEPENDENT_AMBULATORY_CARE_PROVIDER_SITE_OTHER): Payer: Medicare HMO | Admitting: Psychology

## 2019-09-27 NOTE — Progress Notes (Signed)
Office: [phone]  /  Fax: [phone]    Date: October 11, 2019   Appointment Start Time: 8:55am Duration: 28 minutes Provider: Glennie Isle, Psy.D. Type of Session: Intake for Individual Therapy  Location of Patient: Home Location of Provider: Provider's Home Type of Contact: Telepsychological Visit via MyChart Video Visit  Informed Consent: Prior to proceeding with today's appointment, two pieces of identifying information were obtained. In addition, [name]'s physical location at the time of this appointment was obtained as well a phone number she could be reached at in the event of technical difficulties. [name] and this provider participated in today's telepsychological service.   The provider's role was explained to [name]. The provider reviewed and discussed issues of confidentiality, privacy, and limits therein (e.g., reporting obligations). In addition to verbal informed consent, written informed consent for psychological services was obtained prior to the initial appointment. Since the clinic is not a 24/7 crisis center, mental health emergency resources were shared and this  provider explained MyChart, e-mail, voicemail, and/or other messaging systems should be utilized only for non-emergency reasons. This provider also explained that information obtained during appointments will be placed in [name]'s medical record and relevant information will be shared with other providers at [facility] for coordination of care. Moreover, [name] agreed information may be shared with other [facility] providers as needed for coordination of care. By signing the service agreement document, [name] provided written consent for coordination of care. Prior to initiating telepsychological services, [name] completed an informed consent document, which included the development of a safety plan (i.e., an emergency contact, nearest emergency room, and emergency  resources) in the event of an emergency/crisis. [name] expressed understanding of the rationale of the safety plan. [name] verbally acknowledged understanding she is ultimately responsible for understanding her insurance benefits for telepsychological and in-person services. This provider also reviewed confidentiality, as it relates to telepsychological services, as well as the rationale for telepsychological services (i.e., to reduce exposure risk to COVID-19). [name]  acknowledged understanding that appointments cannot be recorded without both party consent and she is aware she is responsible for securing confidentiality on her end of the session. [name] verbally consented to proceed.  Chief Complaint/HPI: [name] was referred by Dr. Mellody Dance due to other depression, with emotional eating. Per the note for the initial visit with Dr. Mellody Dance on September 18, 2019, "[name] eats when she is bored, stressed, and saddened.  She has guilt over not eating healthy.  PHQ-9 is 11." The note for the initial appointment with Dr. Mellody Dance indicated the following: "[name]'s habits were reviewed today and are as follows: Her family eats meals together, she thinks her family will eat healthier with her, her desired weight loss is 121 pounds, she has been heavy most of her life, she started gaining weight after having her children, her heaviest weight ever was 300 pounds, she is a picky eater and doesn't like to eat healthier foods, she craves fish and green beans, she skips breakfast frequently, she is frequently drinking liquids with calories and she struggles with emotional eating." [name]'s Food and Mood (modified PHQ-9) score on September 18, 2019 was 11.  During today's appointment, [name] was verbally administered a questionnaire assessing various behaviors related to emotional eating. [name] endorsed the following: experience food cravings on a regular basis and overeat when you are worried about  something. She described the frequency of eating secondary to worry as "few times a week." She shared, "I have  to always think about what I eat because I'm a diabetic." [name] acknowledged she often skips breakfast resulting in her eating a bigger lunch. Regarding her current structured meal plan, [name] stated, "I like it." In addition, [name] denied a history of binge eating. [name] denied a history of restricting food intake, purging and engagement in other compensatory strategies, and has never been diagnosed with an eating disorder. She also denied a history of treatment for emotional eating. Furthermore, [name] denied other problems of concern.    Mental Status Examination:  Appearance: well groomed and appropriate hygiene  Behavior: appropriate to circumstances Mood: euthymic Affect: mood congruent; tearful when sharing about her granddaughters  Speech: normal in rate, volume, and tone Eye Contact: appropriate Psychomotor Activity: appropriate Gait: unable to assess Thought Process: linear, logical, and goal directed  Thought Content/Perception: denies suicidal and homicidal ideation, plan, and intent and no hallucinations, delusions, bizarre thinking or behavior reported or observed Orientation: time, person, place, and purpose of appointment Memory/Concentration: memory, attention, language, and fund of knowledge intact  Insight/Judgment: fair  Family & Psychosocial History: [name] reported she is in a relationship and she has three adult children. She indicated she is currently not employed, adding, "I'm out for health reasons." Additionally, [name] shared her highest level of education obtained is a GED. Currently, [name]'s social support system consists of her son, daughter, and grandchildren. Moreover, [name] stated she resides alone.  Medical History:  Past Medical History:  Diagnosis Date  . Acid reflux   . Arthritis   . Back pain   . Depression   . Diabetes mellitus  without complication ([place])   . Edema, lower extremity   . Glaucoma   . High cholesterol   . Hypertension   . Joint pain   . Nerve damage   . Shortness of breath   . Vitamin D deficiency    Past Surgical History:  Procedure Laterality Date  . btl    . CHOLECYSTECTOMY  2001   Current Outpatient Medications on File Prior to Visit  Medication Sig Dispense Refill  . acetaminophen (TYLENOL) 325 MG tablet Take 325 mg by mouth 2 (two) times daily.      Marland Kitchen amitriptyline (ELAVIL) 100 MG tablet Take 100 mg by mouth at bedtime.      Marland Kitchen aspirin 81 MG EC tablet Take 81 mg by mouth daily.      . Calcium Carbonate-Vitamin D (CALTRATE 600+D) 600-400 MG-UNIT per tablet Take 1 tablet by mouth 2 (two) times daily.      . cyclobenzaprine (FLEXERIL) 10 MG tablet TAKE 1 TABLET BY MOUTH AT BEDTIME AS NEEDED BACK PAIN 30 tablet 1  . diphenoxylate-atropine (LOMOTIL) 2.5-0.025 MG per tablet Take 1 tablet by mouth 4 (four) times daily as needed for diarrhea or loose stools. 12 tablet 0  . fluticasone (FLONASE) 50 MCG/ACT nasal spray 1 spray by Nasal route 2 (two) times daily as needed. For allergy symptoms     . furosemide (LASIX) 20 MG tablet Take 20 mg by mouth daily. For fluid retention/swelling     . gabapentin (NEURONTIN) 100 MG capsule Take 100 mg by mouth 4 (four) times daily.      Marland Kitchen glipiZIDE (GLUCOTROL) 10 MG tablet Take 10 mg by mouth 2 (two) times daily.      Marland Kitchen glucose blood (PRODIGY TEST) test strip 1 each by Other route as needed. Use as instructed     . insulin glargine (LANTUS) 100 UNIT/ML injection Inject into the skin as directed.  48 units twice a day. Dispense QS for 3 month supply.     . Insulin Pen Needle (B-D ULTRAFINE III SHORT PEN) 31G X 8 MM MISC by Does not apply route. dispense enough for once daily dosing.     . Insulin Syringe-Needle U-100 (B-D INS SYR ULTRAFINE 1CC/30G) 30G X 1/2" 1 ML MISC by Does not apply route. dispense enough for once daily dosing.     . INVOKANA 300 MG TABS tablet      . Liraglutide (VICTOZA) 18 MG/3ML SOLN Inject into the skin as directed. Inject once daily. Dose is 1.2 daily.  dispense 3 month supply.     . loratadine (CLARITIN) 10 MG tablet Take 10 mg by mouth daily.      . meloxicam (MOBIC) 15 MG tablet TAKE ONE TABLET DAILY WITH FOOD FOR PAIN 30 tablet 1  . metFORMIN (GLUCOPHAGE) 500 MG tablet Take 500 mg by mouth 2 (two) times daily with meals.      . metoprolol (LOPRESSOR) 50 MG tablet Take 50 mg by mouth 2 (two) times daily.      . naproxen (NAPROSYN) 500 MG tablet Take 1 tablet (500 mg total) by mouth 2 (two) times daily. 30 tablet 0  . nitroGLYCERIN (NITROSTAT) 0.4 MG SL tablet Place 0.4 mg under the tongue every 5 (five) minutes as needed. Place 1 under tongue if chest pain.  If pain does NOT resolve in 5 min Call 911 and take 2nd.  Take a 3rd dose if pain continues.     Marland Kitchen omeprazole (PRILOSEC) 40 MG capsule Take 40 mg by mouth daily.    Marland Kitchen OZEMPIC, 0.25 OR 0.5 MG/DOSE, 2 MG/1.5ML SOPN     . pantoprazole (PROTONIX) 20 MG tablet Take 2 tablets (40 mg total) by mouth daily. 20 tablet 0  . potassium chloride (KLOR-CON) 20 MEQ packet Take 20 mEq by mouth daily. With furosemide  (Patient not taking: Reported on 09/18/2019)    . quinapril-hydrochlorothiazide (ACCURETIC) 20-25 MG per tablet Take 1 tablet by mouth daily.      . simvastatin (ZOCOR) 40 MG tablet Take 40 mg by mouth at bedtime.      . Vitamin D, Ergocalciferol, (DRISDOL) 1.25 MG (50000 UNIT) CAPS capsule Take 1 capsule (50,000 Units total) by mouth 2 (two) times a week. 8 capsule 0   No current facility-administered medications on file prior to visit.   Mental Health History: [name] denied a history of therapeutic and psychiatric services. [name] reported there is no history of hospitalizations for psychiatric concerns. [name] endorsed a family history of mental health related concerns. She stated her maternal uncle, maternal cousins, and granddaughters have a history of mental health, but  she is unsure of diagnoses. [name] reported there is no history of trauma including psychological, physical  and sexual abuse, as well as neglect.   [name] described her typical mood lately as "good." She added, "I'm feeling good about myself. I'm trying to lose the weight." Aside from concerns noted above and endorsed on the PHQ-9 and GAD-7, [name] reported experiencing crying spells and worry thoughts about her granddaughters' physical well-being due to a recent diagnosis. [name] denied current alcohol use. She denied current tobacco use. She denied illicit/recreational substance use. Regarding caffeine intake, Nevaya reported consuming one cup of coffee daily. Furthermore, Wyolene indicated she is not experiencing the following: hallucinations and delusions, paranoia, symptoms of mania , social withdrawal, panic attacks and decreased motivation. She also denied history of and current suicidal ideation, plan, and  intent; history of and current homicidal ideation, plan, and intent; and history of and current engagement in self-harm.  The following strengths were reported by Samiyah: children, health, desire to keep going, staying physically active, and following what doctors tell her to do. The following strengths were observed by this provider: ability to express thoughts and feelings during the therapeutic session, ability to establish and benefit from a therapeutic relationship, willingness to work toward established goal(s) with the clinic and ability to engage in reciprocal conversation.   Legal History: Jashley reported there is no history of legal involvement.   Structured Assessments Results: The Patient Health Questionnaire-9 (PHQ-9) is a self-report measure that assesses symptoms and severity of depression over the course of the last two weeks. Sophiah obtained a score of 2 suggesting minimal depression. Darah finds the endorsed symptoms to be not difficult at all. [0= Not at all; 1=  Several days; 2= More than half the days; 3= Nearly every day] Little interest or pleasure in doing things 0  Feeling down, depressed, or hopeless 0  Trouble falling or staying asleep, or sleeping too much 1  Feeling tired or having little energy 0  Poor appetite or overeating 1  Feeling bad about yourself --- or that you are a failure or have let yourself or your family down 0  Trouble concentrating on things, such as reading the newspaper or watching television 0  Moving or speaking so slowly that other people could have noticed? Or the opposite --- being so fidgety or restless that you have been moving around a lot more than usual 0  Thoughts that you would be better off dead or hurting yourself in some way 0  PHQ-9 Score 2    The Generalized Anxiety Disorder-7 (GAD-7) is a brief self-report measure that assesses symptoms of anxiety over the course of the last two weeks. Kynleigh obtained a score of 8 suggesting mild anxiety. Nini finds the endorsed symptoms to be not difficult at all. [0= Not at all; 1= Several days; 2= Over half the days; 3= Nearly every day] Feeling nervous, anxious, on edge 0  Not being able to stop or control worrying 2  Worrying too much about different things 2  Trouble relaxing 2  Being so restless that it's hard to sit still 2  Becoming easily annoyed or irritable 0  Feeling afraid as if something awful might happen 0  GAD-7 Score 8   Interventions:  Conducted a chart review Focused on rapport building Verbally administered PHQ-9 and GAD-7 for symptom monitoring Verbally administered Food & Mood questionnaire to assess various behaviors related to emotional eating Provided emphatic reflections and validation Collaborated with patient on a treatment goal  Psychoeducation provided regarding physical versus emotional hunger  Provisional DSM-5 Diagnosis(es): 307.59 (F50.8) Other Specified Feeding or Eating Disorder, Emotional Eating Behaviors  Plan:  Kamisha appears able and willing to participate as evidenced by collaboration on a treatment goal, engagement in reciprocal conversation, and asking questions as needed for clarification. The next appointment will be scheduled in 2-3 weeks, which will be via MyChart Video Visit. The following treatment goal was established: increase coping skills. This provider will regularly review the treatment plan and medical chart to keep informed of status changes. Truc expressed understanding and agreement with the initial treatment plan of care. Zekiah will be sent a handout via e-mail to utilize between now and the next appointment to increase awareness of hunger patterns and subsequent eating. Jimi provided verbal consent during today's appointment  for this provider to send the handout via e-mail.

## 2019-10-02 ENCOUNTER — Ambulatory Visit (INDEPENDENT_AMBULATORY_CARE_PROVIDER_SITE_OTHER): Payer: Medicare HMO | Admitting: Family Medicine

## 2019-10-02 ENCOUNTER — Encounter: Payer: Self-pay | Admitting: Gastroenterology

## 2019-10-02 ENCOUNTER — Other Ambulatory Visit: Payer: Self-pay

## 2019-10-02 ENCOUNTER — Encounter (INDEPENDENT_AMBULATORY_CARE_PROVIDER_SITE_OTHER): Payer: Self-pay | Admitting: Family Medicine

## 2019-10-02 VITALS — BP 109/71 | HR 60 | Temp 98.1°F | Ht 61.0 in | Wt 268.0 lb

## 2019-10-02 DIAGNOSIS — Z9189 Other specified personal risk factors, not elsewhere classified: Secondary | ICD-10-CM | POA: Diagnosis not present

## 2019-10-02 DIAGNOSIS — I10 Essential (primary) hypertension: Secondary | ICD-10-CM

## 2019-10-02 DIAGNOSIS — E785 Hyperlipidemia, unspecified: Secondary | ICD-10-CM

## 2019-10-02 DIAGNOSIS — E559 Vitamin D deficiency, unspecified: Secondary | ICD-10-CM | POA: Diagnosis not present

## 2019-10-02 DIAGNOSIS — E1169 Type 2 diabetes mellitus with other specified complication: Secondary | ICD-10-CM | POA: Diagnosis not present

## 2019-10-02 DIAGNOSIS — I152 Hypertension secondary to endocrine disorders: Secondary | ICD-10-CM

## 2019-10-02 DIAGNOSIS — E1159 Type 2 diabetes mellitus with other circulatory complications: Secondary | ICD-10-CM

## 2019-10-02 DIAGNOSIS — Z6841 Body Mass Index (BMI) 40.0 and over, adult: Secondary | ICD-10-CM

## 2019-10-02 MED ORDER — VITAMIN D (ERGOCALCIFEROL) 1.25 MG (50000 UNIT) PO CAPS: 50000.0000 [IU] | ORAL_CAPSULE | ORAL | 0 refills | Status: DC

## 2019-10-03 NOTE — Progress Notes (Signed)
Chief Complaint:   OBESITY [name] is here to discuss her progress with her obesity treatment plan along with follow-up of her obesity related diagnoses. [name] is on the Category 2 Plan and states she is following her eating plan approximately 100% of the time. [name] states she is walking for 30 minutes 3 times per week.  Today's visit was #: 2 Starting weight: 10 Starting date: 09/18/2019 Today's weight: 268 lbs Today's date: 10/02/2019 Total lbs lost to date: 0 Total lbs lost since last in-office visit: 0  Interim History: [name] reports that she has skipped meals, usually one meal per ay.  She says her hunger is well-controlled, but her cravings are still there.  She craves cookies, [employer] Peanut Butter Cups ([employer] brand).  During the day, she drinks 4 bottles of water and coffee and tea in the morning.  She endorses dry mouth at night.  Subjective:   1. Type 2 diabetes mellitus with other specified complication, without long-term current use of insulin ([place]) Discussed labs with patient today.  Medications reviewed. Diabetic ROS: no polyuria or polydipsia, no chest pain, dyspnea or TIA's, no numbness, tingling or pain in extremities.  Fasting blood sugar ranges from 109-160, but is usually around 130s.  She denies lows.  A1c is not at goal at 8.2.  Lab Results  Component Value Date   HGBA1C 8.2 (H) 09/18/2019   HGBA1C 11.8 07/22/2009   HGBA1C >14.0 04/09/2009   Lab Results  Component Value Date   LDLCALC 99 09/18/2019   CREATININE 0.87 09/18/2019   2. Hypertension associated with diabetes ([place]) Discussed labs with patient today.  Review: taking medications as instructed, no medication side effects noted, no chest pain on exertion, no dyspnea on exertion, no swelling of ankles.  Blood pressure is well-controlled.  Denies symptoms or concerns.  She is tolerating medications well.  BP Readings from Last 3 Encounters:  10/02/19 109/71  09/18/19 124/78  03/02/15  118/82   3. Hyperlipidemia associated with type 2 diabetes mellitus ([place]) Discussed labs with patient today.  [name] has hyperlipidemia and has been trying to improve her cholesterol levels with intensive lifestyle modification including a low saturated fat diet, exercise and weight loss. She denies any chest pain, claudication or myalgias.  We discussed that her LDL goal is less than 70 with her being a diabetic.  Lab Results  Component Value Date   ALT 11 09/18/2019   AST 10 09/18/2019   ALKPHOS 85 09/18/2019   BILITOT 0.5 09/18/2019   Lab Results  Component Value Date   CHOL 166 09/18/2019   HDL 42 09/18/2019   LDLCALC 99 09/18/2019   LDLDIRECT 66 04/09/2009   TRIG 138 09/18/2019   CHOLHDL 4.0 09/18/2019   4. Vitamin D deficiency Discussed labs with patient today.  [name] Vitamin D level was 30.3 on 09/18/2019. She is currently taking prescription vitamin D 50,000 IU each week. She denies nausea, vomiting or muscle weakness.  She has been taking weekly vitamin D, per PCP.  She has been taking this for 6 months.  She says she takes it regularly.  5. At risk for dehydration [name] is at risk for dehydration due to little water intake with increased sodium level and BUN.  Assessment/Plan:   1. Type 2 diabetes mellitus with other specified complication, without long-term current use of insulin ([facility]) Good blood sugar control is important to decrease the likelihood of diabetic complications such as nephropathy, neuropathy, limb loss, blindness, coronary artery disease,  and death. Intensive lifestyle modification including diet, exercise and weight loss are the first line of treatment for diabetes.  Continue prudent nutritional plan, weight loss.  Follow-up with PCP/specialists for medication management.  2. Hypertension associated with diabetes ([place]) [name] is working on healthy weight loss and exercise to improve blood pressure control. We will watch for signs of hypotension as  she continues her lifestyle modifications.   3. Hyperlipidemia associated with type 2 diabetes mellitus ([place]) Cardiovascular risk and specific lipid/LDL goals reviewed.  We discussed several lifestyle modifications today and [name] will continue to work on diet, exercise and weight loss efforts. Orders and follow up as documented in patient record.  Continue prudent nutritional plan, weight loss.  She will follow-up with her PCP regarding possible change to statin dose.  Counseling Intensive lifestyle modifications are the first line treatment for this issue. . Dietary changes: Increase soluble fiber. Decrease simple carbohydrates. . Exercise changes: Moderate to vigorous-intensity aerobic activity 150 minutes per week if tolerated. . Lipid-lowering medications: see documented in medical record.  4. Vitamin D deficiency Low Vitamin D level contributes to fatigue and are associated with obesity, breast, and colon cancer. She agrees to change her vitamin D dose to @50 ,000 IU twice weekly and will follow-up for routine testing of Vitamin D, at least 2-3 times per year to avoid over-replacement.  Will recheck vitamin D and calcium levels in 2-3 months. - Vitamin D, Ergocalciferol, (DRISDOL) 1.25 MG (50000 UNIT) CAPS capsule; Take 1 capsule (50,000 Units total) by mouth 2 (two) times a week.  Dispense: 8 capsule; Refill: 0  5. At risk for dehydration [name] was given approximately 15 minutes dehydration prevention counseling today. [name] is at risk for dehydration due to weight loss and current medication(s). She was encouraged to hydrate and monitor fluid status to avoid dehydration as well as weight loss plateaus.   6. Class 3 severe obesity with serious comorbidity and body mass index (BMI) of 50.0 to 59.9 in adult, unspecified obesity type ([place]) [name] is currently in the action stage of change. As such, her goal is to continue with weight loss efforts. She has agreed to the Category 2 Plan.     Exercise goals: As is.  Behavioral modification strategies: increasing lean protein intake, increasing water intake, no skipping meals and planning for success.  [name] has agreed to follow-up with our clinic in 2 weeks. She was informed of the importance of frequent follow-up visits to maximize her success with intensive lifestyle modifications for her multiple health conditions.   Objective:   Blood pressure 109/71, pulse 60, temperature 98.1 F (36.7 C), temperature source Oral, height 5\' 1"  (1.549 m), weight 268 lb (121.6 kg), SpO2 96 %. Body mass index is 50.64 kg/m.  General: Cooperative, alert, well developed, in no acute distress. HEENT: Conjunctivae and lids unremarkable. Cardiovascular: Regular rhythm.  Lungs: Normal work of breathing. Neurologic: No focal deficits.   Lab Results  Component Value Date   CREATININE 0.87 09/18/2019   BUN 24 09/18/2019   NA 141 09/18/2019   K 4.3 09/18/2019   CL 100 09/18/2019   CO2 29 09/18/2019   Lab Results  Component Value Date   ALT 11 09/18/2019   AST 10 09/18/2019   ALKPHOS 85 09/18/2019   BILITOT 0.5 09/18/2019   Lab Results  Component Value Date   HGBA1C 8.2 (H) 09/18/2019   HGBA1C 11.8 07/22/2009   HGBA1C >14.0 04/09/2009   HGBA1C 12.3 12/27/2008   HGBA1C 8.4 09/27/2008  Lab Results  Component Value Date   TSH 0.687 09/18/2019   Lab Results  Component Value Date   CHOL 166 09/18/2019   HDL 42 09/18/2019   LDLCALC 99 09/18/2019   LDLDIRECT 66 04/09/2009   TRIG 138 09/18/2019   CHOLHDL 4.0 09/18/2019   Lab Results  Component Value Date   WBC 6.2 09/18/2019   HGB 15.3 09/18/2019   HCT 47.5 (H) 09/18/2019   MCV 85 09/18/2019   PLT 312 09/18/2019   Attestation Statements:   Reviewed by clinician on day of visit: allergies, medications, problem list, medical history, surgical history, family history, social history, and previous encounter notes.  I, Water quality scientist, CMA, am acting as Location manager  for [employer], DO.  I have reviewed the above documentation for accuracy and completeness, and I agree with the above. -  Mellody Dance, DO

## 2019-10-11 ENCOUNTER — Telehealth (INDEPENDENT_AMBULATORY_CARE_PROVIDER_SITE_OTHER): Payer: Medicare HMO | Admitting: Psychology

## 2019-10-11 ENCOUNTER — Other Ambulatory Visit: Payer: Self-pay

## 2019-10-11 DIAGNOSIS — F5089 Other specified eating disorder: Secondary | ICD-10-CM

## 2019-10-16 NOTE — Progress Notes (Signed)
  Office: [phone]  /  Fax: [phone]    Date: October 30, 2019   Appointment Start Time: 9:51am Duration: 24 minutes Provider: Glennie Isle, Psy.D. Type of Session: Individual Therapy  Location of Patient: Home Location of Provider: [facility] Type of Contact: Telepsychological Visit via MyChart Video Visit  Session Content: [name] is a 59 y.o. female presenting via MyChart Video Visit for a follow-up appointment to address the previously established treatment goal of increasing coping skills. Today's appointment was a telepsychological visit due to COVID-19. [name] provided verbal consent for today's telepsychological appointment and she is aware she is responsible for securing confidentiality on her end of the session. Prior to proceeding with today's appointment, [name]'s physical location at the time of this appointment was obtained as well a phone number she could be reached at in the event of technical difficulties. [name] and this provider participated in today's telepsychological service.   This provider conducted a brief check-in. [name] discussed an increase in physical activity, adding, "I've been doing okay." She further discussed an improvement in eating habits. Notably, [name] discussed feeling "stuck" as she wants to return to work; however, there is ongoing knee pain and she is unable to have surgery until she loses weight. Associated thoughts and feelings were briefly processed. Additionally, emotional and physical hunger were reviewed. Moreover, psychoeducation regarding triggers for emotional eating was provided. [name] was provided a handout, and encouraged to utilize the handout between now and the next appointment to increase awareness of triggers and frequency. [name] agreed. This provider also discussed behavioral strategies for specific triggers, such as placing the utensil down when conversing to avoid mindless eating. [name] provided  verbal consent during today's appointment for this provider to send a handout about triggers via e-mail. [name] was receptive to today's appointment as evidenced by openness to sharing, responsiveness to feedback, and willingness to explore triggers for emotional eating.  Mental Status Examination:  Appearance: well groomed and appropriate hygiene  Behavior: appropriate to circumstances Mood: sad Affect: mood congruent; tearful  Speech: normal in rate, volume, and tone Eye Contact: intermittent  Psychomotor Activity: appropriate Gait: unable to assess Thought Process: linear, logical, and goal directed  Thought Content/Perception: no hallucinations, delusions, bizarre thinking or behavior reported or observed and no evidence of suicidal and homicidal ideation, plan, and intent Orientation: time, person, place, and purpose of appointment Memory/Concentration: memory, attention, language, and fund of knowledge intact  Insight/Judgment: fair  Interventions:  Conducted a brief chart review Provided empathic reflections and validation Reviewed content from the previous session Employed supportive psychotherapy interventions to facilitate reduced distress and to improve coping skills with identified stressors Psychoeducation provided regarding triggers for emotional eating  DSM-5 Diagnosis(es): 307.59 (F50.8) Other Specified Feeding or Eating Disorder, Emotional Eating Behaviors  Treatment Goal & Progress: During the initial appointment with this provider, the following treatment goal was established: increase coping skills. [name] has demonstrated progress in her goal as evidenced by increased awareness of hunger patterns.   Plan: The next appointment will be scheduled in two weeks, which will be via MyChart Video Visit. The next session will focus on working towards the established treatment goal.

## 2019-10-17 ENCOUNTER — Encounter (INDEPENDENT_AMBULATORY_CARE_PROVIDER_SITE_OTHER): Payer: Self-pay | Admitting: Physician Assistant

## 2019-10-17 ENCOUNTER — Other Ambulatory Visit: Payer: Self-pay

## 2019-10-17 ENCOUNTER — Ambulatory Visit (INDEPENDENT_AMBULATORY_CARE_PROVIDER_SITE_OTHER): Payer: Medicare HMO | Admitting: Physician Assistant

## 2019-10-17 VITALS — BP 109/71 | HR 60 | Temp 98.1°F | Ht 61.0 in | Wt 268.0 lb

## 2019-10-17 DIAGNOSIS — Z6841 Body Mass Index (BMI) 40.0 and over, adult: Secondary | ICD-10-CM

## 2019-10-17 DIAGNOSIS — E559 Vitamin D deficiency, unspecified: Secondary | ICD-10-CM

## 2019-10-17 DIAGNOSIS — E118 Type 2 diabetes mellitus with unspecified complications: Secondary | ICD-10-CM

## 2019-10-18 NOTE — Progress Notes (Signed)
Chief Complaint:   OBESITY [name] is here to discuss her progress with her obesity treatment plan along with follow-up of her obesity related diagnoses. Muriah is on the Category 2 Plan and states she is following her eating plan approximately 70% of the time. Milla states she is walking 30 minutes 3 times per week.  Today's visit was #: 3 Starting weight: 266 lbs Starting date: 09/18/2019 Today's weight: 268 lbs Today's date: 10/17/2019 Total lbs lost to date: 0 Total lbs lost since last in-office visit: 0  Interim History: Alba is eating 2 eggs with cheese toast for breakfast, a chicken sandwich for lunch and drinking an Atkins drink. She is not hungry for breakfast or dinner. She is walking regularly with her granddaughter. She is also tracking her food on [employer] with the help of her granddaughter.  Subjective:   Diabetes mellitus type 2 with complications ([place]). Lezly's PCP prescribes insulin and Ozempic. She denies polyphagia.   Lab Results  Component Value Date   HGBA1C 8.2 (H) 09/18/2019   HGBA1C 11.8 07/22/2009   HGBA1C >14.0 04/09/2009   Lab Results  Component Value Date   LDLCALC 99 09/18/2019   CREATININE 0.87 09/18/2019   No results found for: INSULIN  Vitamin D deficiency. Imogean is on Vitamin D supplementation. No nausea, vomiting, or muscle weakness.    Ref. Range 09/18/2019 14:55  Vitamin D, 25-Hydroxy Latest Ref Range: 30.0 - 100.0 ng/mL 30.3   Assessment/Plan:   Diabetes mellitus type 2 with complications ([place]). Good blood sugar control is important to decrease the likelihood of diabetic complications such as nephropathy, neuropathy, limb loss, blindness, coronary artery disease, and death. Intensive lifestyle modification including diet, exercise and weight loss are the first line of treatment for diabetes. Harry will follow-up with her PCP and continues with medications as directed.  Vitamin D deficiency. Low Vitamin D  level contributes to fatigue and are associated with obesity, breast, and colon cancer. She agrees to continue to take Vitamin D as directed and will follow-up for routine testing of Vitamin D, at least 2-3 times per year to avoid over-replacement.  Class 3 severe obesity with serious comorbidity and body mass index (BMI) of 50.0 to 59.9 in adult, unspecified obesity type ([place]).  Ruben is currently in the action stage of change. As such, her goal is to continue with weight loss efforts. She has agreed to the Category 2 Plan and will journal 1200-1500 calories and 85 grams of protein daily.   Exercise goals: For substantial health benefits, adults should do at least 150 minutes (2 hours and 30 minutes) a week of moderate-intensity, or 75 minutes (1 hour and 15 minutes) a week of vigorous-intensity aerobic physical activity, or an equivalent combination of moderate- and vigorous-intensity aerobic activity. Aerobic activity should be performed in episodes of at least 10 minutes, and preferably, it should be spread throughout the week.  Behavioral modification strategies: increasing lean protein intake and no skipping meals.  Mychelle has agreed to follow-up with our clinic in 2 weeks. She was informed of the importance of frequent follow-up visits to maximize her success with intensive lifestyle modifications for her multiple health conditions.   Objective:   Blood pressure 109/71, pulse 60, temperature 98.1 F (36.7 C), temperature source Oral, height 5\' 1"  (1.549 m), weight (!) 268 lb (121.6 kg), SpO2 95 %. Body mass index is 50.64 kg/m.  General: Cooperative, alert, well developed, in no acute distress. HEENT: Conjunctivae and lids  unremarkable. Cardiovascular: Regular rhythm.  Lungs: Normal work of breathing. Neurologic: No focal deficits.   Lab Results  Component Value Date   CREATININE 0.87 09/18/2019   BUN 24 09/18/2019   NA 141 09/18/2019   K 4.3 09/18/2019   CL 100 09/18/2019    CO2 29 09/18/2019   Lab Results  Component Value Date   ALT 11 09/18/2019   AST 10 09/18/2019   ALKPHOS 85 09/18/2019   BILITOT 0.5 09/18/2019   Lab Results  Component Value Date   HGBA1C 8.2 (H) 09/18/2019   HGBA1C 11.8 07/22/2009   HGBA1C >14.0 04/09/2009   HGBA1C 12.3 12/27/2008   HGBA1C 8.4 09/27/2008   No results found for: INSULIN Lab Results  Component Value Date   TSH 0.687 09/18/2019   Lab Results  Component Value Date   CHOL 166 09/18/2019   HDL 42 09/18/2019   LDLCALC 99 09/18/2019   LDLDIRECT 66 04/09/2009   TRIG 138 09/18/2019   CHOLHDL 4.0 09/18/2019   Lab Results  Component Value Date   WBC 6.2 09/18/2019   HGB 15.3 09/18/2019   HCT 47.5 (H) 09/18/2019   MCV 85 09/18/2019   PLT 312 09/18/2019   No results found for: IRON, TIBC, FERRITIN  Obesity Behavioral Intervention Documentation for Insurance:   Approximately 15 minutes were spent on the discussion below.  ASK: We discussed the diagnosis of obesity with Doneshia today and Maybelline agreed to give [place] permission to discuss obesity behavioral modification therapy today.  ASSESS: Reata has the diagnosis of obesity and her BMI today is 50.8. Tyneisha is in the action stage of change.   ADVISE: Renda was educated on the multiple health risks of obesity as well as the benefit of weight loss to improve her health. She was advised of the need for long term treatment and the importance of lifestyle modifications to improve her current health and to decrease her risk of future health problems.  AGREE: Multiple dietary modification options and treatment options were discussed and [name] agreed to follow the recommendations documented in the above note.  ARRANGE: Rosana was educated on the importance of frequent visits to treat obesity as outlined per CMS and USPSTF guidelines and agreed to schedule her next follow up appointment today.  Attestation Statements:   Reviewed by clinician on day of  visit: allergies, medications, problem list, medical history, surgical history, family history, social history, and previous encounter notes.  IMichaelene Song, am acting as transcriptionist for Abby Potash, PA-C   I have reviewed the above documentation for accuracy and completeness, and I agree with the above. Abby Potash, PA-C

## 2019-10-29 ENCOUNTER — Other Ambulatory Visit (INDEPENDENT_AMBULATORY_CARE_PROVIDER_SITE_OTHER): Payer: Self-pay | Admitting: Family Medicine

## 2019-10-29 DIAGNOSIS — E559 Vitamin D deficiency, unspecified: Secondary | ICD-10-CM

## 2019-10-30 ENCOUNTER — Other Ambulatory Visit: Payer: Self-pay

## 2019-10-30 ENCOUNTER — Telehealth (INDEPENDENT_AMBULATORY_CARE_PROVIDER_SITE_OTHER): Payer: Medicare HMO | Admitting: Psychology

## 2019-10-30 DIAGNOSIS — F5089 Other specified eating disorder: Secondary | ICD-10-CM | POA: Diagnosis not present

## 2019-10-31 ENCOUNTER — Other Ambulatory Visit: Payer: Self-pay

## 2019-10-31 ENCOUNTER — Ambulatory Visit (INDEPENDENT_AMBULATORY_CARE_PROVIDER_SITE_OTHER): Payer: Medicare HMO | Admitting: Physician Assistant

## 2019-10-31 ENCOUNTER — Encounter (INDEPENDENT_AMBULATORY_CARE_PROVIDER_SITE_OTHER): Payer: Self-pay | Admitting: Physician Assistant

## 2019-10-31 VITALS — BP 138/92 | HR 57 | Temp 98.3°F | Ht 61.0 in | Wt 272.0 lb

## 2019-10-31 DIAGNOSIS — E559 Vitamin D deficiency, unspecified: Secondary | ICD-10-CM | POA: Diagnosis not present

## 2019-10-31 DIAGNOSIS — Z794 Long term (current) use of insulin: Secondary | ICD-10-CM

## 2019-10-31 DIAGNOSIS — E118 Type 2 diabetes mellitus with unspecified complications: Secondary | ICD-10-CM | POA: Diagnosis not present

## 2019-10-31 DIAGNOSIS — Z6841 Body Mass Index (BMI) 40.0 and over, adult: Secondary | ICD-10-CM

## 2019-10-31 MED ORDER — VITAMIN D (ERGOCALCIFEROL) 1.25 MG (50000 UNIT) PO CAPS: 50000.0000 [IU] | ORAL_CAPSULE | ORAL | 0 refills | Status: DC

## 2019-11-05 NOTE — Progress Notes (Signed)
Chief Complaint:   OBESITY [name] is here to discuss her progress with her obesity treatment plan along with follow-up of her obesity related diagnoses. [name] is on the Category 2 Plan and states she is following her eating plan approximately 100% of the time. [name] states she is walking 30 minutes 7 times per week.  Today's visit was #: 4 Starting weight: 266 lbs Starting date: 09/18/2019 Today's weight: 272 lbs Today's date: 10/31/2019 Total lbs lost to date: 0 Total lbs lost since last in-office visit: 0  Interim History: [name] reports that she is eating very close to plan. After reviewing her food today, she is following her category plan well. She is walking often. She states she is not hungry.  Subjective:   Vitamin D deficiency. Rafeef is on prescription Vitamin D twice weekly. No nausea, vomiting, or muscle weakness.    Ref. Range 09/18/2019 14:55  Vitamin D, 25-Hydroxy Latest Ref Range: 30.0 - 100.0 ng/mL 30.3   Controlled type 2 diabetes mellitus with complication, with long-term current use of insulin ([place]). Fasting blood sugars are in the range of 80 and 135; after meals 120-155. [name] is on insulin, Ozempic, and metformin. No nausea, vomiting, or diarrhea. No hypoglycemia.  Lab Results  Component Value Date   HGBA1C 8.2 (H) 09/18/2019   HGBA1C 11.8 07/22/2009   HGBA1C >14.0 04/09/2009   Lab Results  Component Value Date   LDLCALC 99 09/18/2019   CREATININE 0.87 09/18/2019   No results found for: INSULIN  Assessment/Plan:   Vitamin D deficiency. Low Vitamin D level contributes to fatigue and are associated with obesity, breast, and colon cancer. She was given a refill on her Vitamin D, Ergocalciferol, (DRISDOL) 1.25 MG (50000 UNIT) CAPS capsule 2 times a week #10 with 0 refills and will follow-up for routine testing of Vitamin D, at least 2-3 times per year to avoid over-replacement.   Controlled type 2 diabetes mellitus with  complication, with long-term current use of insulin ([place]). Good blood sugar control is important to decrease the likelihood of diabetic complications such as nephropathy, neuropathy, limb loss, blindness, coronary artery disease, and death. Intensive lifestyle modification including diet, exercise and weight loss are the first line of treatment for diabetes. [name] will continue her medications as directed and follow-up with her PCP as scheduled.  Class 3 severe obesity with serious comorbidity and body mass index (BMI) of 50.0 to 59.9 in adult, unspecified obesity type ([place]).  [name] is currently in the action stage of change. As such, her goal is to continue with weight loss efforts. She has agreed to change plans and will now follow the Category 3 Plan.   Exercise goals: For substantial health benefits, adults should do at least 150 minutes (2 hours and 30 minutes) a week of moderate-intensity, or 75 minutes (1 hour and 15 minutes) a week of vigorous-intensity aerobic physical activity, or an equivalent combination of moderate- and vigorous-intensity aerobic activity. Aerobic activity should be performed in episodes of at least 10 minutes, and preferably, it should be spread throughout the week.  Behavioral modification strategies: increasing lean protein intake and no skipping meals.  [name] has agreed to follow-up with our clinic in 2 weeks. She was informed of the importance of frequent follow-up visits to maximize her success with intensive lifestyle modifications for her multiple health conditions.   Objective:   Blood pressure (!) 138/92, pulse (!) 57, temperature 98.3 F (36.8 C), temperature source Oral, height 5\' 1"  (1.549  m), weight 272 lb (123.4 kg), SpO2 99 %. Body mass index is 51.39 kg/m.  General: Cooperative, alert, well developed, in no acute distress. HEENT: Conjunctivae and lids unremarkable. Cardiovascular: Regular rhythm.  Lungs: Normal work of breathing. Neurologic:  No focal deficits.   Lab Results  Component Value Date   CREATININE 0.87 09/18/2019   BUN 24 09/18/2019   NA 141 09/18/2019   K 4.3 09/18/2019   CL 100 09/18/2019   CO2 29 09/18/2019   Lab Results  Component Value Date   ALT 11 09/18/2019   AST 10 09/18/2019   ALKPHOS 85 09/18/2019   BILITOT 0.5 09/18/2019   Lab Results  Component Value Date   HGBA1C 8.2 (H) 09/18/2019   HGBA1C 11.8 07/22/2009   HGBA1C >14.0 04/09/2009   HGBA1C 12.3 12/27/2008   HGBA1C 8.4 09/27/2008   No results found for: INSULIN Lab Results  Component Value Date   TSH 0.687 09/18/2019   Lab Results  Component Value Date   CHOL 166 09/18/2019   HDL 42 09/18/2019   LDLCALC 99 09/18/2019   LDLDIRECT 66 04/09/2009   TRIG 138 09/18/2019   CHOLHDL 4.0 09/18/2019   Lab Results  Component Value Date   WBC 6.2 09/18/2019   HGB 15.3 09/18/2019   HCT 47.5 (H) 09/18/2019   MCV 85 09/18/2019   PLT 312 09/18/2019   No results found for: IRON, TIBC, FERRITIN  Obesity Behavioral Intervention Documentation for Insurance:   Approximately 15 minutes were spent on the discussion below.  ASK: We discussed the diagnosis of obesity with [name] today and [name] agreed to give [name] permission to discuss obesity behavioral modification therapy today.  ASSESS: [name] has the diagnosis of obesity and her BMI today is 51.4. [name] is in the action stage of change.   ADVISE: [name] was educated on the multiple health risks of obesity as well as the benefit of weight loss to improve her health. She was advised of the need for long term treatment and the importance of lifestyle modifications to improve her current health and to decrease her risk of future health problems.  AGREE: Multiple dietary modification options and treatment options were discussed and [name] agreed to follow the recommendations documented in the above note.  ARRANGE: [name] was educated on the importance of frequent visits to treat  obesity as outlined per CMS and USPSTF guidelines and agreed to schedule her next follow up appointment today.  Attestation Statements:   Reviewed by clinician on day of visit: allergies, medications, problem list, medical history, surgical history, family history, social history, and previous encounter notes.  IMichaelene Song, am acting as transcriptionist for Abby Potash, PA-C   I have reviewed the above documentation for accuracy and completeness, and I agree with the above. Abby Potash, PA-C

## 2019-11-07 ENCOUNTER — Telehealth: Payer: Self-pay

## 2019-11-07 NOTE — Telephone Encounter (Signed)
Good Day Dr. Loletha Carrow  Pt has a BMI of 51.42; Ht of 5'1.  She also is a DM on 5 meds which may or may not contribute to wt.  Would you like a direct to [facility] or for her to have an OV.  Thank you for your time.

## 2019-11-08 NOTE — Telephone Encounter (Signed)
Dr. Loletha Carrow,   I spoke with the patient today and she told me that she was going to get her BMI to 49 or below so that she could have her procedure at the [facility]. She states she is working hard with the weight loss clinic and feels like this is a good incentive to meet her goal. The patient verbalized understanding that if she was not able to meet this goal, she would call back to schedule at the hospital. Procedure and Pre- Visit were cancelled and patient will call to reschedule either at the hospital or [place]. Thanks!   Riki Sheer, LPN ( Pre-Visit )

## 2019-11-08 NOTE — Telephone Encounter (Signed)
She cannot have a procedure in the [place] because of her BMI.  Must go on waiting list for outpatient scopes at [facility] [address], which is currently about 3 months for routine procedures like screening colonoscopy (perhaps longer if COVID restrictions occur again).  I see that she is working with the weight loss clinic as well.  If she can get her BMI consistently 49 or below, then she should contact us and might be able to have her procedure in the [place].  - HD

## 2019-11-13 ENCOUNTER — Other Ambulatory Visit: Payer: Self-pay

## 2019-11-13 ENCOUNTER — Telehealth (INDEPENDENT_AMBULATORY_CARE_PROVIDER_SITE_OTHER): Payer: Medicare HMO | Admitting: Psychology

## 2019-11-13 DIAGNOSIS — F5089 Other specified eating disorder: Secondary | ICD-10-CM | POA: Diagnosis not present

## 2019-11-13 NOTE — Progress Notes (Signed)
Office: [phone]  /  Fax: [phone]    Date: November 13, 2019    Appointment Start Time: 10:04am  Duration: 21 minutes Provider: Glennie Isle, Psy.D. Type of Session: Individual Therapy  Location of Patient: Home Location of Provider: [facility] Type of Contact: Telepsychological Visit via MyChart Video Visit  Session Content: Kenleigh called this provider's clinic at 10am, noting she was waiting for a phone call to connect for today's appointment. Assistance was provided. As such, today's appointment was initiated 4 minutes late. Guila is a 59 y.o. female presenting for a follow-up appointment to address the previously established treatment goal of increasing coping skills. Today's appointment was a telepsychological visit due to COVID-19. Jeraldean provided verbal consent for today's telepsychological appointment and she is aware she is responsible for securing confidentiality on her end of the session. Prior to proceeding with today's appointment, Alicya's physical location at the time of this appointment was obtained as well a phone number she could be reached at in the event of technical difficulties. Kimm and this provider participated in today's telepsychological service.   This provider conducted a brief check-in. Kelani stated she is "trying to stick with" her meal plan, walking, and exercising. She added, "I'm not as tired as I used to be." Positive reinforcement was provided. Her eating habits were discussed. Brenae was reportedly given more food when she last saw Abby Potash, PA-C. She noted challenges eating everything. This was further reviewed and she was engaged in problem solving. Kalianne was receptive to eating smaller meals frequently throughout the day. Moreover, reviewed triggers for emotional eating. She described a reduction in emotional eating. Psychoeducation regarding pleasurable activities, including its impact on emotional eating and  overall well-being was provided. Kanisha was provided with a handout with various options of pleasurable activities, and was encouraged to engage in one activity a day and additional activities as needed when triggered to emotionally eat. Glema agreed. Caylyn provided verbal consent during today's appointment for this provider to send a handout with pleasurable activities via e-mail. Desma was receptive to today's appointment as evidenced by openness to sharing, responsiveness to feedback, and willingness to engage in pleasurable activities to assist with coping.  Mental Status Examination:  Appearance: well groomed and appropriate hygiene  Behavior: appropriate to circumstances Mood: euthymic Affect: mood congruent Speech: normal in rate, volume, and tone Eye Contact: intermittent  Psychomotor Activity: appropriate Gait: unable to assess Thought Process: linear, logical, and goal directed  Thought Content/Perception: no hallucinations, delusions, bizarre thinking or behavior reported or observed and no evidence of suicidal and homicidal ideation, plan, and intent Orientation: time, person, place, and purpose of appointment Memory/Concentration: memory, attention, language, and fund of knowledge intact  Insight/Judgment: good   Interventions:  Conducted a brief chart review Provided empathic reflections and validation Reviewed content from the previous session Provided positive reinforcement Employed supportive psychotherapy interventions to facilitate reduced distress and to improve coping skills with identified stressors Engaged patient in problem solving Psychoeducation provided regarding pleasurable activities  DSM-5 Diagnosis(es): 307.59 (F50.8) Other Specified Feeding or Eating Disorder, Emotional Eating Behaviors  Treatment Goal & Progress: During the initial appointment with this provider, the following treatment goal was established: increase coping skills. Ignacia has  demonstrated progress in her goal as evidenced by increased awareness of hunger patterns, increased awareness of triggers for emotional eating, and a reduction emotional eating. Mallery also demonstrates willingness to engage in pleasurable activities to assist with coping.  Plan: The next appointment will be  scheduled in three weeks, which will be via MyChart Video Visit. The next session will focus on working towards the established treatment goal.

## 2019-11-14 ENCOUNTER — Encounter (INDEPENDENT_AMBULATORY_CARE_PROVIDER_SITE_OTHER): Payer: Self-pay | Admitting: Physician Assistant

## 2019-11-14 ENCOUNTER — Other Ambulatory Visit: Payer: Self-pay

## 2019-11-14 ENCOUNTER — Ambulatory Visit (INDEPENDENT_AMBULATORY_CARE_PROVIDER_SITE_OTHER): Payer: Medicare HMO | Admitting: Physician Assistant

## 2019-11-14 VITALS — BP 118/77 | HR 61 | Temp 98.4°F | Ht 61.0 in | Wt 266.0 lb

## 2019-11-14 DIAGNOSIS — E1169 Type 2 diabetes mellitus with other specified complication: Secondary | ICD-10-CM | POA: Diagnosis not present

## 2019-11-14 DIAGNOSIS — E7849 Other hyperlipidemia: Secondary | ICD-10-CM | POA: Diagnosis not present

## 2019-11-14 DIAGNOSIS — Z6841 Body Mass Index (BMI) 40.0 and over, adult: Secondary | ICD-10-CM

## 2019-11-14 DIAGNOSIS — E785 Hyperlipidemia, unspecified: Secondary | ICD-10-CM

## 2019-11-14 NOTE — Progress Notes (Signed)
Chief Complaint:   OBESITY [name] is here to discuss her progress with her obesity treatment plan along with follow-up of her obesity related diagnoses. [name] is on the Category 3 Plan and states she is following her eating plan approximately 80% of the time. [name] states she is walking 30 minutes 4 times per week.  Today's visit was #: 5 Starting weight: 266 lbs Starting date: 09/18/2019 Today's weight: 266 lbs Today's date: 11/14/2019 Total lbs lost to date: 0 Total lbs lost since last in-office visit: 6  Interim History: [name] did a great job with weight loss over the last 2 weeks. She was able to change to the Category 3 plan and get all of the food in.  Subjective:   Hyperlipidemia associated with type 2 diabetes mellitus ([place]). Fasting blood sugars are in the range of 86 to 140. [name] is on glipizide, Lantus, metformin, and Ozempic. No hypoglycemia. Her PCP is managing her diabetes.  Lab Results  Component Value Date   HGBA1C 8.2 (H) 09/18/2019   HGBA1C 11.8 07/22/2009   HGBA1C >14.0 04/09/2009   Lab Results  Component Value Date   LDLCALC 99 09/18/2019   CREATININE 0.87 09/18/2019   No results found for: INSULIN  Other hyperlipidemia. [name] is on [name]. No chest pain or myalgias. She is exercising regularly.   Lab Results  Component Value Date   CHOL 166 09/18/2019   HDL 42 09/18/2019   LDLCALC 99 09/18/2019   LDLDIRECT 66 04/09/2009   TRIG 138 09/18/2019   CHOLHDL 4.0 09/18/2019   Lab Results  Component Value Date   ALT 11 09/18/2019   AST 10 09/18/2019   ALKPHOS 85 09/18/2019   BILITOT 0.5 09/18/2019   The 10-year ASCVD risk score Theresa Bussing DC Jr., et al., 2013) is: 12.9%   Values used to calculate the score:     Age: 59 years     Sex: Female     Is Non-Hispanic African American: Yes     Diabetic: Yes     Tobacco smoker: No     Systolic Blood Pressure: 431 mmHg     Is BP treated: Yes     HDL Cholesterol: 42 mg/dL     Total  Cholesterol: 166 mg/dL  Assessment/Plan:   Hyperlipidemia associated with type 2 diabetes mellitus ([place]). Good blood sugar control is important to decrease the likelihood of diabetic complications such as nephropathy, neuropathy, limb loss, blindness, coronary artery disease, and death. Intensive lifestyle modification including diet, exercise and weight loss are the first line of treatment for diabetes. [name] will continue her medications as directed and follow-up with her PCP as scheduled.  Other hyperlipidemia. Cardiovascular risk and specific lipid/LDL goals reviewed.  We discussed several lifestyle modifications today and [name] will continue to work on diet, exercise and weight loss efforts. Orders and follow up as documented in patient record. She will continue her medication as directed.   Counseling Intensive lifestyle modifications are the first line treatment for this issue. . Dietary changes: Increase soluble fiber. Decrease simple carbohydrates. . Exercise changes: Moderate to vigorous-intensity aerobic activity 150 minutes per week if tolerated. . Lipid-lowering medications: see documented in medical record.  Class 3 severe obesity with serious comorbidity and body mass index (BMI) of 50.0 to 59.9 in adult, unspecified obesity type ([name]).  [name] is currently in the action stage of change. As such, her goal is to continue with weight loss efforts. She has agreed to the Category 3 Plan.  Exercise goals: For substantial health benefits, adults should do at least 150 minutes (2 hours and 30 minutes) a week of moderate-intensity, or 75 minutes (1 hour and 15 minutes) a week of vigorous-intensity aerobic physical activity, or an equivalent combination of moderate- and vigorous-intensity aerobic activity. Aerobic activity should be performed in episodes of at least 10 minutes, and preferably, it should be spread throughout the week.  Behavioral modification strategies: meal planning  and cooking strategies and keeping healthy foods in the home.  [name] has agreed to follow-up with our clinic in 2-3 weeks. She was informed of the importance of frequent follow-up visits to maximize her success with intensive lifestyle modifications for her multiple health conditions.   Objective:   Blood pressure 118/77, pulse 61, temperature 98.4 F (36.9 C), temperature source Oral, height 5\' 1"  (1.549 m), weight 266 lb (120.7 kg), SpO2 98 %. Body mass index is 50.26 kg/m.  General: Cooperative, alert, well developed, in no acute distress. HEENT: Conjunctivae and lids unremarkable. Cardiovascular: Regular rhythm.  Lungs: Normal work of breathing. Neurologic: No focal deficits.   Lab Results  Component Value Date   CREATININE 0.87 09/18/2019   BUN 24 09/18/2019   NA 141 09/18/2019   K 4.3 09/18/2019   CL 100 09/18/2019   CO2 29 09/18/2019   Lab Results  Component Value Date   ALT 11 09/18/2019   AST 10 09/18/2019   ALKPHOS 85 09/18/2019   BILITOT 0.5 09/18/2019   Lab Results  Component Value Date   HGBA1C 8.2 (H) 09/18/2019   HGBA1C 11.8 07/22/2009   HGBA1C >14.0 04/09/2009   HGBA1C 12.3 12/27/2008   HGBA1C 8.4 09/27/2008   No results found for: INSULIN Lab Results  Component Value Date   TSH 0.687 09/18/2019   Lab Results  Component Value Date   CHOL 166 09/18/2019   HDL 42 09/18/2019   LDLCALC 99 09/18/2019   LDLDIRECT 66 04/09/2009   TRIG 138 09/18/2019   CHOLHDL 4.0 09/18/2019   Lab Results  Component Value Date   WBC 6.2 09/18/2019   HGB 15.3 09/18/2019   HCT 47.5 (H) 09/18/2019   MCV 85 09/18/2019   PLT 312 09/18/2019   No results found for: IRON, TIBC, FERRITIN  Obesity Behavioral Intervention Documentation for Insurance:   Approximately 15 minutes were spent on the discussion below.  ASK: We discussed the diagnosis of obesity with [name] today and [name] agreed to give [place] permission to discuss obesity behavioral modification therapy  today.  ASSESS: [name] has the diagnosis of obesity and her BMI today is 50.4. [name] is in the action stage of change.   ADVISE: [name] was educated on the multiple health risks of obesity as well as the benefit of weight loss to improve her health. She was advised of the need for long term treatment and the importance of lifestyle modifications to improve her current health and to decrease her risk of future health problems.  AGREE: Multiple dietary modification options and treatment options were discussed and [name] agreed to follow the recommendations documented in the above note.  ARRANGE: [name] was educated on the importance of frequent visits to treat obesity as outlined per CMS and USPSTF guidelines and agreed to schedule her next follow up appointment today.  Attestation Statements:   Reviewed by clinician on day of visit: allergies, medications, problem list, medical history, surgical history, family history, social history, and previous encounter notes.  [name], am acting as Location manager for [employer], PA-C  I have reviewed the above documentation for accuracy and completeness, and I agree with the above. Abby Potash, PA-C

## 2019-11-20 NOTE — Progress Notes (Unsigned)
Office: [phone]  /  Fax: [phone]    Date: December 04, 2019   Appointment Start Time: *** Duration: *** minutes Provider: Glennie Isle, Psy.D. Type of Session: Individual Therapy  Location of Patient: {gbptloc:23249} Location of Provider: Provider's Home Type of Contact: Telepsychological Visit via MyChart Video Visit  Session Content: [name] is a 59 y.o. female presenting for a follow-up appointment to address the previously established treatment goal of increasing coping skills. Today's appointment was a telepsychological visit due to COVID-19. [name] provided verbal consent for today's telepsychological appointment and she is aware she is responsible for securing confidentiality on her end of the session. Prior to proceeding with today's appointment, [name]'s physical location at the time of this appointment was obtained as well a phone number she could be reached at in the event of technical difficulties. [name] and this provider participated in today's telepsychological service.   This provider conducted a brief check-in and verbally administered the PHQ-9 and GAD-7. *** [name] was receptive to today's appointment as evidenced by openness to sharing, responsiveness to feedback, and {gbreceptiveness:23401}.  Mental Status Examination:  Appearance: {Appearance:22431} Behavior: {Behavior:22445} Mood: {gbmood:21757} Affect: {Affect:22436} Speech: {Speech:22432} Eye Contact: {Eye Contact:22433} Psychomotor Activity: {Motor Activity:22434} Gait: {gbgait:23404} Thought Process: {thought process:22448}  Thought Content/Perception: {disturbances:22451} Orientation: {Orientation:22437} Memory/Concentration: {gbcognition:22449} Insight/Judgment: {Insight:22446}  Structured Assessments Results: The Patient Health Questionnaire-9 (PHQ-9) is a self-report measure that assesses symptoms and severity of depression over the course of the last two weeks. [name] obtained a score  of *** suggesting {GBPHQ9SEVERITY:21752}. [name] finds the endorsed symptoms to be {gbphq9difficulty:21754}. [0= Not at all; 1= Several days; 2= More than half the days; 3= Nearly every day] Little interest or pleasure in doing things ***  Feeling down, depressed, or hopeless ***  Trouble falling or staying asleep, or sleeping too much ***  Feeling tired or having little energy ***  Poor appetite or overeating ***  Feeling bad about yourself --- or that you are a failure or have let yourself or your family down ***  Trouble concentrating on things, such as reading the newspaper or watching television ***  Moving or speaking so slowly that other people could have noticed? Or the opposite --- being so fidgety or restless that you have been moving around a lot more than usual ***  Thoughts that you would be better off dead or hurting yourself in some way ***  PHQ-9 Score ***    The Generalized Anxiety Disorder-7 (GAD-7) is a brief self-report measure that assesses symptoms of anxiety over the course of the last two weeks. [name] obtained a score of *** suggesting {gbgad7severity:21753}. [name] finds the endorsed symptoms to be {gbphq9difficulty:21754}. [0= Not at all; 1= Several days; 2= Over half the days; 3= Nearly every day] Feeling nervous, anxious, on edge ***  Not being able to stop or control worrying ***  Worrying too much about different things ***  Trouble relaxing ***  Being so restless that it's hard to sit still ***  Becoming easily annoyed or irritable ***  Feeling afraid as if something awful might happen ***  GAD-7 Score ***   Interventions:  {Interventions for Progress Notes:23405}  DSM-5 Diagnosis(es): 307.59 (F50.8) Other Specified Feeding or Eating Disorder, Emotional Eating Behaviors  Treatment Goal & Progress: During the initial appointment with this provider, the following treatment goal was established: increase coping skills. [name] has demonstrated progress in  her goal as evidenced by {gbtxprogress:22839}. [name] also {gbtxprogress2:22951}.  Plan: The next appointment will be scheduled in {gbweeks:21758}, which  will be {gbtxmodality:23402}. The next session will focus on {Plan for Next Appointment:23400}.

## 2019-11-28 ENCOUNTER — Other Ambulatory Visit (INDEPENDENT_AMBULATORY_CARE_PROVIDER_SITE_OTHER): Payer: Self-pay | Admitting: Physician Assistant

## 2019-11-28 DIAGNOSIS — E559 Vitamin D deficiency, unspecified: Secondary | ICD-10-CM

## 2019-11-29 LAB — CBC AND DIFFERENTIAL
HCT: 47 — AB (ref 36–46)
Hemoglobin: 14.5 (ref 12.0–16.0)
Platelets: 349 (ref 150–399)
WBC: 6.5

## 2019-11-29 LAB — BASIC METABOLIC PANEL
Chloride: 98 — AB (ref 99–108)
Glucose: 108
Potassium: 4.2 (ref 3.4–5.3)

## 2019-11-29 LAB — LIPID PANEL
Cholesterol: 124 (ref 0–200)
HDL: 28 — AB (ref 35–70)
LDL Cholesterol: 75
Triglycerides: 130 (ref 40–160)

## 2019-11-29 LAB — COMPREHENSIVE METABOLIC PANEL: Calcium: 9.7 (ref 8.7–10.7)

## 2019-11-29 LAB — TSH: TSH: 1 (ref 0.41–5.90)

## 2019-11-29 LAB — HEPATIC FUNCTION PANEL
ALT: 12 (ref 7–35)
AST: 15 (ref 13–35)
Bilirubin, Total: 0.6

## 2019-11-29 LAB — VITAMIN D 25 HYDROXY (VIT D DEFICIENCY, FRACTURES): Vit D, 25-Hydroxy: 107

## 2019-11-30 ENCOUNTER — Other Ambulatory Visit: Payer: Self-pay | Admitting: Internal Medicine

## 2019-12-03 ENCOUNTER — Encounter: Payer: Medicare HMO | Admitting: Gastroenterology

## 2019-12-03 ENCOUNTER — Ambulatory Visit (INDEPENDENT_AMBULATORY_CARE_PROVIDER_SITE_OTHER): Payer: Medicare HMO | Admitting: Physician Assistant

## 2019-12-03 ENCOUNTER — Encounter (INDEPENDENT_AMBULATORY_CARE_PROVIDER_SITE_OTHER): Payer: Self-pay | Admitting: Physician Assistant

## 2019-12-03 ENCOUNTER — Other Ambulatory Visit: Payer: Self-pay

## 2019-12-03 VITALS — BP 102/68 | HR 66 | Temp 98.6°F | Ht 61.0 in | Wt 258.0 lb

## 2019-12-03 DIAGNOSIS — E1169 Type 2 diabetes mellitus with other specified complication: Secondary | ICD-10-CM | POA: Diagnosis not present

## 2019-12-03 DIAGNOSIS — E559 Vitamin D deficiency, unspecified: Secondary | ICD-10-CM | POA: Diagnosis not present

## 2019-12-03 DIAGNOSIS — E785 Hyperlipidemia, unspecified: Secondary | ICD-10-CM | POA: Diagnosis not present

## 2019-12-03 DIAGNOSIS — Z6841 Body Mass Index (BMI) 40.0 and over, adult: Secondary | ICD-10-CM

## 2019-12-04 ENCOUNTER — Encounter (INDEPENDENT_AMBULATORY_CARE_PROVIDER_SITE_OTHER): Payer: Self-pay

## 2019-12-04 ENCOUNTER — Telehealth (INDEPENDENT_AMBULATORY_CARE_PROVIDER_SITE_OTHER): Payer: Medicare HMO | Admitting: Psychology

## 2019-12-04 NOTE — Progress Notes (Signed)
Chief Complaint:   OBESITY [name] is here to discuss her progress with her obesity treatment plan along with follow-up of her obesity related diagnoses. [name] is on the Category 3 Plan and states she is following her eating plan approximately 100% of the time. [name] states she is walking for 30 minutes 7 times per week.  Today's visit was #: 6 Starting weight: 266 lbs Starting date: 09/18/2019 Today's weight: 258 lbs Today's date: 12/03/2019 Total lbs lost to date: 8 lbs Total lbs lost since last in-office visit: 8 lbs  Interim History: [name] reports that she recently was diagnosed with COVID.  She has fully recovered and is feeling good.  She is eating everything on her plan, whether she is hungry or not.  Subjective:   1. Vitamin D deficiency [name]'s Vitamin D level was 30.3 on 09/18/2019. She is currently taking prescription vitamin D 50,000 IU twice weekly. She denies nausea, vomiting or muscle weakness.  2. Type 2 diabetes mellitus with hyperlipidemia ([facility]) Last A1c was 8.2.  She is on Lantus, metformin, and glipizide.  Fasting blood sugar ranges 70-100.  She felt fine with blood sugar at 70.  Her PCP is managing her diabetes medications.   Lab Results  Component Value Date   HGBA1C 8.2 (H) 09/18/2019   HGBA1C 11.8 07/22/2009   HGBA1C >14.0 04/09/2009   Lab Results  Component Value Date   LDLCALC 99 09/18/2019   CREATININE 0.87 09/18/2019   Assessment/Plan:   1. Vitamin D deficiency Low Vitamin D level contributes to fatigue and are associated with obesity, breast, and colon cancer. She agrees to continue to take prescription Vitamin D @50 ,000 IU twice weekly and will follow-up for routine testing of Vitamin D, at least 2-3 times per year to avoid over-replacement.  2. Type 2 diabetes mellitus with hyperlipidemia ([facility]) Good blood sugar control is important to decrease the likelihood of diabetic complications such as nephropathy, neuropathy, limb loss,  blindness, coronary artery disease, and death. Intensive lifestyle modification including diet, exercise and weight loss are the first line of treatment for diabetes.  Continue medications and follow-up with PCP.  3. Class 3 severe obesity with serious comorbidity and body mass index (BMI) of 45.0 to 49.9 in adult, unspecified obesity type ([place]) [name] is currently in the action stage of change. As such, her goal is to continue with weight loss efforts. She has agreed to the Category 3 Plan.   Exercise goals: All adults should avoid inactivity. Some physical activity is better than none, and adults who participate in any amount of physical activity gain some health benefits.  Behavioral modification strategies: meal planning and cooking strategies and keeping healthy foods in the home.  [name] has agreed to follow-up with our clinic in 2 weeks. She was informed of the importance of frequent follow-up visits to maximize her success with intensive lifestyle modifications for her multiple health conditions.   Objective:   Blood pressure 102/68, pulse 66, temperature 98.6 F (37 C), temperature source Oral, height 5\' 1"  (1.549 m), weight 258 lb (117 kg), SpO2 100 %. Body mass index is 48.75 kg/m.  General: Cooperative, alert, well developed, in no acute distress. HEENT: Conjunctivae and lids unremarkable. Cardiovascular: Regular rhythm.  Lungs: Normal work of breathing. Neurologic: No focal deficits.   Lab Results  Component Value Date   CREATININE 0.87 09/18/2019   BUN 24 09/18/2019   NA 141 09/18/2019   K 4.3 09/18/2019   CL 100 09/18/2019   CO2  29 09/18/2019   Lab Results  Component Value Date   ALT 11 09/18/2019   AST 10 09/18/2019   ALKPHOS 85 09/18/2019   BILITOT 0.5 09/18/2019   Lab Results  Component Value Date   HGBA1C 8.2 (H) 09/18/2019   HGBA1C 11.8 07/22/2009   HGBA1C >14.0 04/09/2009   HGBA1C 12.3 12/27/2008   HGBA1C 8.4 09/27/2008   Lab Results  Component  Value Date   TSH 0.687 09/18/2019   Lab Results  Component Value Date   CHOL 166 09/18/2019   HDL 42 09/18/2019   LDLCALC 99 09/18/2019   LDLDIRECT 66 04/09/2009   TRIG 138 09/18/2019   CHOLHDL 4.0 09/18/2019   Lab Results  Component Value Date   WBC 6.2 09/18/2019   HGB 15.3 09/18/2019   HCT 47.5 (H) 09/18/2019   MCV 85 09/18/2019   PLT 312 09/18/2019   Obesity Behavioral Intervention:   Approximately 15 minutes were spent on the discussion below.  ASK: We discussed the diagnosis of obesity with [name] today and [name] agreed to give [place] permission to discuss obesity behavioral modification therapy today.  ASSESS: [name] has the diagnosis of obesity and her BMI today is 48.8. [name] is in the action stage of change.   ADVISE: [name] was educated on the multiple health risks of obesity as well as the benefit of weight loss to improve her health. She was advised of the need for long term treatment and the importance of lifestyle modifications to improve her current health and to decrease her risk of future health problems.  AGREE: Multiple dietary modification options and treatment options were discussed and [name] agreed to follow the recommendations documented in the above note.  ARRANGE: [name] was educated on the importance of frequent visits to treat obesity as outlined per CMS and USPSTF guidelines and agreed to schedule her next follow up appointment today.  Attestation Statements:   Reviewed by clinician on day of visit: allergies, medications, problem list, medical history, surgical history, family history, social history, and previous encounter notes.  I, Water quality scientist, CMA, am acting as transcriptionist for Abby Potash, PA-C  I have reviewed the above documentation for accuracy and completeness, and I agree with the above. Abby Potash, PA-C

## 2019-12-04 NOTE — Progress Notes (Signed)
  Office: [phone]  /  Fax: [phone]    Date: December 10, 2019   Appointment Start Time: 10:35am Duration: 25 minutes Provider: Glennie Isle, Psy.D. Type of Session: Individual Therapy  Location of Patient: Home Location of Provider: Provider's Home Type of Contact: Telepsychological Visit via MyChart Video Visit  Session Content: This provider called [name] at 10:32am as she did not present for the telepsychological appointment. She stated she would sign on. As such, today's appointment was initiated 5 minutes late. [name] is a 59 y.o. female presenting for a follow-up appointment to address the previously established treatment goal of increasing coping skills. Today's appointment was a telepsychological visit due to COVID-19. [name] provided verbal consent for today's telepsychological appointment and she is aware she is responsible for securing confidentiality on her end of the session. Prior to proceeding with today's appointment, [name]'s physical location at the time of this appointment was obtained as well a phone number she could be reached at in the event of technical difficulties. [name] and this provider participated in today's telepsychological service.   This provider conducted a brief check-in. [name] reported she was recently diagnosed with COVID-19, noting she is feeling better. She discussed the aforementioned impacted her eating habits. Despite experiencing a decreased appetite, she stated she is "trying to get all the food in." Additionally, [name] reported feeling "depressed" that she is unable to walk regularly like she was prior to getting sick. Associated thoughts and feelings were processed.  Reviewed pleasurable activities. [name] was encouraged to continue engaging in pleasurable activities to assist with coping. Moreover, psychoeducation regarding guided imagery was provided as she discussed engaging in meditation activities and expressed desire to try  to relax to assist with recovery. She was led through a beach guided imagery exercise and her experience was processed. [name] provided verbal consent during today's appointment for this provider to send the handout for today's exercise via e-mail. [name] was receptive to today's appointment as evidenced by openness to sharing, responsiveness to feedback, and willingness to continue engaging in learned skills.  Mental Status Examination:  Appearance: well groomed and appropriate hygiene  Behavior: appropriate to circumstances Mood: sad Affect: mood congruent Speech: normal in rate, volume, and tone Eye Contact: appropriate Psychomotor Activity: appropriate Gait: unable to assess Thought Process: linear, logical, and goal directed  Thought Content/Perception: no hallucinations, delusions, bizarre thinking or behavior reported or observed and denies suicidal and homicidal ideation, plan, and intent Orientation: time, person, place, and purpose of appointment Memory/Concentration: memory, attention, language, and fund of knowledge intact  Insight/Judgment: good  Interventions:  Conducted a brief chart review Provided empathic reflections and validation Reviewed content from the previous session Employed supportive psychotherapy interventions to facilitate reduced distress and to improve coping skills with identified stressors Psychoeducation provided regarding guided imagery Engaged patient in a guided imagery exercise  DSM-5 Diagnosis(es): 307.59 (F50.8) Other Specified Feeding or Eating Disorder, Emotional Eating Behaviors  Treatment Goal & Progress: During the initial appointment with this provider, the following treatment goal was established: increase coping skills. [name] has demonstrated progress in her goal as evidenced by increased awareness of hunger patterns and increased awareness of triggers for emotional eating. [name] also continues to demonstrate willingness to engage in  learned skill(s).  Plan: The next appointment will be scheduled in two weeks, which will be via MyChart Video Visit. The next session will focus on working towards the established treatment goal.

## 2019-12-07 ENCOUNTER — Encounter: Payer: Self-pay | Admitting: Gastroenterology

## 2019-12-10 ENCOUNTER — Telehealth (INDEPENDENT_AMBULATORY_CARE_PROVIDER_SITE_OTHER): Payer: Medicare HMO | Admitting: Psychology

## 2019-12-10 DIAGNOSIS — F5089 Other specified eating disorder: Secondary | ICD-10-CM | POA: Diagnosis not present

## 2019-12-10 NOTE — Progress Notes (Signed)
Office: [phone]  /  Fax: [phone]    Date: December 24, 2019   Appointment Start Time: 10:35am Duration: 25 minutes Provider: Glennie Isle, Psy.D. Type of Session: Individual Therapy  Location of Patient: Home Location of Provider: Provider's Home Type of Contact: Telepsychological Visit via MyChart Video Visit  Session Content: This provider called Laylah at 10:32am as she did not present for the telepsychological appointment. She indicated she was on the phone with her granddaughter, but noted she could join the appointment. As such, today's appointment was initiated 5 minutes late.  [name] is a 59 y.o. female presenting for a follow-up appointment to address the previously established treatment goal of increasing coping skills. Today's appointment was a telepsychological visit due to COVID-19. Shonia provided verbal consent for today's telepsychological appointment and she is aware she is responsible for securing confidentiality on her end of the session. Prior to proceeding with today's appointment, Vasti's physical location at the time of this appointment was obtained as well a phone number she could be reached at in the event of technical difficulties. Nadalie and this provider participated in today's telepsychological service.   Of note, today's appointment was switched to a regular telephone call at 10:49am with Jamilynn's verbal consent due to technical issues.   This provider conducted a brief check-in and verbally administered the PHQ-9 and GAD-7. Madaline reported experiencing shortness of breath at times, noting she informed Abby Potash, Hershal Coria on December 17, 2019. It was recommended she reach out to her PCP; she agreed to call after today's appointment with this provider. Additionally, this provider recommended longer-term therapeutic services due to ongoing stressors/challenges. Parnika provided verbal consent for this provider to place a referral to address ongoing  stressors. The phone number for the practice was also provided and she agreed to call to schedule later today. Additionally, Mckennah reported she gained six pounds, but believes it is water weight. She shared she continues to experience decreased appetite. Psychoeducation regarding SMART goals was provided and Rylah was engaged in goal setting. The following goal was established: [place] will eat dinner congruent to her meal plan at least 4 out of 7 days a week between now and the next appointment with this provider. Notably, this provider discussed her upcoming maternity leave toward the end of November. Jahara acknowledged understanding given the uncertain nature of the circumstances, this provider may be out of the office sooner. All questions/concerns were addressed. Jaidence denied any concerns. Overall, Bisma was receptive to today's appointment as evidenced by openness to sharing, responsiveness to feedback, and willingness to work toward the established SMART goal.  Mental Status Examination:  Appearance: well groomed and appropriate hygiene  Behavior: appropriate to circumstances Mood: sad Affect: mood congruent; tearful at times Speech: normal in rate, volume, and tone Eye Contact: appropriate Psychomotor Activity: appropriate Gait: unable to assess Thought Process: linear, logical, and goal directed  Thought Content/Perception: no hallucinations, delusions, bizarre thinking or behavior reported or observed and no evidence of suicidal and homicidal ideation, plan, and intent Orientation: time, person, place, and purpose of appointment Memory/Concentration: memory, attention, language, and fund of knowledge intact  Insight/Judgment: fair  Structured Assessments Results: The Patient Health Questionnaire-9 (PHQ-9) is a self-report measure that assesses symptoms and severity of depression over the course of the last two weeks. Olene obtained a score of 8 suggesting mild depression.  Shariah finds the endorsed symptoms to be somewhat difficult. [0= Not at all; 1= Several days; 2= More than half the days; 3= Nearly every day]  Little interest or pleasure in doing things 0  Feeling down, depressed, or hopeless 2  Trouble falling or staying asleep, or sleeping too much 3  Feeling tired or having little energy 0  Poor appetite or overeating 2  Feeling bad about yourself --- or that you are a failure or have let yourself or your family down 1  Trouble concentrating on things, such as reading the newspaper or watching television 0  Moving or speaking so slowly that other people could have noticed? Or the opposite --- being so fidgety or restless that you have been moving around a lot more than usual 0  Thoughts that you would be better off dead or hurting yourself in some way 0  PHQ-9 Score 8    The Generalized Anxiety Disorder-7 (GAD-7) is a brief self-report measure that assesses symptoms of anxiety over the course of the last two weeks. Gracie obtained a score of 9 suggesting mild anxiety. Eldene finds the endorsed symptoms to be somewhat difficult. [0= Not at all; 1= Several days; 2= Over half the days; 3= Nearly every day] Feeling nervous, anxious, on edge 0  Not being able to stop or control worrying 3  Worrying too much about different things - weight, children, and job 3  Trouble relaxing 2  Being so restless that it's hard to sit still 1  Becoming easily annoyed or irritable 0  Feeling afraid as if something awful might happen 0  GAD-7 Score 9   Interventions:  Conducted a brief chart review Verbally administered PHQ-9 and GAD-7 for symptom monitoring Provided empathic reflections and validation Employed supportive psychotherapy interventions to facilitate reduced distress and to improve coping skills with identified stressors Engaged patient in goal setting Recommended/discussed option for longer-term therapeutic services Psychoeducation provided regarding  SMART goals  DSM-5 Diagnosis(es): 307.59 (F50.8) Other Specified Feeding or Eating Disorder, Emotional Eating Behaviors and 311 (F32.9) Unspecified Depressive Disorder  Treatment Goal & Progress: During the initial appointment with this provider, the following treatment goal was established: increase coping skills. Marykathryn has demonstrated progress in her goal as evidenced by increased awareness of hunger patterns and increased awareness of triggers for emotional eating. Marybell also continues to demonstrate willingness to engage in learned skill(s).  Plan: The next appointment will be scheduled in two weeks, which will be via MyChart Video Visit. The next session will focus on working towards the established treatment goal.

## 2019-12-17 ENCOUNTER — Ambulatory Visit (INDEPENDENT_AMBULATORY_CARE_PROVIDER_SITE_OTHER): Payer: Medicare HMO | Admitting: Physician Assistant

## 2019-12-17 ENCOUNTER — Other Ambulatory Visit: Payer: Self-pay

## 2019-12-17 ENCOUNTER — Encounter (INDEPENDENT_AMBULATORY_CARE_PROVIDER_SITE_OTHER): Payer: Self-pay | Admitting: Physician Assistant

## 2019-12-17 VITALS — BP 125/71 | HR 58 | Temp 98.3°F | Ht 61.0 in | Wt 264.0 lb

## 2019-12-17 DIAGNOSIS — E559 Vitamin D deficiency, unspecified: Secondary | ICD-10-CM

## 2019-12-17 DIAGNOSIS — E118 Type 2 diabetes mellitus with unspecified complications: Secondary | ICD-10-CM

## 2019-12-17 DIAGNOSIS — Z794 Long term (current) use of insulin: Secondary | ICD-10-CM | POA: Diagnosis not present

## 2019-12-17 DIAGNOSIS — Z6841 Body Mass Index (BMI) 40.0 and over, adult: Secondary | ICD-10-CM | POA: Diagnosis not present

## 2019-12-17 MED ORDER — VITAMIN D (ERGOCALCIFEROL) 1.25 MG (50000 UNIT) PO CAPS: 50000.0000 [IU] | ORAL_CAPSULE | ORAL | 0 refills | Status: DC

## 2019-12-17 NOTE — Progress Notes (Signed)
Chief Complaint:   OBESITY [name] is here to discuss her progress with her obesity treatment plan along with follow-up of her obesity related diagnoses. [name] is on the Category 3 Plan and states she is following her eating plan approximately 0% of the time. [name] states she is doing 0 minutes 0 times per week.  Today's visit was #: 7 Starting weight: 266 lbs Starting date: 09/18/2019 Today's weight: 264 lbs Today's date: 12/17/2019 Total lbs lost to date: 2 Total lbs lost since last in-office visit: 0  Interim History: [name] continues to recover from [name]. Her endurance is down, especially with day to day activity. She is doing great with breakfast and lunch but deviating for dinner, last night indulging in with pasta and bread.  Subjective:   1. Controlled type 2 diabetes mellitus with complication, with long-term current use of insulin (HCC) [name]'s fasting BGs range between 82 and 140. Last A1c was 8.2. She is managed by her primary care physician.  2. Vitamin D deficiency [name] is on Vit D twice weekly, and she is tolerating it well.  Assessment/Plan:   1. Controlled type 2 diabetes mellitus with complication, with long-term current use of insulin (HCC) Good blood sugar control is important to decrease the likelihood of diabetic complications such as nephropathy, neuropathy, limb loss, blindness, coronary artery disease, and death. Intensive lifestyle modification including diet, exercise and weight loss are the first line of treatment for diabetes. [name] will continue her medications and follow up with her primary care physician next week.  2. Vitamin D deficiency Low Vitamin D level contributes to fatigue and are associated with obesity, breast, and colon cancer. We will refill prescription Vitamin D for 1 month. [name] will follow-up for routine testing of Vitamin D, at least 2-3 times per year to avoid over-replacement.  - Vitamin D, Ergocalciferol,  (DRISDOL) 1.25 MG (50000 UNIT) CAPS capsule; Take 1 capsule (50,000 Units total) by mouth 2 (two) times a week.  Dispense: 10 capsule; Refill: 0  3. Class 3 severe obesity with serious comorbidity and body mass index (BMI) of 50.0 to 59.9 in adult, unspecified obesity type ([name]) [name] is currently in the action stage of change. As such, her goal is to continue with weight loss efforts. She has agreed to the Category 3 Plan.   Exercise goals: No exercise has been prescribed at this time.  Behavioral modification strategies: increasing lean protein intake and decreasing simple carbohydrates.  [name] has agreed to follow-up with our clinic in 2 weeks. She was informed of the importance of frequent follow-up visits to maximize her success with intensive lifestyle modifications for her multiple health conditions.   Objective:   Blood pressure 125/71, pulse (!) 58, temperature 98.3 F (36.8 C), height 5\' 1"  (1.549 m), weight 264 lb (119.7 kg), SpO2 100 %. Body mass index is 49.88 kg/m.  General: Cooperative, alert, well developed, in no acute distress. HEENT: Conjunctivae and lids unremarkable. Cardiovascular: Regular rhythm.  Lungs: Normal work of breathing. Neurologic: No focal deficits.   Lab Results  Component Value Date   CREATININE 0.87 09/18/2019   BUN 24 09/18/2019   NA 141 09/18/2019   K 4.3 09/18/2019   CL 100 09/18/2019   CO2 29 09/18/2019   Lab Results  Component Value Date   ALT 11 09/18/2019   AST 10 09/18/2019   ALKPHOS 85 09/18/2019   BILITOT 0.5 09/18/2019   Lab Results  Component Value Date   HGBA1C 8.2 (H) 09/18/2019  HGBA1C 11.8 07/22/2009   HGBA1C >14.0 04/09/2009   HGBA1C 12.3 12/27/2008   HGBA1C 8.4 09/27/2008   No results found for: INSULIN Lab Results  Component Value Date   TSH 0.687 09/18/2019   Lab Results  Component Value Date   CHOL 166 09/18/2019   HDL 42 09/18/2019   LDLCALC 99 09/18/2019   LDLDIRECT 66 04/09/2009   TRIG 138  09/18/2019   CHOLHDL 4.0 09/18/2019   Lab Results  Component Value Date   WBC 6.2 09/18/2019   HGB 15.3 09/18/2019   HCT 47.5 (H) 09/18/2019   MCV 85 09/18/2019   PLT 312 09/18/2019   No results found for: IRON, TIBC, FERRITIN  Obesity Behavioral Intervention:   Approximately 15 minutes were spent on the discussion below.  ASK: We discussed the diagnosis of obesity with [name] today and [name] agreed to give [place] permission to discuss obesity behavioral modification therapy today.  ASSESS: [name] has the diagnosis of obesity and her BMI today is 49.91. [name] is in the action stage of change.   ADVISE: [name] was educated on the multiple health risks of obesity as well as the benefit of weight loss to improve her health. She was advised of the need for long term treatment and the importance of lifestyle modifications to improve her current health and to decrease her risk of future health problems.  AGREE: Multiple dietary modification options and treatment options were discussed and [name] agreed to follow the recommendations documented in the above note.  ARRANGE: [name] was educated on the importance of frequent visits to treat obesity as outlined per CMS and USPSTF guidelines and agreed to schedule her next follow up appointment today.  Attestation Statements:   Reviewed by clinician on day of visit: allergies, medications, problem list, medical history, surgical history, family history, social history, and previous encounter notes.   Wilhemena Durie, am acting as transcriptionist for [employer], PA-C.  I have reviewed the above documentation for accuracy and completeness, and I agree with the above. Abby Potash, PA-C

## 2019-12-24 ENCOUNTER — Telehealth (INDEPENDENT_AMBULATORY_CARE_PROVIDER_SITE_OTHER): Payer: Medicare HMO | Admitting: Psychology

## 2019-12-24 ENCOUNTER — Other Ambulatory Visit: Payer: Self-pay

## 2019-12-24 DIAGNOSIS — F5089 Other specified eating disorder: Secondary | ICD-10-CM

## 2019-12-24 DIAGNOSIS — F329 Major depressive disorder, single episode, unspecified: Secondary | ICD-10-CM

## 2019-12-24 DIAGNOSIS — F32A Depression, unspecified: Secondary | ICD-10-CM

## 2019-12-24 NOTE — Progress Notes (Signed)
Office: [phone]  /  Fax: [phone]    Date: January 07, 2020   Appointment Start Time: 10:29am Duration: 20 minutes Provider: Glennie Isle, Psy.D. Type of Session: Individual Therapy  Location of Patient: Home Location of Provider: Provider's Home Type of Contact: Telepsychological Visit via MyChart Video Visit  Session Content: [name] is a 59 y.o. female presenting for a follow-up appointment to address the previously established treatment goal of increasing coping skills. Today's appointment was a telepsychological visit due to COVID-19. [name] provided verbal consent for today's telepsychological appointment and she is aware she is responsible for securing confidentiality on her end of the session. Prior to proceeding with today's appointment, [name]'s physical location at the time of this appointment was obtained as well a phone number she could be reached at in the event of technical difficulties. [name] and this provider participated in today's telepsychological service.   This provider conducted a brief check-in. [name] shared about recent events, including attending church services online and increasing walking. Positive reinforcement was provided. She also discussed an increase in eating since she is able to taste better since testing positive for COVID-19. The previously established SMART goal for dinner was reviewed. She noted, "It went very well." [name] added there has been an improvement in sleep since she is eating congruent to her meal plan and exercising more. [name] of today's appointment focused on reflecting on what has helped her to date. She reported leaving the house and socializing has helped with coping and improvement in mood. Also, reviewed learned skills (e.g., pleasurable activities, SMART goals). [name] was receptive to today's appointment as evidenced by openness to sharing, responsiveness to feedback, and willingness to continue engaging in learned  skills.  Mental Status Examination:  Appearance: well groomed and appropriate hygiene  Behavior: appropriate to circumstances Mood: euthymic Affect: mood congruent Speech: normal in rate, volume, and tone Eye Contact: appropriate Psychomotor Activity: appropriate Gait: unable to assess Thought Process: linear, logical, and goal directed  Thought Content/Perception: no hallucinations, delusions, bizarre thinking or behavior reported or observed and no evidence of suicidal and homicidal ideation, plan, and intent Orientation: time, person, place, and purpose of appointment Memory/Concentration: memory, attention, language, and fund of knowledge intact  Insight/Judgment: fair  Interventions:  Conducted a brief chart review Provided empathic reflections and validation Reviewed content from the previous session Provided positive reinforcement Employed supportive psychotherapy interventions to facilitate reduced distress and to improve coping skills with identified stressors  Reviewed learned skills  DSM-5 Diagnosis(es): 307.59 (F50.8) Other Specified Feeding or Eating Disorder, Emotional Eating Behaviors and 311 (F32.9) Unspecified Depressive Disorder  Treatment Goal & Progress: During the initial appointment with this provider, the following treatment goal was established: increase coping skills. [name] has demonstrated progress in her goal as evidenced by increased awareness of hunger patterns and increased awareness of triggers for emotional eating. [name] also continues to demonstrate willingness to engage in learned skill(s).  Plan: [name] will be initiating therapeutic services with [name] on January 09, 2020. She described feeling excited about initiating services. As such, she declined future appointments with this provider at this time. She acknowledged understanding that she may request a follow-up appointment with this provider in the future as long as  she is still established with the clinic. Notably, this provider discussed her upcoming maternity leave toward the end of November. [name] acknowledged understanding given the uncertain nature of the circumstances, this provider may be out of the office sooner. All questions/concerns were addressed. [name] denied  any concerns. No further follow-up planned by this provider.

## 2019-12-27 ENCOUNTER — Encounter: Payer: Medicare HMO | Admitting: Gastroenterology

## 2019-12-31 ENCOUNTER — Ambulatory Visit (INDEPENDENT_AMBULATORY_CARE_PROVIDER_SITE_OTHER): Payer: Medicare HMO | Admitting: Physician Assistant

## 2020-01-01 ENCOUNTER — Encounter (INDEPENDENT_AMBULATORY_CARE_PROVIDER_SITE_OTHER): Payer: Self-pay | Admitting: Adult Health

## 2020-01-01 ENCOUNTER — Ambulatory Visit (INDEPENDENT_AMBULATORY_CARE_PROVIDER_SITE_OTHER): Payer: Medicare HMO | Admitting: Adult Health

## 2020-01-01 ENCOUNTER — Other Ambulatory Visit: Payer: Self-pay

## 2020-01-01 VITALS — BP 131/81 | HR 63 | Temp 98.6°F | Ht 61.0 in | Wt 267.0 lb

## 2020-01-01 DIAGNOSIS — E1159 Type 2 diabetes mellitus with other circulatory complications: Secondary | ICD-10-CM | POA: Diagnosis not present

## 2020-01-01 DIAGNOSIS — E1169 Type 2 diabetes mellitus with other specified complication: Secondary | ICD-10-CM

## 2020-01-01 DIAGNOSIS — I152 Hypertension secondary to endocrine disorders: Secondary | ICD-10-CM

## 2020-01-01 DIAGNOSIS — Z6841 Body Mass Index (BMI) 40.0 and over, adult: Secondary | ICD-10-CM

## 2020-01-02 ENCOUNTER — Telehealth: Payer: Self-pay | Admitting: *Deleted

## 2020-01-02 DIAGNOSIS — I152 Hypertension secondary to endocrine disorders: Secondary | ICD-10-CM | POA: Insufficient documentation

## 2020-01-02 DIAGNOSIS — E1159 Type 2 diabetes mellitus with other circulatory complications: Secondary | ICD-10-CM | POA: Insufficient documentation

## 2020-01-02 NOTE — Telephone Encounter (Signed)
Patient returned call, I explained to her what is going on and why procedures had to be cancelled. Advised that we forwarded this information to her PCP and advised that they will more than likely discuss the option of Cologuard, she states that she has done this in the past and would be ok with that. Advised that they will discuss this at her upcoming PCP appt. Advised patient that she has been added to the wait list for a hospital procedure with Dr. Loletha Carrow, she is aware that November is booked and he has no December days, so it will probably be 2022. Patient verbalized understanding and has no concerns at this time.

## 2020-01-02 NOTE — Telephone Encounter (Signed)
Dr. Loletha Carrow, This pt is coming in for a PV on 01-08-20- her colonoscopy is 01-22-20. Her BMI on 01-01-20 was 50.45.  How would you like to proceed?  Would you like her to be weighed at her PV and if her BMI is still greater than 50 would you like an OV or direct to the hospital?  Thank you, Cyril Mourning

## 2020-01-02 NOTE — Telephone Encounter (Signed)
Dr. Loletha Carrow,   Thank you for letting me know. We will discuss this with the patient.  [name]

## 2020-01-02 NOTE — Telephone Encounter (Signed)
Patient has been added to the hospital wait list. Pre-Visit and [facility] procedure cancelled.   Vivien Rota, just an [place]

## 2020-01-02 NOTE — Progress Notes (Signed)
Chief Complaint:   OBESITY [name] is here to discuss her progress with her obesity treatment plan along with follow-up of her obesity related diagnoses. [name] is on the Category 3 Plan and states she is following her eating plan approximately 100% of the time. [name] states she is exercising 0 minutes 0 times per week.  Today's visit was #: 8 Starting weight: 266 lbs Starting date: 09/18/2019 Today's weight: 267 lbs Today's date: 01/01/2020 Total lbs lost to date: 0 Total lbs lost since last in-office visit: 0  Interim History: [name] had COVID-19 approximately 4 weeks ago and has residual symptoms of bilateral hearing loss, altered taste, and intermittent shortness of breath. She was seen by her PCP yesterday who lavaged her ears bilaterally, which improved hearing for a few hours and then hearing decreased again. She denies trauma to her head or previous history of hearing loss. If hearing is not restored to baseline in 3 months, she was instructed to follow-up with her PCP. She states she has been snacking more during her recovery from COVID-19.  Subjective:   Type 2 diabetes mellitus with other specified complication, without long-term current use of insulin ([place]). 09/18/2019 blood glucose 114 with an A1c of 8.2. [name] is on metformin, Invokana, Ozempic, Lantus, and glipizide. She is managed by her PCP who did not change any of her prescription medications. She had A1c checked 01/01/2020, which was 6.6 - at goal!  Lab Results  Component Value Date   HGBA1C 8.2 (H) 09/18/2019   HGBA1C 11.8 07/22/2009   HGBA1C >14.0 04/09/2009   Lab Results  Component Value Date   LDLCALC 99 09/18/2019   CREATININE 0.87 09/18/2019   No results found for: INSULIN  Hypertension associated with diabetes ([place]). Blood pressure and heart rate are stable on today's office visit. [name] is on metoprolol tartrate 50 mg BID, Quinapril/HCTZ 20/25 mg daily, and furosemide 20 mg PRN  edema.  BP Readings from Last 3 Encounters:  01/01/20 131/81  12/17/19 125/71  12/03/19 102/68   Lab Results  Component Value Date   CREATININE 0.87 09/18/2019   CREATININE 0.91 03/02/2015   CREATININE 1.00 06/19/2012   Assessment/Plan:   Type 2 diabetes mellitus with other specified complication, without long-term current use of insulin ([place]). Good blood sugar control is important to decrease the likelihood of diabetic complications such as nephropathy, neuropathy, limb loss, blindness, coronary artery disease, and death. Intensive lifestyle modification including diet, exercise and weight loss are the first line of treatment for diabetes. [name] will continue her current anti-diabetic prescription medications as directed and will continue to follow the Category 3 meal plan.  Hypertension associated with diabetes ([place]). [name] is working on healthy weight loss and exercise to improve blood pressure control. We will watch for signs of hypotension as she continues her lifestyle modifications. She will continue her current anti-hypertensive therapy as directed.   Class 3 severe obesity with serious comorbidity and body mass index (BMI) of 50.0 to 59.9 in adult, unspecified obesity type ([place]).  [name] is currently in the action stage of change. As such, her goal is to continue with weight loss efforts. She has agreed to the Category 3 Plan.   Exercise goals: No exercise has been prescribed at this time.  Behavioral modification strategies: increasing lean protein intake, decreasing simple carbohydrates, no skipping meals, meal planning and cooking strategies and planning for success.  [name] has agreed to follow-up with our clinic in 2 weeks. She was informed of  the importance of frequent follow-up visits to maximize her success with intensive lifestyle modifications for her multiple health conditions.   Objective:   Blood pressure 131/81, pulse 63, temperature 98.6 F (37 C), height  5\' 1"  (1.549 m), weight 267 lb (121.1 kg), SpO2 99 %. Body mass index is 50.45 kg/m.  General: Cooperative, alert, well developed, in no acute distress. HEENT: Conjunctivae and lids unremarkable. Cardiovascular: Regular rhythm.  Lungs: Normal work of breathing. Neurologic: No focal deficits.   Lab Results  Component Value Date   CREATININE 0.87 09/18/2019   BUN 24 09/18/2019   NA 141 09/18/2019   K 4.3 09/18/2019   CL 100 09/18/2019   CO2 29 09/18/2019   Lab Results  Component Value Date   ALT 11 09/18/2019   AST 10 09/18/2019   ALKPHOS 85 09/18/2019   BILITOT 0.5 09/18/2019   Lab Results  Component Value Date   HGBA1C 8.2 (H) 09/18/2019   HGBA1C 11.8 07/22/2009   HGBA1C >14.0 04/09/2009   HGBA1C 12.3 12/27/2008   HGBA1C 8.4 09/27/2008   No results found for: INSULIN Lab Results  Component Value Date   TSH 0.687 09/18/2019   Lab Results  Component Value Date   CHOL 166 09/18/2019   HDL 42 09/18/2019   LDLCALC 99 09/18/2019   LDLDIRECT 66 04/09/2009   TRIG 138 09/18/2019   CHOLHDL 4.0 09/18/2019   Lab Results  Component Value Date   WBC 6.2 09/18/2019   HGB 15.3 09/18/2019   HCT 47.5 (H) 09/18/2019   MCV 85 09/18/2019   PLT 312 09/18/2019   No results found for: IRON, TIBC, FERRITIN  Attestation Statements:   Reviewed by clinician on day of visit: allergies, medications, problem list, medical history, surgical history, family history, social history, and previous encounter notes.  Time spent on visit including pre-visit chart review and post-visit charting and care was 28 minutes.   I, Michaelene Song, am acting as Location manager for [employer], NP-C   I have reviewed the above documentation for accuracy and completeness, and I agree with the above. -  Cace Osorto d. Rhyse Skowron, NP-C

## 2020-01-02 NOTE — Telephone Encounter (Signed)
noted 

## 2020-01-02 NOTE — Telephone Encounter (Signed)
[name],  She will not be able to have her colonoscopy in the [place]. Procedue and pre-visit must be canceled.  Patient should go on our wait list for hospital outpatient procedures (please coordinate with my CMA Sherren Mocha and nurse Florene Glen)  That wait list is currently several months for screening procedures.  I have copied this to patient's PCP in case they would like to have a discussion with patient about Cologuard instead.  - HD  _______________________________  [name] on your patient.  We currently have a long wait for hospital-based proecdures.  Consider a cologuard.  [name]

## 2020-01-02 NOTE — Telephone Encounter (Signed)
LMOM to call office back.  Vivien Rota or Simpson, Could you see Dr. Loletha Carrow' note about setting this pt up at the hospital (or on wait list)?  Thanks, [employer]

## 2020-01-07 ENCOUNTER — Telehealth (INDEPENDENT_AMBULATORY_CARE_PROVIDER_SITE_OTHER): Payer: Medicare HMO | Admitting: Psychology

## 2020-01-07 DIAGNOSIS — F32A Depression, unspecified: Secondary | ICD-10-CM

## 2020-01-07 DIAGNOSIS — F329 Major depressive disorder, single episode, unspecified: Secondary | ICD-10-CM

## 2020-01-07 DIAGNOSIS — F5089 Other specified eating disorder: Secondary | ICD-10-CM

## 2020-01-09 ENCOUNTER — Ambulatory Visit (INDEPENDENT_AMBULATORY_CARE_PROVIDER_SITE_OTHER): Payer: Medicare HMO | Admitting: Clinical

## 2020-01-09 ENCOUNTER — Other Ambulatory Visit: Payer: Self-pay

## 2020-01-09 DIAGNOSIS — F330 Major depressive disorder, recurrent, mild: Secondary | ICD-10-CM | POA: Diagnosis not present

## 2020-01-12 ENCOUNTER — Other Ambulatory Visit (INDEPENDENT_AMBULATORY_CARE_PROVIDER_SITE_OTHER): Payer: Self-pay | Admitting: Physician Assistant

## 2020-01-12 DIAGNOSIS — E559 Vitamin D deficiency, unspecified: Secondary | ICD-10-CM

## 2020-01-12 DIAGNOSIS — F330 Major depressive disorder, recurrent, mild: Secondary | ICD-10-CM | POA: Insufficient documentation

## 2020-01-12 NOTE — Progress Notes (Signed)
Comprehensive Clinical Assessment (CCA) Note  01/09/2020 [name] Fullam [id number]       Virtual Visit via Video Note  I connected with [name] on 01/09/2020 at  2:00 PM EDT by a video enabled telemedicine application and verified that I am speaking with the correct person using two identifiers.  Location: Patient: Home Provider: Office   I discussed the limitations of evaluation and management by telemedicine and the availability of in person appointments. The patient expressed understanding and agreed to proceed.   Follow Up Instructions: I discussed the assessment and treatment plan with the patient. The patient was provided an opportunity to ask questions and all were answered. The patient agreed with the plan and demonstrated an understanding of the instructions.   The patient was advised to call back or seek an in-person evaluation if the symptoms worsen or if the condition fails to improve as anticipated.  I provided 25 minutes of non-face-to-face time during this encounter.   Theresa Jubilee Devony Mcgrady, LCSW  Visit Diagnosis:      ICD-10-CM   1. Major depressive disorder, recurrent episode, mild (HCC)  F33.0       Client is a 59 year old female presenting to [facility] for outpatient behavioral health services. Client reported she is referred by her PCP at [facility] for a clinical assessment. Client reported she is presenting due to chief complaint of recurrent depression. Client stated, "I'm not working, I caught covid and that made me more depressed, I live alone and I'm getting older, so my doctor thinks I need to talk to somebody". Client reported having depressive symptoms since she was a teenager after having her first child at 61 years old. Client reported dropping out of school at a young age due to raising her children. Client reported she also had to help raise her siblings and was unable to have a life of her own until her 14's. Client reported  over the past year she has caught and is recovering from [place] and having lingering effects on her health e.g breathing. Client reported she current endorses symptoms of being tearful and feeling sad. Client reported no history of medication management for her symptoms and/ or hospitalizations.  Client was screened for the following SDOH:    Counselor from 01/09/2020 in [facility]  PHQ-9 Total Score 5     Treatment recommendations: Individual therapy. Client declined evaluation for medication management at this time.  Clinician provided information on format of appointment (virtual or face to face).   The client was advised to call back or seek an in-person evaluation if the symptoms worsen or if the condition fails to improve as anticipated before the next scheduled appointment. Client was in agreement with treatment recommendations. Client denies suicidal/ homicidal ideation, hallucinations and delusions.     CCA Biopsychosocial  Intake/Chief Complaint:  CCA Intake With Chief Complaint CCA Part Two Date: 01/09/20 CCA Part Two Time: 37 Chief Complaint/Presenting Problem: Client reported she is presenting due to symptoms of depression. Patient's Currently Reported Symptoms/Problems: [name] reported tearfulness and depressed mood. Type of Services Patient Feels Are Needed: Individual therapy  Mental Health Symptoms Depression:  Depression: Change in energy/activity, Tearfulness, Duration of symptoms greater than two weeks, Hopelessness  Mania:  Mania: None  Anxiety:   Anxiety: None  Psychosis:  Psychosis: None  Trauma:  Trauma: None  Obsessions:  Obsessions: None  Compulsions:  Compulsions: None  Inattention:  Inattention: None  Hyperactivity/Impulsivity:  Hyperactivity/Impulsivity: N/A  Oppositional/Defiant Behaviors:  Oppositional/Defiant Behaviors: None  Emotional Irregularity:  Emotional Irregularity: None  Other Mood/Personality Symptoms:       Mental Status Exam Appearance and self-care  Stature:  Stature: Average  Weight:  Weight: Average weight  Clothing:  Clothing: Casual  Grooming:  Grooming: Normal  Cosmetic use:  Cosmetic Use: Age appropriate  Posture/gait:  Posture/Gait: Normal  Motor activity:  Motor Activity: Not Remarkable  Sensorium  Attention:  Attention: Normal  Concentration:  Concentration: Normal  Orientation:  Orientation: X5  Recall/memory:  Recall/Memory: Normal  Affect and Mood  Affect:  Affect: Depressed  Mood:  Mood: Depressed  Relating  Eye contact:  Eye Contact: Normal  Facial expression:  Facial Expression: Depressed  Attitude toward examiner:  Attitude Toward Examiner: Cooperative  Thought and Language  Speech flow: Speech Flow: Clear and Coherent  Thought content:  Thought Content: Appropriate to Mood and Circumstances  Preoccupation:  Preoccupations: None  Hallucinations:  Hallucinations: None  Organization:     Transport planner of Knowledge:  Fund of Knowledge: Good  Intelligence:  Intelligence: Average  Abstraction:  Abstraction: Normal  Judgement:  Judgement: Good  Reality Testing:  Reality Testing: Adequate  Insight:  Insight: Good  Decision Making:  Decision Making: Normal  Social Functioning  Social Maturity:  Social Maturity: Responsible  Social Judgement:  Social Judgement: Normal  Stress  Stressors:  Stressors: Transitions  Coping Ability:  Coping Ability: Normal  Skill Deficits:  Skill Deficits: Activities of daily living  Supports:  Supports: Family     Religion: Religion/Spirituality Are You A Religious Person?: Yes  Leisure/Recreation: Leisure / Recreation Do You Have Hobbies?: Yes  Exercise/Diet: Exercise/Diet Do You Exercise?: Yes Do You Follow a Special Diet?: No Do You Have Any Trouble Sleeping?: No   CCA Employment/Education  Employment/Work Situation: Employment / Work Situation Employment situation: On disability Why is patient on  disability: starting this month What is the longest time patient has a held a job?: [employer] for 5 years until health declinded, working in the Public relations account executive  Education: Education Did Teacher, adult education From [employer]?: Yes (Client reported she has her GED.) Did You Have Any Difficulty At [employer]?: Yes (Client reported she had a child and had to quit school didn't enjoy any activities.)   CCA Family/Childhood History  Family and Relationship History: Family history Marital status: Single Does patient have children?: Yes How many children?: 3 How is patient's relationship with their children?: Good relationship with them. Client reported she has had depression since she was younger, had first baby in early teens (82 and 86) fathers weren't in children's life so it left everything on her and left her to get help from family but her family (mom and grandmother) got sick so everything was left on her.  Childhood History:  Childhood History By whom was/is the patient raised?: Mother, Grandparents Additional childhood history information: Client reported she is from [place] but grew up in [place]. Raised by her mother and grandmother, had a terrible time and we did what we could do. Does patient have siblings?: Yes Did patient suffer any verbal/emotional/physical/sexual abuse as a child?: No Did patient suffer from severe childhood neglect?: No Has patient ever been sexually abused/assaulted/raped as an adolescent or adult?: No Was the patient ever a victim of a crime or a disaster?: No Witnessed domestic violence?: No Has patient been affected by domestic violence as an adult?: No  Child/Adolescent Assessment:     CCA Substance Use  Alcohol/Drug  Use: Alcohol / Drug Use History of alcohol / drug use?: No history of alcohol / drug abuse                         ASAM's:  Six Dimensions of Multidimensional Assessment  Dimension 1:  Acute Intoxication and/or  Withdrawal Potential:      Dimension 2:  Biomedical Conditions and Complications:      Dimension 3:  Emotional, Behavioral, or Cognitive Conditions and Complications:     Dimension 4:  Readiness to Change:     Dimension 5:  Relapse, Continued use, or Continued Problem Potential:     Dimension 6:  Recovery/Living Environment:     ASAM Severity Score:    ASAM Recommended Level of Treatment:     Substance use Disorder (SUD)    Recommendations for Services/Supports/Treatments: Recommendations for Services/Supports/Treatments Recommendations For Services/Supports/Treatments: Individual Therapy  DSM5 Diagnoses: Patient Active Problem List   Diagnosis Date Noted  . Major depressive disorder, recurrent episode, mild ([place]) 01/12/2020  . Hypertension associated with diabetes ([place]) 01/02/2020  . BMI 50.0-59.9, adult ([place]) 05/21/2019  . Chronic pain of left knee 05/21/2019  . Chronic pain of right knee 05/21/2019  . Unilateral primary osteoarthritis, left knee 05/21/2019  . Unilateral primary osteoarthritis, right knee 05/21/2019  . A[place]ORDON 11/12/2009  . PERIPHERAL EDEMA 08/01/2009  . Pain in joint, lower leg 07/07/2009  . SINUS TACHYCARDIA 04/09/2009  . BACK PAIN, LUMBOSACRAL, CHRONIC 06/17/2008  . SHOULDER PAIN, RIGHT, CHRONIC 05/21/2008  . PERIPHERAL NEUROPATHY, MILD 03/05/2008  . 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  . Diabetes mellitus ([place]) 05/19/2006  . HYPERCHOLESTEROLEMIA 05/19/2006  . OBESITY, NOS 05/19/2006  . DEPRESSIVE DISORDER, NOS 05/19/2006  . HYPERTENSION, BENIGN SYSTEMIC 05/19/2006  . RHINITIS, ALLERGIC 05/19/2006  . GASTROESOPHAGEAL REFLUX, NO ESOPHAGITIS 05/19/2006    Patient Centered Plan: Patient is on the following Treatment Plan(s):  Depression   Referrals to Alternative Service(s): Referred to Alternative Service(s):   Place:   Date:   Time:    Referred to Alternative Service(s):   Place:   Date:   Time:    Referred to Alternative Service(s):   Place:    Date:   Time:    Referred to Alternative Service(s):   Place:   Date:   Time:     Bernestine Amass

## 2020-01-15 ENCOUNTER — Other Ambulatory Visit: Payer: Self-pay

## 2020-01-15 ENCOUNTER — Ambulatory Visit (INDEPENDENT_AMBULATORY_CARE_PROVIDER_SITE_OTHER): Payer: Medicare HMO | Admitting: Adult Health

## 2020-01-15 ENCOUNTER — Encounter (INDEPENDENT_AMBULATORY_CARE_PROVIDER_SITE_OTHER): Payer: Self-pay | Admitting: Adult Health

## 2020-01-15 VITALS — BP 129/77 | HR 71 | Temp 98.9°F | Ht 61.0 in | Wt 266.0 lb

## 2020-01-15 DIAGNOSIS — I152 Hypertension secondary to endocrine disorders: Secondary | ICD-10-CM

## 2020-01-15 DIAGNOSIS — Z0000 Encounter for general adult medical examination without abnormal findings: Secondary | ICD-10-CM

## 2020-01-15 DIAGNOSIS — Z6841 Body Mass Index (BMI) 40.0 and over, adult: Secondary | ICD-10-CM

## 2020-01-15 DIAGNOSIS — E1159 Type 2 diabetes mellitus with other circulatory complications: Secondary | ICD-10-CM

## 2020-01-15 DIAGNOSIS — E1169 Type 2 diabetes mellitus with other specified complication: Secondary | ICD-10-CM

## 2020-01-15 DIAGNOSIS — E559 Vitamin D deficiency, unspecified: Secondary | ICD-10-CM

## 2020-01-16 DIAGNOSIS — Z1211 Encounter for screening for malignant neoplasm of colon: Secondary | ICD-10-CM | POA: Insufficient documentation

## 2020-01-16 DIAGNOSIS — Z0000 Encounter for general adult medical examination without abnormal findings: Secondary | ICD-10-CM | POA: Insufficient documentation

## 2020-01-16 DIAGNOSIS — E559 Vitamin D deficiency, unspecified: Secondary | ICD-10-CM | POA: Insufficient documentation

## 2020-01-16 NOTE — Progress Notes (Signed)
Chief Complaint:   OBESITY [name] is here to discuss her progress with her obesity treatment plan along with follow-up of her obesity related diagnoses. [name] is on the Category 3 Plan. [name] states she is walking 30 minutes 3 times per week.  Today's visit was #: 9 Starting weight: 266 lbs Starting date: 09/18/2019 Today's weight: 266 lbs Today's date: 01/15/2020 Total lbs lost to date: 0 Total lbs lost since last in-office visit: 1  Interim History: [name] received [place] COVID-19 booster 01/03/2020 and denies vaccine side effects with booster. She is awaiting delivery of home Cologuard kit to update colon cancer screening. She continues to enjoy the foods and structure of the Category 3 meal plan. Her ultimate goal is to lose down to 255 lbs.  Subjective:   Vitamin D deficiency. Vitamin D level on 09/18/2019 was 30.3, below goal of 50. [name] has been on Ergocalciferol twice weekly.   Ref. Range 09/18/2019 14:55  Vitamin D, 25-Hydroxy Latest Ref Range: 30.0 - 100.0 ng/mL 30.3   Type 2 diabetes mellitus with other specified complication, without long-term current use of insulin ([place]). Ambulatory fasting blood glucose levels 110-120's. [name] denies episodes of hypoglycemia. She is on metformin 500 mg BID, Invokana 300 mg BID, glipizide 10 mg BID, and Lantus 48 units BID.   Lab Results  Component Value Date   HGBA1C 8.2 (H) 09/18/2019   HGBA1C 11.8 07/22/2009   HGBA1C >14.0 04/09/2009   Lab Results  Component Value Date   LDLCALC 99 09/18/2019   CREATININE 0.87 09/18/2019   No results found for: [place] maintenance. [name] completed Cologuard approximately 2 years ago and, per patient, results were normal. She was not required to have a follow-up colonoscopy. Dr. Erick Alley ordered repeat Cologuard and she is awaiting Cologuard home kit to arrive.  Hypertension associated with type 2 diabetes mellitus ([place]). Blood pressure and heart rate are  excellent on today's office visit. [name] is on multiple antihypertensives and denies acute cardiac symptoms.  BP Readings from Last 3 Encounters:  01/15/20 129/77  01/01/20 131/81  12/17/19 125/71   Lab Results  Component Value Date   CREATININE 0.87 09/18/2019   CREATININE 0.91 03/02/2015   CREATININE 1.00 06/19/2012   Assessment/Plan:   Vitamin D deficiency. Low Vitamin D level contributes to fatigue and are associated with obesity, breast, and colon cancer. We will hold off on refill until labs can be checked at her next office visit.  Type 2 diabetes mellitus with other specified complication, without long-term current use of insulin ([place]). Good blood sugar control is important to decrease the likelihood of diabetic complications such as nephropathy, neuropathy, limb loss, blindness, coronary artery disease, and death. Intensive lifestyle modification including diet, exercise and weight loss are the first line of treatment for diabetes. Labs will be checked at her next office visit.  Healthcare maintenance. [name] will complete Cologuard and will follow-up with Dr. Erick Alley as directed.   Hypertension associated with type 2 diabetes mellitus ([place]). [name] is working on healthy weight loss and exercise to improve blood pressure control. We will watch for signs of hypotension as she continues her lifestyle modifications. Labs will be checked at her next office visit.  Class 3 severe obesity with serious comorbidity and body mass index (BMI) of 50.0 to 59.9 in adult, unspecified obesity type ([place]).  [name] is currently in the action stage of change. As such, her goal is to continue with weight loss efforts. She has agreed  to the Category 3 Plan.   She will complete home Cologuard per Dr. Loletha Carrow of GI.  Fasting labs will be checked at her next office visit.  Exercise goals: [name] will continue walking 30 minutes 3 times per week for exercise.  Behavioral modification  strategies: increasing lean protein intake, meal planning and cooking strategies and planning for success.  [name] has agreed to follow-up with our clinic fasting in 2-3 weeks. She was informed of the importance of frequent follow-up visits to maximize her success with intensive lifestyle modifications for her multiple health conditions.   Objective:   Blood pressure 129/77, pulse 71, temperature 98.9 F (37.2 C), height 5' 1" (1.549 m), weight 266 lb (120.7 kg), SpO2 98 %. Body mass index is 50.26 kg/m.  General: Cooperative, alert, well developed, in no acute distress. HEENT: Conjunctivae and lids unremarkable. Cardiovascular: Regular rhythm.  Lungs: Normal work of breathing. Neurologic: No focal deficits.   Lab Results  Component Value Date   CREATININE 0.87 09/18/2019   BUN 24 09/18/2019   NA 141 09/18/2019   K 4.3 09/18/2019   CL 100 09/18/2019   CO2 29 09/18/2019   Lab Results  Component Value Date   ALT 11 09/18/2019   AST 10 09/18/2019   ALKPHOS 85 09/18/2019   BILITOT 0.5 09/18/2019   Lab Results  Component Value Date   HGBA1C 8.2 (H) 09/18/2019   HGBA1C 11.8 07/22/2009   HGBA1C >14.0 04/09/2009   HGBA1C 12.3 12/27/2008   HGBA1C 8.4 09/27/2008   No results found for: INSULIN Lab Results  Component Value Date   TSH 0.687 09/18/2019   Lab Results  Component Value Date   CHOL 166 09/18/2019   HDL 42 09/18/2019   LDLCALC 99 09/18/2019   LDLDIRECT 66 04/09/2009   TRIG 138 09/18/2019   CHOLHDL 4.0 09/18/2019   Lab Results  Component Value Date   WBC 6.2 09/18/2019   HGB 15.3 09/18/2019   HCT 47.5 (H) 09/18/2019   MCV 85 09/18/2019   PLT 312 09/18/2019   No results found for: IRON, TIBC, FERRITIN  Attestation Statements:   Reviewed by clinician on day of visit: allergies, medications, problem list, medical history, surgical history, family history, social history, and previous encounter notes.  Time spent on visit including pre-visit chart  review and post-visit charting and care was 27 minutes.   I, Michaelene Song, am acting as Location manager for [employer], NP-C   I have reviewed the above documentation for accuracy and completeness, and I agree with the above. -  Ramiro Pangilinan d. Hailley Byers, NP-C

## 2020-01-22 ENCOUNTER — Encounter: Payer: Medicare HMO | Admitting: Gastroenterology

## 2020-02-04 ENCOUNTER — Other Ambulatory Visit: Payer: Self-pay

## 2020-02-04 ENCOUNTER — Ambulatory Visit (INDEPENDENT_AMBULATORY_CARE_PROVIDER_SITE_OTHER): Payer: Medicare HMO | Admitting: Adult Health

## 2020-02-04 ENCOUNTER — Encounter (INDEPENDENT_AMBULATORY_CARE_PROVIDER_SITE_OTHER): Payer: Self-pay | Admitting: Adult Health

## 2020-02-04 VITALS — BP 105/65 | HR 65 | Temp 98.2°F | Ht 61.0 in | Wt 267.0 lb

## 2020-02-04 DIAGNOSIS — E559 Vitamin D deficiency, unspecified: Secondary | ICD-10-CM | POA: Diagnosis not present

## 2020-02-04 DIAGNOSIS — E785 Hyperlipidemia, unspecified: Secondary | ICD-10-CM | POA: Diagnosis not present

## 2020-02-04 DIAGNOSIS — E1169 Type 2 diabetes mellitus with other specified complication: Secondary | ICD-10-CM

## 2020-02-04 DIAGNOSIS — Z6841 Body Mass Index (BMI) 40.0 and over, adult: Secondary | ICD-10-CM

## 2020-02-04 MED ORDER — VITAMIN D (ERGOCALCIFEROL) 1.25 MG (50000 UNIT) PO CAPS: 50000.0000 [IU] | ORAL_CAPSULE | ORAL | 0 refills | Status: DC

## 2020-02-04 NOTE — Progress Notes (Signed)
Chief Complaint:   OBESITY [name] is here to discuss her progress with her obesity treatment plan along with follow-up of her obesity related diagnoses. [name] is on the Category 3 Plan and states she is following her eating plan approximately 90% of the time. [name] states she is walking 30 minutes 3 times per week.  Today's visit was #: 10 Starting weight: 266 lbs Starting date: 09/18/2019 Today's weight: 267 lbs Today's date: 02/04/2020 Total lbs lost to date: 0 Total lbs lost since last in-office visit: 0  Interim History: [name] reports exacerbation of chronic back pain the last 4 days, which she believes was brought upon by vigorous housework. Her family has been very supportive with meal planning/prepping and grocery shopping with foods on plan.  Subjective:   Vitamin D deficiency. Vitamin D level on 06/29/202 was 30.3, well below goal of 50. [name] is on Ergocalciferol. No nausea, vomiting, or muscle weakness.    Ref. Range 09/18/2019 14:55  Vitamin D, 25-Hydroxy Latest Ref Range: 30.0 - 100.0 ng/mL 30.3   Type 2 diabetes mellitus with other specified complication, without long-term current use of insulin ([place]). 09/18/2019 A1c 8.2, above goal of 7.  She is on Metformin 500mg  BID, Semaglutide 0.5mg  QD, Glipizide 10mg  BID, Lantus 48U BID, Invokana 300mg  QD.  Lab Results  Component Value Date   HGBA1C 8.2 (H) 09/18/2019   HGBA1C 11.8 07/22/2009   HGBA1C >14.0 04/09/2009   Lab Results  Component Value Date   LDLCALC 99 09/18/2019   CREATININE 0.87 09/18/2019   No results found for: INSULIN  Hyperlipidemia associated with type 2 diabetes mellitus ([place]). Lipid panel on 09/18/2019 showed LDL above goal of 70 (99). [name] is on simvastatin 40 mg daily and estimates to have been greater than 3 years.  Lab Results  Component Value Date   CHOL 166 09/18/2019   HDL 42 09/18/2019   LDLCALC 99 09/18/2019   LDLDIRECT 66 04/09/2009   TRIG 138 09/18/2019    CHOLHDL 4.0 09/18/2019   Lab Results  Component Value Date   ALT 11 09/18/2019   AST 10 09/18/2019   ALKPHOS 85 09/18/2019   BILITOT 0.5 09/18/2019   The 10-year ASCVD risk score [name], et al., 2013) is: 9.4%   Values used to calculate the score:     Age: 76 years     Sex: Female     Is Non-Hispanic African American: Yes     Diabetic: Yes     Tobacco smoker: No     Systolic Blood Pressure: 659 mmHg     Is BP treated: Yes     HDL Cholesterol: 42 mg/dL     Total Cholesterol: 166 mg/dL  Assessment/Plan:   Vitamin D deficiency. Low Vitamin D level contributes to fatigue and are associated with obesity, breast, and colon cancer. She was given a refill on her Vitamin D, Ergocalciferol, (DRISDOL) 1.25 MG (50000 UNIT) CAPS capsule every week #4 with 0 refills and VITAMIN D 25 Hydroxy (Vit-D Deficiency, Fractures) level will be checked today.   Type 2 diabetes mellitus with other specified complication, without long-term current use of insulin ([place]). Good blood sugar control is important to decrease the likelihood of diabetic complications such as nephropathy, neuropathy, limb loss, blindness, coronary artery disease, and death. Intensive lifestyle modification including diet, exercise and weight loss are the first line of treatment for diabetes. Labs will be checked today. [name] will continue simvastatin and continue regular walking.  Hyperlipidemia associated  with type 2 diabetes mellitus ([name]). Cardiovascular risk and specific lipid/LDL goals reviewed.  We discussed several lifestyle modifications today and [name] will continue to work on diet, exercise and weight loss efforts. Orders and follow up as documented in patient record. Labs will be checked today. If LDL is well above goal of 70, will discuss changing statin therapy at next OV.   Counseling Intensive lifestyle modifications are the first line treatment for this issue. . Dietary changes: Increase soluble fiber. Decrease  simple carbohydrates. . Exercise changes: Moderate to vigorous-intensity aerobic activity 150 minutes per week if tolerated. . Lipid-lowering medications: see documented in medical record.  Class 3 severe obesity with serious comorbidity and body mass index (BMI) of 50.0 to 59.9 in adult, unspecified obesity type ([place]).  [name] is currently in the action stage of change. As such, her goal is to continue with weight loss efforts. She has agreed to the Category 3 Plan.   Handouts were provided on Thanksgiving and Protein Equivalent Snacks.  Exercise goals: [name] will continue walking 30 minutes 3 times per week.  Behavioral modification strategies: increasing lean protein intake, meal planning and cooking strategies and planning for success.  [name] has agreed to follow-up with our clinic in 2 weeks. She was informed of the importance of frequent follow-up visits to maximize her success with intensive lifestyle modifications for her multiple health conditions.   [name] was informed we would discuss her lab results at her next visit unless there is a critical issue that needs to be addressed sooner. [name] agreed to keep her next visit at the agreed upon time to discuss these results.  Objective:   Blood pressure 105/65, pulse 65, temperature 98.2 F (36.8 C), height 5\' 1"  (1.549 m), weight 267 lb (121.1 kg), SpO2 98 %. Body mass index is 50.45 kg/m.  General: Cooperative, alert, well developed, in no acute distress. HEENT: Conjunctivae and lids unremarkable. Cardiovascular: Regular rhythm.  Lungs: Normal work of breathing. Neurologic: No focal deficits.   Lab Results  Component Value Date   CREATININE 0.87 09/18/2019   BUN 24 09/18/2019   NA 141 09/18/2019   K 4.3 09/18/2019   CL 100 09/18/2019   CO2 29 09/18/2019   Lab Results  Component Value Date   ALT 11 09/18/2019   AST 10 09/18/2019   ALKPHOS 85 09/18/2019   BILITOT 0.5 09/18/2019   Lab Results  Component  Value Date   HGBA1C 8.2 (H) 09/18/2019   HGBA1C 11.8 07/22/2009   HGBA1C >14.0 04/09/2009   HGBA1C 12.3 12/27/2008   HGBA1C 8.4 09/27/2008   No results found for: INSULIN Lab Results  Component Value Date   TSH 0.687 09/18/2019   Lab Results  Component Value Date   CHOL 166 09/18/2019   HDL 42 09/18/2019   LDLCALC 99 09/18/2019   LDLDIRECT 66 04/09/2009   TRIG 138 09/18/2019   CHOLHDL 4.0 09/18/2019   Lab Results  Component Value Date   WBC 6.2 09/18/2019   HGB 15.3 09/18/2019   HCT 47.5 (H) 09/18/2019   MCV 85 09/18/2019   PLT 312 09/18/2019   No results found for: IRON, TIBC, FERRITIN  Attestation Statements:   Reviewed by clinician on day of visit: allergies, medications, problem list, medical history, surgical history, family history, social history, and previous encounter notes.  I, Michaelene Song, am acting as Location manager for [employer], NP-C   I have reviewed the above documentation for accuracy and completeness, and I agree with the above. -  Chelly Dombeck d. Karyss Frese, NP-C

## 2020-02-05 LAB — COMPREHENSIVE METABOLIC PANEL
ALT: 11 IU/L (ref 0–32)
AST: 10 IU/L (ref 0–40)
Albumin/Globulin Ratio: 1.2 (ref 1.2–2.2)
Albumin: 4.3 g/dL (ref 3.8–4.8)
Alkaline Phosphatase: 81 IU/L (ref 44–121)
BUN/Creatinine Ratio: 24 (ref 12–28)
BUN: 23 mg/dL (ref 8–27)
Bilirubin Total: 0.4 mg/dL (ref 0.0–1.2)
CO2: 28 mmol/L (ref 20–29)
Calcium: 10 mg/dL (ref 8.7–10.3)
Chloride: 100 mmol/L (ref 96–106)
Creatinine, Ser: 0.95 mg/dL (ref 0.57–1.00)
GFR calc Af Amer: 75 mL/min/{1.73_m2} (ref >59)
GFR calc non Af Amer: 65 mL/min/{1.73_m2} (ref >59)
Globulin, Total: 3.6 g/dL (ref 1.5–4.5)
Glucose: 106 mg/dL — ABNORMAL HIGH (ref 65–99)
Potassium: 4.5 mmol/L (ref 3.5–5.2)
Sodium: 145 mmol/L — ABNORMAL HIGH (ref 134–144)
Total Protein: 7.9 g/dL (ref 6.0–8.5)

## 2020-02-05 LAB — HEMOGLOBIN A1C
Est. average glucose Bld gHb Est-mCnc: 169 mg/dL
Hgb A1c MFr Bld: 7.5 % — ABNORMAL HIGH (ref 4.8–5.6)

## 2020-02-05 LAB — LIPID PANEL
Chol/HDL Ratio: 3.6 ratio (ref 0.0–4.4)
Cholesterol, Total: 171 mg/dL (ref 100–199)
HDL: 47 mg/dL (ref >39)
LDL Chol Calc (NIH): 101 mg/dL — ABNORMAL HIGH (ref 0–99)
Triglycerides: 130 mg/dL (ref 0–149)
VLDL Cholesterol Cal: 23 mg/dL (ref 5–40)

## 2020-02-05 LAB — VITAMIN D 25 HYDROXY (VIT D DEFICIENCY, FRACTURES): Vit D, 25-Hydroxy: 49.3 ng/mL (ref 30.0–100.0)

## 2020-02-18 ENCOUNTER — Other Ambulatory Visit: Payer: Self-pay

## 2020-02-18 ENCOUNTER — Ambulatory Visit (INDEPENDENT_AMBULATORY_CARE_PROVIDER_SITE_OTHER): Payer: Medicare HMO | Admitting: Adult Health

## 2020-02-18 ENCOUNTER — Encounter (INDEPENDENT_AMBULATORY_CARE_PROVIDER_SITE_OTHER): Payer: Self-pay | Admitting: Adult Health

## 2020-02-18 VITALS — BP 115/70 | HR 60 | Temp 98.0°F | Ht 61.0 in | Wt 271.0 lb

## 2020-02-18 DIAGNOSIS — E559 Vitamin D deficiency, unspecified: Secondary | ICD-10-CM | POA: Diagnosis not present

## 2020-02-18 DIAGNOSIS — Z794 Long term (current) use of insulin: Secondary | ICD-10-CM

## 2020-02-18 DIAGNOSIS — I152 Hypertension secondary to endocrine disorders: Secondary | ICD-10-CM

## 2020-02-18 DIAGNOSIS — E118 Type 2 diabetes mellitus with unspecified complications: Secondary | ICD-10-CM

## 2020-02-18 DIAGNOSIS — E1159 Type 2 diabetes mellitus with other circulatory complications: Secondary | ICD-10-CM | POA: Diagnosis not present

## 2020-02-18 DIAGNOSIS — E785 Hyperlipidemia, unspecified: Secondary | ICD-10-CM

## 2020-02-18 DIAGNOSIS — Z6841 Body Mass Index (BMI) 40.0 and over, adult: Secondary | ICD-10-CM

## 2020-02-18 DIAGNOSIS — E1169 Type 2 diabetes mellitus with other specified complication: Secondary | ICD-10-CM | POA: Diagnosis not present

## 2020-02-18 MED ORDER — ATORVASTATIN CALCIUM 40 MG PO TABS: 40.0000 mg | ORAL_TABLET | Freq: Every day | ORAL | 0 refills | Status: DC

## 2020-02-18 NOTE — Progress Notes (Signed)
Chief Complaint:   OBESITY [name] is here to discuss her progress with her obesity treatment plan along with follow-up of her obesity related diagnoses. [name] is on the Category 3 Plan and states she is following her eating plan approximately 90% of the time. [name] states she is walking 30 minutes 7 times per week.  Today's visit was #: 11 Starting weight: 266 lbs Starting date: 09/18/2019 Today's weight: 271 lbs Today's date: 02/18/2020 Total lbs lost to date: 0 Total lbs lost since last in-office visit: 0  Interim History: [place] visited her children and her grandchildren over Thanksgiving. She avoided sweets during the holiday and focused on protein/vegetables with a small amount of dressing. She continues to walk at least 30 minutes daily - great!  Subjective:   Hyperlipidemia associated with type 2 diabetes mellitus ([place]). Lipid panel on 02/04/2020 showed LDL elevated at 101, above goal of 70. LDL goal is less than 70 for diabetes. [name] is on simvastatin 40 mg daily and has been for 2-3 years. Labs were discussed with the patient today. She did not discuss changing statin therapy with her PCP at her last OV with. Dr. Loni Muse.  Lab Results  Component Value Date   CHOL 171 02/04/2020   HDL 47 02/04/2020   LDLCALC 101 (H) 02/04/2020   LDLDIRECT 66 04/09/2009   TRIG 130 02/04/2020   CHOLHDL 3.6 02/04/2020   Lab Results  Component Value Date   ALT 11 02/04/2020   AST 10 02/04/2020   ALKPHOS 81 02/04/2020   BILITOT 0.4 02/04/2020   The 10-year ASCVD risk score [name], et al., 2013) is: 11.7%   Values used to calculate the score:     Age: 59 years     Sex: Female     Is Non-Hispanic African American: Yes     Diabetic: Yes     Tobacco smoker: No     Systolic Blood Pressure: 643 mmHg     Is BP treated: Yes     HDL Cholesterol: 47 mg/dL     Total Cholesterol: 171 mg/dL  Type 2 diabetes mellitus with complication, with long-term current use of  insulin ([place]). 02/04/2020 blood glucose and A1c levels were both improved, however, A1c remains above goal at 7.5. We reviewed her current anti-diabetic prescriptions at length. Labs were discussed with the patient today.   Lab Results  Component Value Date   HGBA1C 7.5 (H) 02/04/2020   HGBA1C 8.2 (H) 09/18/2019   HGBA1C 11.8 07/22/2009   Lab Results  Component Value Date   LDLCALC 101 (H) 02/04/2020   CREATININE 0.95 02/04/2020   No results found for: INSULIN   Vitamin D deficiency. Vitamin D level on 02/04/2020 was 49.3, just below goal of 50. [name] has a few capsules of Ergocalciferol remaining from her last refill.   Ref. Range 02/04/2020 08:43  Vitamin D, 25-Hydroxy Latest Ref Range: 30.0 - 100.0 ng/mL 49.3   Hypertension associated with type 2 diabetes mellitus ([place]). Blood pressure and heart rate are excellent on today's office visit. [name] is on metoprolol 50 mg BID, quinapril/HCTZ 20/25 mg daily, and furosemide 20 mg daily. CMP on 02/04/2020 was stable. Labs were discussed with the patient today.   BP Readings from Last 3 Encounters:  02/18/20 115/70  02/04/20 105/65  01/15/20 129/77   Lab Results  Component Value Date   CREATININE 0.95 02/04/2020   CREATININE 0.87 09/18/2019   CREATININE 0.91 03/02/2015   Assessment/Plan:   Hyperlipidemia  associated with type 2 diabetes mellitus ([place]). Cardiovascular risk and specific lipid/LDL goals reviewed.  We discussed several lifestyle modifications today and [name] will continue to work on diet, exercise and weight loss efforts. Orders and follow up as documented in patient record. [name] will stop simvastatin and start atorvastatin (LIPITOR) 40 MG tablet #30 with 0 refills.  Counseling Intensive lifestyle modifications are the first line treatment for this issue. . Dietary changes: Increase soluble fiber. Decrease simple carbohydrates. . Exercise changes: Moderate to vigorous-intensity aerobic activity 150 minutes per  week if tolerated. . Lipid-lowering medications: see documented in medical record.     Type 2 diabetes mellitus with complication, with long-term current use of insulin ([name]). Good blood sugar control is important to decrease the likelihood of diabetic complications such as nephropathy, neuropathy, limb loss, blindness, coronary artery disease, and death. Intensive lifestyle modification including diet, exercise and weight loss are the first line of treatment for diabetes. We recommended discussing with PCP increasing Ozempic and decreasing her Lantus dose.  Vitamin D deficiency. Low Vitamin D level contributes to fatigue and are associated with obesity, breast, and colon cancer. She will complete Ergocalciferol and then convert to OTC Vitamin D3 5,000 IU daily. She will follow-up for routine testing of Vitamin D, at least 2-3 times per year to avoid over-replacement.  Hypertension associated with type 2 diabetes mellitus ([name]). [name] is working on healthy weight loss and exercise to improve blood pressure control. We will watch for signs of hypotension as she continues her lifestyle modifications. She will continue her current antihypertensive therapy as directed and continue regular walking.  Class 3 severe obesity with serious comorbidity and body mass index (BMI) of 50.0 to 59.9 in adult, unspecified obesity type ([name]).  [name] is currently in the action stage of change. As such, her goal is to continue with weight loss efforts. She has agreed to the Category 3 Plan.   Exercise goals: [name] will continue walking 30 minutes 7 times per week.  Behavioral modification strategies: increasing lean protein intake, no skipping meals, meal planning and cooking strategies and planning for success.  [name] has agreed to follow-up with our clinic in 2-3 weeks. She was informed of the importance of frequent follow-up visits to maximize her success with intensive lifestyle modifications for her  multiple health conditions.   Objective:   Blood pressure 115/70, pulse 60, temperature 98 F (36.7 C), height 5\' 1"  (1.549 m), weight 271 lb (122.9 kg), SpO2 98 %. Body mass index is 51.21 kg/m.  General: Cooperative, alert, well developed, in no acute distress. HEENT: Conjunctivae and lids unremarkable. Cardiovascular: Regular rhythm.  Lungs: Normal work of breathing. Neurologic: No focal deficits.   Lab Results  Component Value Date   CREATININE 0.95 02/04/2020   BUN 23 02/04/2020   NA 145 (H) 02/04/2020   K 4.5 02/04/2020   CL 100 02/04/2020   CO2 28 02/04/2020   Lab Results  Component Value Date   ALT 11 02/04/2020   AST 10 02/04/2020   ALKPHOS 81 02/04/2020   BILITOT 0.4 02/04/2020   Lab Results  Component Value Date   HGBA1C 7.5 (H) 02/04/2020   HGBA1C 8.2 (H) 09/18/2019   HGBA1C 11.8 07/22/2009   HGBA1C >14.0 04/09/2009   HGBA1C 12.3 12/27/2008   No results found for: INSULIN Lab Results  Component Value Date   TSH 0.687 09/18/2019   Lab Results  Component Value Date   CHOL 171 02/04/2020   HDL 47 02/04/2020   [place]  101 (H) 02/04/2020   LDLDIRECT 66 04/09/2009   TRIG 130 02/04/2020   CHOLHDL 3.6 02/04/2020   Lab Results  Component Value Date   WBC 6.2 09/18/2019   HGB 15.3 09/18/2019   HCT 47.5 (H) 09/18/2019   MCV 85 09/18/2019   PLT 312 09/18/2019   No results found for: IRON, TIBC, FERRITIN  Obesity Behavioral Intervention:   Approximately 15 minutes were spent on the discussion below.  ASK: We discussed the diagnosis of obesity with Rilei today and Johnathan agreed to give [place] permission to discuss obesity behavioral modification therapy today.  ASSESS: [name] has the diagnosis of obesity and her BMI today is 51.2. Shequita is in the action stage of change.   ADVISE: Lasharn was educated on the multiple health risks of obesity as well as the benefit of weight loss to improve her health. She was advised of the need for long term  treatment and the importance of lifestyle modifications to improve her current health and to decrease her risk of future health problems.  AGREE: Multiple dietary modification options and treatment options were discussed and Vasilia agreed to follow the recommendations documented in the above note.  ARRANGE: [place] was educated on the importance of frequent visits to treat obesity as outlined per CMS and USPSTF guidelines and agreed to schedule her next follow up appointment today.  Attestation Statements:   Reviewed by clinician on day of visit: allergies, medications, problem list, medical history, surgical history, family history, social history, and previous encounter notes.  I, Michaelene Song, am acting as Location manager for [employer], NP-C   I have reviewed the above documentation for accuracy and completeness, and I agree with the above. -  Pape Parson d. Madelyn Tlatelpa, NP-C

## 2020-02-26 ENCOUNTER — Telehealth (INDEPENDENT_AMBULATORY_CARE_PROVIDER_SITE_OTHER): Payer: Self-pay | Admitting: Adult Health

## 2020-02-26 NOTE — Telephone Encounter (Signed)
Needs prescription sent to [facility] instead of [facility] from her last visit

## 2020-02-26 NOTE — Telephone Encounter (Signed)
[name]

## 2020-02-27 ENCOUNTER — Other Ambulatory Visit (INDEPENDENT_AMBULATORY_CARE_PROVIDER_SITE_OTHER): Payer: Self-pay

## 2020-02-27 ENCOUNTER — Encounter (INDEPENDENT_AMBULATORY_CARE_PROVIDER_SITE_OTHER): Payer: Self-pay

## 2020-02-27 DIAGNOSIS — E1169 Type 2 diabetes mellitus with other specified complication: Secondary | ICD-10-CM

## 2020-02-27 MED ORDER — ATORVASTATIN CALCIUM 40 MG PO TABS: 40.0000 mg | ORAL_TABLET | Freq: Every day | ORAL | 0 refills | Status: DC

## 2020-02-27 NOTE — Telephone Encounter (Signed)
New rx sent to [facility]. rx to [facility] cancelled.

## 2020-03-04 ENCOUNTER — Other Ambulatory Visit: Payer: Self-pay | Admitting: Internal Medicine

## 2020-03-04 DIAGNOSIS — Z1231 Encounter for screening mammogram for malignant neoplasm of breast: Secondary | ICD-10-CM

## 2020-03-10 ENCOUNTER — Other Ambulatory Visit: Payer: Self-pay

## 2020-03-10 ENCOUNTER — Encounter (INDEPENDENT_AMBULATORY_CARE_PROVIDER_SITE_OTHER): Payer: Self-pay | Admitting: Adult Health

## 2020-03-10 ENCOUNTER — Ambulatory Visit (INDEPENDENT_AMBULATORY_CARE_PROVIDER_SITE_OTHER): Payer: Medicare HMO | Admitting: Adult Health

## 2020-03-10 VITALS — BP 134/75 | HR 59 | Temp 98.0°F | Ht 61.0 in | Wt 268.0 lb

## 2020-03-10 DIAGNOSIS — E1169 Type 2 diabetes mellitus with other specified complication: Secondary | ICD-10-CM | POA: Diagnosis not present

## 2020-03-10 DIAGNOSIS — E559 Vitamin D deficiency, unspecified: Secondary | ICD-10-CM

## 2020-03-10 DIAGNOSIS — E785 Hyperlipidemia, unspecified: Secondary | ICD-10-CM | POA: Diagnosis not present

## 2020-03-10 DIAGNOSIS — Z6841 Body Mass Index (BMI) 40.0 and over, adult: Secondary | ICD-10-CM

## 2020-03-10 NOTE — Progress Notes (Signed)
Chief Complaint:   OBESITY [name] is here to discuss her progress with her obesity treatment plan along with follow-up of her obesity related diagnoses. [name] is on the Category 3 Plan and states she is following her eating plan approximately 100% of the time. [name] states she is walking 30 minutes 3 times per week.  Today's visit was #: 12 Starting weight: 266 lbs Starting date: 09/18/2019 Today's weight: 268 lbs Today's date: 03/10/2020 Total lbs lost to date: 0 Total lbs lost since last in-office visit: 3  Interim History: [name] will host 10 family members for Christmas. She is fully vaccinated with [place] and booster in early October 2021. She has really been focused on the Category 3 meal plan 100% of the time and lost 3 lbs since the lat OV.   Subjective:   Hyperlipidemia associated with type 2 diabetes mellitus ([place]). Leylany was changed to atorvastatin 40 mg daily 02/18/2020 due to LDL of 101, which is above goal of 70 for diabetics. She is tolerating the change in statin well.  Lab Results  Component Value Date   CHOL 171 02/04/2020   HDL 47 02/04/2020   LDLCALC 101 (H) 02/04/2020   LDLDIRECT 66 04/09/2009   TRIG 130 02/04/2020   CHOLHDL 3.6 02/04/2020   Lab Results  Component Value Date   ALT 11 02/04/2020   AST 10 02/04/2020   ALKPHOS 81 02/04/2020   BILITOT 0.4 02/04/2020   The 10-year ASCVD risk score Theresa Bussing DC Jr., et al., 2013) is: 17.5%   Values used to calculate the score:     Age: 1 years     Sex: Female     Is Non-Hispanic African American: Yes     Diabetic: Yes     Tobacco smoker: No     Systolic Blood Pressure: 119 mmHg     Is BP treated: Yes     HDL Cholesterol: 47 mg/dL     Total Cholesterol: 171 mg/dL  Vitamin D deficiency. [place] completed Ergocalciferol and then started OTC Vitamin D3 5,000 IU daily.   Ref. Range 02/04/2020 08:43  Vitamin D, 25-Hydroxy Latest Ref Range: 30.0 - 100.0 ng/mL 49.3    Assessment/Plan:   Hyperlipidemia associated with type 2 diabetes mellitus ([name]). Cardiovascular risk and specific lipid/LDL goals reviewed.  We discussed several lifestyle modifications today and [name] will continue to work on diet, exercise and weight loss efforts. Orders and follow up as documented in patient record. Labs will be checked at her next office visit - CMP and lipids.  Counseling Intensive lifestyle modifications are the first line treatment for this issue.  Dietary changes: Increase soluble fiber. Decrease simple carbohydrates.  Exercise changes: Moderate to vigorous-intensity aerobic activity 150 minutes per week if tolerated.   Lipid-lowering medications: see documented in medical record.  Vitamin D deficiency. Low Vitamin D level contributes to fatigue and are associated with obesity, breast, and colon cancer. She agrees to continue to take OTC Vitamin D3 5,000 IU daily and will follow-up for routine testing of Vitamin D, at least 2-3 times per year to avoid over-replacement.  Class 3 severe obesity with serious comorbidity and body mass index (BMI) of 50.0 to 59.9 in adult, unspecified obesity type ([place]).  Denajah is currently in the action stage of change. As such, her goal is to continue with weight loss efforts. She has agreed to the Category 3 Plan.   Handouts were provided on Holiday Recipes and Holiday Strategies.  Exercise goals:  [name] will hold walking at 30 minutes 3 times per week for exercise.  Behavioral modification strategies: increasing lean protein intake, meal planning and cooking strategies, better snacking choices and planning for success.  [name] has agreed to follow-up with our clinic fasting in 2-3 weeks. She was informed of the importance of frequent follow-up visits to maximize her success with intensive lifestyle modifications for her multiple health conditions.   Objective:   Blood pressure 134/75, pulse (!) 59, temperature 98 F  (36.7 C), height 5\' 1"  (1.549 m), weight 268 lb (121.6 kg), SpO2 98 %. Body mass index is 50.64 kg/m.  General: Cooperative, alert, well developed, in no acute distress. HEENT: Conjunctivae and lids unremarkable. Cardiovascular: Regular rhythm.  Lungs: Normal work of breathing. Neurologic: No focal deficits.   Lab Results  Component Value Date   CREATININE 0.95 02/04/2020   BUN 23 02/04/2020   NA 145 (H) 02/04/2020   K 4.5 02/04/2020   CL 100 02/04/2020   CO2 28 02/04/2020   Lab Results  Component Value Date   ALT 11 02/04/2020   AST 10 02/04/2020   ALKPHOS 81 02/04/2020   BILITOT 0.4 02/04/2020   Lab Results  Component Value Date   HGBA1C 7.5 (H) 02/04/2020   HGBA1C 8.2 (H) 09/18/2019   HGBA1C 11.8 07/22/2009   HGBA1C >14.0 04/09/2009   HGBA1C 12.3 12/27/2008   No results found for: INSULIN Lab Results  Component Value Date   TSH 0.687 09/18/2019   Lab Results  Component Value Date   CHOL 171 02/04/2020   HDL 47 02/04/2020   LDLCALC 101 (H) 02/04/2020   LDLDIRECT 66 04/09/2009   TRIG 130 02/04/2020   CHOLHDL 3.6 02/04/2020   Lab Results  Component Value Date   WBC 6.2 09/18/2019   HGB 15.3 09/18/2019   HCT 47.5 (H) 09/18/2019   MCV 85 09/18/2019   PLT 312 09/18/2019   No results found for: IRON, TIBC, FERRITIN  Attestation Statements:   Reviewed by clinician on day of visit: allergies, medications, problem list, medical history, surgical history, family history, social history, and previous encounter notes.  Time spent on visit including pre-visit chart review and post-visit charting and care was 27 minutes.   I, Michaelene Song, am acting as Location manager for [employer], NP-C   I have reviewed the above documentation for accuracy and completeness, and I agree with the above. -  Murielle Stang d. Caroleena Paolini, NP-C

## 2020-03-24 ENCOUNTER — Other Ambulatory Visit: Payer: Self-pay

## 2020-03-24 ENCOUNTER — Ambulatory Visit (INDEPENDENT_AMBULATORY_CARE_PROVIDER_SITE_OTHER): Payer: Medicare HMO | Admitting: Clinical

## 2020-03-24 DIAGNOSIS — F330 Major depressive disorder, recurrent, mild: Secondary | ICD-10-CM

## 2020-03-24 NOTE — Progress Notes (Signed)
   THERAPIST PROGRESS NOTE Virtual Visit via Video Note  I connected with [name] on 03/24/20 at  3:00 PM EST by a video enabled telemedicine application and verified that I am speaking with the correct person using two identifiers.  Location: Patient: home Provider: office   I discussed the limitations of evaluation and management by telemedicine and the availability of in person appointments. The patient expressed understanding and agreed to proceed.  Follow Up Instructions: I discussed the assessment and treatment plan with the patient. The patient was provided an opportunity to ask questions and all were answered. The patient agreed with the plan and demonstrated an understanding of the instructions.   The patient was advised to call back or seek an in-person evaluation if the symptoms worsen or if the condition fails to improve as anticipated.   Session Time: 25 minutes  Participation Level: Active  Behavioral Response: CasualAlertDepressed  Type of Therapy: Individual Therapy  Treatment Goals addressed: Diagnosis: Depression  Interventions: CBT  Summary:  [name] is a 60 y.o. female who presents oriented times five, appropriately dressed, and friendly. Client denied hallucinations and delusions. Client reported on today feeling okay but having a depressed mood. Client reported since the last session she celebrated her birthday with family unlink last year. Client reported overall her holidays were good. Client reported she has been struggling with depressed mood about her finances, health, and some paranoia about COVID. Client reported "I get paranoid about COVID in the news". Client reported she does not sleep good at all due to anxiety feeling. Client reported earlier this year when she contracted COVID she did not know how she got it other than possible contact from someone at a grocery store because she did not have company over her house. Client was  tearful talking about her experience. Client reported she has since been vaccinated and her children tell her to try to get out and walk and wear her mask as she should. Client reported she tries to get out of her house to walk a few days a week. Client reported she has chronic knee pain and shoulder pain which her doctors are trying to manage. Client reported her grandchildren keep good company and call her daily to check on her. Client reported that relationship with them makes her happy. Client reported she keeps herself content by doing puzzles, tending to her plants, prayer and reading the bible.    Suicidal/Homicidal: Nowithout intent/plan  Therapist Response:  Therapist began the session by checking in and asking the client how she has been doing since last seen.  Therapist actively listened to the clients thoughts and feelings. Therapist used empathy in response. Therapist collaborated with the client to discuss her underlying beliefs about some of her stressors and how it has affected her and her family. Therapist used CBT to help normalize the clients feelings of uncertainty and challenge it with facts of how she is doing things in her control to help her situation. Therapist assigned the client homework to be consistent with her current coping skills of physical exercise, hobbies, and staying in contact with her family/ support persons. Client was scheduled for next appointment.    Plan: Return again in 3 weeks for individual therapy.  Diagnosis: Major depressive disorder recurrent episode, mild   Neena Rhymes Quadarius Henton, LCSW 03/24/2020

## 2020-04-01 ENCOUNTER — Encounter (INDEPENDENT_AMBULATORY_CARE_PROVIDER_SITE_OTHER): Payer: Self-pay | Admitting: Adult Health

## 2020-04-01 ENCOUNTER — Other Ambulatory Visit: Payer: Self-pay

## 2020-04-01 ENCOUNTER — Ambulatory Visit (INDEPENDENT_AMBULATORY_CARE_PROVIDER_SITE_OTHER): Payer: Medicare HMO | Admitting: Adult Health

## 2020-04-01 VITALS — BP 105/61 | HR 63 | Temp 98.0°F | Ht 61.0 in | Wt 274.0 lb

## 2020-04-01 DIAGNOSIS — E1159 Type 2 diabetes mellitus with other circulatory complications: Secondary | ICD-10-CM | POA: Diagnosis not present

## 2020-04-01 DIAGNOSIS — I152 Hypertension secondary to endocrine disorders: Secondary | ICD-10-CM

## 2020-04-01 DIAGNOSIS — R0602 Shortness of breath: Secondary | ICD-10-CM | POA: Diagnosis not present

## 2020-04-01 DIAGNOSIS — E785 Hyperlipidemia, unspecified: Secondary | ICD-10-CM

## 2020-04-01 DIAGNOSIS — E1169 Type 2 diabetes mellitus with other specified complication: Secondary | ICD-10-CM | POA: Diagnosis not present

## 2020-04-01 DIAGNOSIS — Z6835 Body mass index (BMI) 35.0-35.9, adult: Secondary | ICD-10-CM | POA: Diagnosis not present

## 2020-04-01 MED ORDER — ATORVASTATIN CALCIUM 40 MG PO TABS: 40.0000 mg | ORAL_TABLET | Freq: Every day | ORAL | 0 refills | Status: DC

## 2020-04-02 NOTE — Progress Notes (Signed)
Chief Complaint:   OBESITY Petula is here to discuss her progress with her obesity treatment plan along with follow-up of her obesity related diagnoses. [place] is on the Category 3 Plan and states she is following her eating plan approximately 100% of the time. Danna states she is exercising 0 minutes 0 times per week.  Today's visit was #: 110 Starting weight: 266 lbs Starting date: 09/18/2019 Today's weight: 274 lbs Today's date: 04/01/2020 Total lbs lost to date: 0 Total lbs lost since last in-office visit: 0  Interim History: Lashannon celebrated her birthday with vegetable and protein rich dinner and avoided sweets. She has not received atorvastatin prescription from [facility].She remained on simvastatin during the interim. Her goals for 2022 are to loss weight and "just feel better!"  Subjective:   1. Hyperlipidemia associated with type 2 diabetes mellitus (HCC) Recently changed from simvastatin 40 mg to atorvastatin 40 mg due to LDL of 101, which is above goal of 70 for a diabetic. Statin change end of November 2021, however [facility] has not delivered new prescription. She has continued her previous simvastatin prescription in the interim.   Lab Results  Component Value Date   ALT 11 02/04/2020   AST 10 02/04/2020   ALKPHOS 81 02/04/2020   BILITOT 0.4 02/04/2020   Lab Results  Component Value Date   CHOL 171 02/04/2020   HDL 47 02/04/2020   LDLCALC 101 (H) 02/04/2020   LDLDIRECT 66 04/09/2009   TRIG 130 02/04/2020   CHOLHDL 3.6 02/04/2020    2. Hypertension associated with type 2 diabetes mellitus (HCC) BP/HR stable at OV. Ambulatory systolic BP 144-818, diastolic BP 56-31. She will experience fatigue, dizziness when systolic BP is in the low 100's. She denies chest pain, has been experiencing dyspnea the last 3 weeks. She is on multiple anti-hypertensives and is managed by PCP.  3. SOB (shortness of breath) Recent increased dyspnea the last 3  weeks. She is on Furosemide 20 mg daily. She denies tobacco/vape use. Upon chart review, ED visit for epigastric pain and remote history of SOB (03/01/2020). Cardiac etiology unlikely. Discharged to home with protonix and follow up with PCP.  Assessment/Plan:   1. Hyperlipidemia associated with type 2 diabetes mellitus ([place]) Cardiovascular risk and specific lipid/LDL goals reviewed.  We discussed several lifestyle modifications today and Jaimie will continue to work on diet, exercise and weight loss efforts. Orders and follow up as documented in patient record. We will send refill of atorvastatin 40 mg to local pharmacy to bridge while waiting on mail order. Once you receive Atorvastatin, STOP SIMVASTATIN.  Pt. Verbalized understanding/agreement.  Counseling Intensive lifestyle modifications are the first line treatment for this issue. . Dietary changes: Increase soluble fiber. Decrease simple carbohydrates. . Exercise changes: Moderate to vigorous-intensity aerobic activity 150 minutes per week if tolerated. . Lipid-lowering medications: see documented in medical record.   - atorvastatin (LIPITOR) 40 MG tablet; Take 1 tablet (40 mg total) by mouth daily.  Dispense: 60 tablet; Refill: 0- this is bridge Rx until mail order supply is received.  2. Hypertension associated with type 2 diabetes mellitus ([place]) Check blood pressure and heart rate at home and record and bring log to follow up with PCP next week. Rashiya is working on healthy weight loss and exercise to improve blood pressure control. We will watch for signs of hypotension as she continues her lifestyle modifications.  3. SOB (shortness of breath) Monitor symptoms and discuss with PCP at  OV next week. If ANY Red flag symptoms develop, seek immediate medical assistance. Pt verbalized understanding and agrees.  4. Class 2 severe obesity with serious comorbidity and body mass index (BMI) of 35.0 to 35.9 in adult, unspecified obesity type  ([place]) Zeena is currently in the action stage of change. As such, her goal is to continue with weight loss efforts. She has agreed to the Category 3 Plan.   Monitor SOB symptoms. Check blood pressure and heart rate at home. Take log too follow up with PCP appointment next week.  Exercise goals: No exercise has been prescribed at this time.  Behavioral modification strategies: increasing lean protein intake, decreasing simple carbohydrates, no skipping meals, meal planning and cooking strategies, better snacking choices and planning for success.  Iyauna has agreed to follow-up with our clinic in 2-3 weeks. She was informed of the importance of frequent follow-up visits to maximize her success with intensive lifestyle modifications for her multiple health conditions.   Objective:   Blood pressure 105/61, pulse 63, temperature 98 F (36.7 C), height 5\' 1"  (1.549 m), weight 274 lb (124.3 kg), SpO2 97 %. Body mass index is 51.77 kg/m.  General: Cooperative, alert, well developed, in no acute distress. HEENT: Conjunctivae and lids unremarkable. Cardiovascular: Regular rhythm.  Lungs: Normal work of breathing. Neurologic: No focal deficits.   Lab Results  Component Value Date   CREATININE 0.95 02/04/2020   BUN 23 02/04/2020   NA 145 (H) 02/04/2020   K 4.5 02/04/2020   CL 100 02/04/2020   CO2 28 02/04/2020   Lab Results  Component Value Date   ALT 11 02/04/2020   AST 10 02/04/2020   ALKPHOS 81 02/04/2020   BILITOT 0.4 02/04/2020   Lab Results  Component Value Date   HGBA1C 7.5 (H) 02/04/2020   HGBA1C 8.2 (H) 09/18/2019   HGBA1C 11.8 07/22/2009   HGBA1C >14.0 04/09/2009   HGBA1C 12.3 12/27/2008   No results found for: INSULIN Lab Results  Component Value Date   TSH 0.687 09/18/2019   Lab Results  Component Value Date   CHOL 171 02/04/2020   HDL 47 02/04/2020   LDLCALC 101 (H) 02/04/2020   LDLDIRECT 66 04/09/2009   TRIG 130 02/04/2020   CHOLHDL 3.6 02/04/2020    Lab Results  Component Value Date   WBC 6.2 09/18/2019   HGB 15.3 09/18/2019   HCT 47.5 (H) 09/18/2019   MCV 85 09/18/2019   PLT 312 09/18/2019   No results found for: IRON, TIBC, FERRITIN  Obesity Behavioral Intervention:   Approximately 15 minutes were spent on the discussion below.  ASK: We discussed the diagnosis of obesity with Marija today and Kabao agreed to give [place] permission to discuss obesity behavioral modification therapy today.  ASSESS: Zohal has the diagnosis of obesity and her BMI today is 35.2. Wilmoth is in the action stage of change.   ADVISE: Samanthajo was educated on the multiple health risks of obesity as well as the benefit of weight loss to improve her health. She was advised of the need for long term treatment and the importance of lifestyle modifications to improve her current health and to decrease her risk of future health problems.  AGREE: Multiple dietary modification options and treatment options were discussed and Alfred agreed to follow the recommendations documented in the above note.  ARRANGE: Bernadene was educated on the importance of frequent visits to treat obesity as outlined per CMS and USPSTF guidelines and agreed to schedule her next follow up  appointment today.  Attestation Statements:   Reviewed by clinician on day of visit: allergies, medications, problem list, medical history, surgical history, family history, social history, and previous encounter notes.  Coral Ceo, am acting as Location manager for Mina Marble, NP.  I have reviewed the above documentation for accuracy and completeness, and I agree with the above. -  Avonne Berkery d. Kahliya Fraleigh, NP-C

## 2020-04-05 DIAGNOSIS — R0602 Shortness of breath: Secondary | ICD-10-CM | POA: Insufficient documentation

## 2020-04-07 ENCOUNTER — Ambulatory Visit (INDEPENDENT_AMBULATORY_CARE_PROVIDER_SITE_OTHER): Payer: Medicare HMO | Admitting: Clinical

## 2020-04-07 ENCOUNTER — Other Ambulatory Visit: Payer: Self-pay

## 2020-04-07 DIAGNOSIS — F330 Major depressive disorder, recurrent, mild: Secondary | ICD-10-CM | POA: Diagnosis not present
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
## 2020-04-14 ENCOUNTER — Other Ambulatory Visit: Payer: Self-pay

## 2020-04-14 ENCOUNTER — Ambulatory Visit
Admission: RE | Admit: 2020-04-14 | Discharge: 2020-04-14 | Disposition: A | Discharge status: Home / Self Care | Payer: Medicare HMO | Source: Ambulatory Visit | Attending: Internal Medicine | Admitting: Internal Medicine

## 2020-04-14 DIAGNOSIS — Z1231 Encounter for screening mammogram for malignant neoplasm of breast: Secondary | ICD-10-CM

## 2020-04-15 ENCOUNTER — Ambulatory Visit (INDEPENDENT_AMBULATORY_CARE_PROVIDER_SITE_OTHER): Payer: Medicare HMO | Admitting: Adult Health

## 2020-04-15 ENCOUNTER — Encounter (INDEPENDENT_AMBULATORY_CARE_PROVIDER_SITE_OTHER): Payer: Self-pay | Admitting: Adult Health

## 2020-04-15 VITALS — BP 135/81 | HR 65 | Temp 98.1°F | Ht 61.0 in | Wt 275.0 lb

## 2020-04-15 DIAGNOSIS — E1169 Type 2 diabetes mellitus with other specified complication: Secondary | ICD-10-CM | POA: Diagnosis not present

## 2020-04-15 DIAGNOSIS — Z6841 Body Mass Index (BMI) 40.0 and over, adult: Secondary | ICD-10-CM | POA: Diagnosis not present

## 2020-04-15 DIAGNOSIS — E785 Hyperlipidemia, unspecified: Secondary | ICD-10-CM | POA: Diagnosis not present
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
## 2020-04-29 ENCOUNTER — Telehealth (INDEPENDENT_AMBULATORY_CARE_PROVIDER_SITE_OTHER): Payer: Medicare HMO | Admitting: Adult Health

## 2020-04-29 ENCOUNTER — Encounter (INDEPENDENT_AMBULATORY_CARE_PROVIDER_SITE_OTHER): Payer: Self-pay | Admitting: Adult Health

## 2020-04-29 DIAGNOSIS — E1169 Type 2 diabetes mellitus with other specified complication: Secondary | ICD-10-CM

## 2020-04-29 DIAGNOSIS — E785 Hyperlipidemia, unspecified: Secondary | ICD-10-CM

## 2020-04-29 DIAGNOSIS — Z6841 Body Mass Index (BMI) 40.0 and over, adult: Secondary | ICD-10-CM | POA: Diagnosis not present

## 2020-04-30 ENCOUNTER — Encounter (INDEPENDENT_AMBULATORY_CARE_PROVIDER_SITE_OTHER): Payer: Self-pay | Admitting: Adult Health

## 2020-04-30 NOTE — Progress Notes (Signed)
TeleHealth Visit:  Due to the COVID-19 pandemic, this visit was completed with telemedicine (audio/video) technology to reduce patient and provider exposure as well as to preserve personal protective equipment.   [name] has verbally consented to this TeleHealth visit. The patient is located at home, the provider is located at the [facility] office. The participants in this visit include the listed provider and patient. The visit was conducted today via video.   Chief Complaint: OBESITY [name] is here to discuss her progress with her obesity treatment plan along with follow-up of her obesity related diagnoses. [name] is on the Category 3 Plan and states she is following her eating plan approximately 90% of the time. [name] states she is upper body exercises 30 minutes 2 times per week.  Today's visit was #: 15 Starting weight: 266 lbs Starting date: 09/18/2019  Interim History: [name] is experiencing an exacerbation of chronic left hip pain. Symptoms began 04/23/2020. She denies acute accident/trauma prior to onset of pain. She has been ambulating with cane and denies any recent falls. She has been performing upper body exercises to remain active. She will follow up with her PCP for the left hip pain.    Subjective:   1. Hyperlipidemia associated with type 2 diabetes mellitus ([place]) [name] has received mail order atorvastatin from [facility]. She is taking atorvastatin 40 mg daily and tolerating it well.   Lab Results  Component Value Date   ALT 11 02/04/2020   AST 10 02/04/2020   ALKPHOS 81 02/04/2020   BILITOT 0.4 02/04/2020   Lab Results  Component Value Date   CHOL 171 02/04/2020   HDL 47 02/04/2020   LDLCALC 101 (H) 02/04/2020   LDLDIRECT 66 04/09/2009   TRIG 130 02/04/2020   CHOLHDL 3.6 02/04/2020   2. Type 2 diabetes mellitus with other specified complication, without long-term current use of insulin (HCC) [name]'s ambulatory fasting BG 110-120's. She  denies episodes of hypoglycemia. She is on Metformin, Ozempic, Invokana, glipizide, and Lantus managed by PCP.  Lab Results  Component Value Date   HGBA1C 7.5 (H) 02/04/2020   HGBA1C 8.2 (H) 09/18/2019   HGBA1C 11.8 07/22/2009   Lab Results  Component Value Date   LDLCALC 101 (H) 02/04/2020   CREATININE 0.95 02/04/2020   No results found for: INSULIN  Assessment/Plan:   1. Hyperlipidemia associated with type 2 diabetes mellitus ([place]) Cardiovascular risk and specific lipid/LDL goals reviewed.  We discussed several lifestyle modifications today and [name] will continue to work on diet, exercise and weight loss efforts. Orders and follow up as documented in patient record. Continue atorvastatin 40 mg daily. Check labs at next OV.  Counseling Intensive lifestyle modifications are the first line treatment for this issue. . Dietary changes: Increase soluble fiber. Decrease simple carbohydrates. . Exercise changes: Moderate to vigorous-intensity aerobic activity 150 minutes per week if tolerated. . Lipid-lowering medications: see documented in medical record.  2. Type 2 diabetes mellitus with other specified complication, without long-term current use of insulin (HCC) Good blood sugar control is important to decrease the likelihood of diabetic complications such as nephropathy, neuropathy, limb loss, blindness, coronary artery disease, and death. Intensive lifestyle modification including diet, exercise and weight loss are the first line of treatment for diabetes. Check labs at next OV.  3. Class 3 severe obesity with serious comorbidity and body mass index (BMI) of 50.0 to 59.9 in adult, unspecified obesity type ([place]) [name] is currently in the action stage of change. As such,  her goal is to continue with weight loss efforts. She has agreed to the Category 3 Plan.   Needs fasting labs at next OV.  Exercise goals: As is  Behavioral modification strategies: increasing lean protein  intake, meal planning and cooking strategies and planning for success.  [name] has agreed to follow-up with our clinic in 2 weeks. She was informed of the importance of frequent follow-up visits to maximize her success with intensive lifestyle modifications for her multiple health conditions.  Objective:   VITALS: Per patient if applicable, see vitals. GENERAL: Alert and in no acute distress. CARDIOPULMONARY: No increased WOB. Speaking in clear sentences.  PSYCH: Pleasant and cooperative. Speech normal rate and rhythm. Affect is appropriate. Insight and judgement are appropriate. Attention is focused, linear, and appropriate.  NEURO: Oriented as arrived to appointment on time with no prompting.   Lab Results  Component Value Date   CREATININE 0.95 02/04/2020   BUN 23 02/04/2020   NA 145 (H) 02/04/2020   K 4.5 02/04/2020   CL 100 02/04/2020   CO2 28 02/04/2020   Lab Results  Component Value Date   ALT 11 02/04/2020   AST 10 02/04/2020   ALKPHOS 81 02/04/2020   BILITOT 0.4 02/04/2020   Lab Results  Component Value Date   HGBA1C 7.5 (H) 02/04/2020   HGBA1C 8.2 (H) 09/18/2019   HGBA1C 11.8 07/22/2009   HGBA1C >14.0 04/09/2009   HGBA1C 12.3 12/27/2008   No results found for: INSULIN Lab Results  Component Value Date   TSH 0.687 09/18/2019   Lab Results  Component Value Date   CHOL 171 02/04/2020   HDL 47 02/04/2020   LDLCALC 101 (H) 02/04/2020   LDLDIRECT 66 04/09/2009   TRIG 130 02/04/2020   CHOLHDL 3.6 02/04/2020   Lab Results  Component Value Date   WBC 6.2 09/18/2019   HGB 15.3 09/18/2019   HCT 47.5 (H) 09/18/2019   MCV 85 09/18/2019   PLT 312 09/18/2019   No results found for: IRON, TIBC, FERRITIN  Attestation Statements:   Reviewed by clinician on day of visit: allergies, medications, problem list, medical history, surgical history, family history, social history, and previous encounter notes.  Time spent on visit including pre-visit chart review and  post-visit charting and care was 33 minutes.   Coral Ceo, am acting as Location manager for Mina Marble, NP.  I have reviewed the above documentation for accuracy and completeness, and I agree with the above. - Kenrick Pore d. Masen Luallen, NP-C

## 2020-05-02 ENCOUNTER — Encounter (INDEPENDENT_AMBULATORY_CARE_PROVIDER_SITE_OTHER): Payer: Self-pay | Admitting: Adult Health

## 2020-05-05 NOTE — Telephone Encounter (Signed)
Please advise 

## 2020-05-05 NOTE — Telephone Encounter (Signed)
Last OV with Katy 

## 2020-05-05 NOTE — Telephone Encounter (Signed)
fyi

## 2020-05-06 ENCOUNTER — Ambulatory Visit: Payer: Medicare HMO | Admitting: Obstetrics and Gynecology

## 2020-05-13 ENCOUNTER — Encounter (INDEPENDENT_AMBULATORY_CARE_PROVIDER_SITE_OTHER): Payer: Self-pay | Admitting: Adult Health

## 2020-05-13 ENCOUNTER — Ambulatory Visit (INDEPENDENT_AMBULATORY_CARE_PROVIDER_SITE_OTHER): Payer: Medicare HMO | Admitting: Adult Health

## 2020-05-13 ENCOUNTER — Other Ambulatory Visit: Payer: Self-pay

## 2020-05-13 VITALS — BP 114/71 | HR 84 | Temp 97.8°F | Ht 61.0 in | Wt 276.0 lb

## 2020-05-13 DIAGNOSIS — Z6841 Body Mass Index (BMI) 40.0 and over, adult: Secondary | ICD-10-CM | POA: Diagnosis not present

## 2020-05-13 DIAGNOSIS — E669 Obesity, unspecified: Secondary | ICD-10-CM | POA: Diagnosis not present

## 2020-05-13 DIAGNOSIS — E785 Hyperlipidemia, unspecified: Secondary | ICD-10-CM

## 2020-05-13 DIAGNOSIS — E1169 Type 2 diabetes mellitus with other specified complication: Secondary | ICD-10-CM

## 2020-05-13 DIAGNOSIS — G8929 Other chronic pain: Secondary | ICD-10-CM

## 2020-05-14 LAB — COMPREHENSIVE METABOLIC PANEL
ALT: 27 IU/L (ref 0–32)
AST: 22 IU/L (ref 0–40)
Albumin/Globulin Ratio: 1.3 (ref 1.2–2.2)
Albumin: 4.2 g/dL (ref 3.8–4.8)
Alkaline Phosphatase: 78 IU/L (ref 44–121)
BUN/Creatinine Ratio: 21 (ref 12–28)
BUN: 19 mg/dL (ref 8–27)
Bilirubin Total: 0.5 mg/dL (ref 0.0–1.2)
CO2: 27 mmol/L (ref 20–29)
Calcium: 9.8 mg/dL (ref 8.7–10.3)
Chloride: 99 mmol/L (ref 96–106)
Creatinine, Ser: 0.92 mg/dL (ref 0.57–1.00)
GFR calc Af Amer: 77 mL/min/{1.73_m2} (ref >59)
GFR calc non Af Amer: 67 mL/min/{1.73_m2} (ref >59)
Globulin, Total: 3.3 g/dL (ref 1.5–4.5)
Glucose: 103 mg/dL — ABNORMAL HIGH (ref 65–99)
Potassium: 4.3 mmol/L (ref 3.5–5.2)
Sodium: 142 mmol/L (ref 134–144)
Total Protein: 7.5 g/dL (ref 6.0–8.5)

## 2020-05-14 LAB — HEMOGLOBIN A1C
Est. average glucose Bld gHb Est-mCnc: 203 mg/dL
Hgb A1c MFr Bld: 8.7 % — ABNORMAL HIGH (ref 4.8–5.6)

## 2020-05-14 LAB — LIPID PANEL
Chol/HDL Ratio: 3.5 ratio (ref 0.0–4.4)
Cholesterol, Total: 149 mg/dL (ref 100–199)
HDL: 43 mg/dL (ref >39)
LDL Chol Calc (NIH): 82 mg/dL (ref 0–99)
Triglycerides: 136 mg/dL (ref 0–149)
VLDL Cholesterol Cal: 24 mg/dL (ref 5–40)

## 2020-05-14 NOTE — Progress Notes (Signed)
Chief Complaint:   OBESITY [name] is here to discuss her progress with her obesity treatment plan along with follow-up of her obesity related diagnoses. [name] is on the Category 3 Plan and states she is following her eating plan approximately 90% of the time. [name] states she is walking and stretching 30 minutes 3 times per week.  Today's visit was #: 15 Starting weight: 266 lbs Starting date: 09/18/2019 Today's weight: 276 Today's date: 05/13/2020 Total lbs lost to date: 0 Total lbs lost since last in-office visit: 0  Interim History: [name] has been as active as tolerated due to chronic pain- bilateral shoulder/hip/knees. She will frequently walk and stretch indoors if the weather is poor. She will walk with her daughter outside as often as weather permits.  Subjective:   1. Hyperlipidemia associated with type 2 diabetes mellitus ([place]) [name]'s statin therapy was changed from simvastatin 40 mg daily to atorvastatin 40 mg daily due to LDL above goal for Diabetic. New statin started on 04/01/2020. She denies GI upset or increase in joint and muscle pain.  2. Type 2 diabetes mellitus with other specified complication, without long-term current use of insulin (HCC) [name]'s ambulatory fasting BG 120-215, typically 140-160's. She is on multiple anti-diabetic prescriptions.  3. Other chronic pain [name] reports bilateral shoulder, hip, and knee pains. She has been taking Tylenol Arthritis. She is taking 3 tablets daily (2 in AM and 1 in afternoon). She has been on this everyday for 2 weeks.  Assessment/Plan:   1. Hyperlipidemia associated with type 2 diabetes mellitus ([place]) Cardiovascular risk and specific lipid/LDL goals reviewed.  We discussed several lifestyle modifications today and Lanecia will continue to work on diet, exercise and weight loss efforts. Orders and follow up as documented in patient record. Check labs today.  Counseling Intensive lifestyle modifications  are the first line treatment for this issue. . Dietary changes: Increase soluble fiber. Decrease simple carbohydrates. . Exercise changes: Moderate to vigorous-intensity aerobic activity 150 minutes per week if tolerated. . Lipid-lowering medications: see documented in medical record.  - Comprehensive metabolic panel - Lipid panel  2. Type 2 diabetes mellitus with other specified complication, without long-term current use of insulin (HCC) Good blood sugar control is important to decrease the likelihood of diabetic complications such as nephropathy, neuropathy, limb loss, blindness, coronary artery disease, and death. Intensive lifestyle modification including diet, exercise and weight loss are the first line of treatment for diabetes. Check labs today.  - Comprehensive metabolic panel - Hemoglobin A1c  3. Other chronic pain Stop Tylenol Arthritis- not intended for >10 days of consistent use. Use heat/cold therapy.  4. Class 3 severe obesity with serious comorbidity and body mass index (BMI) of 50.0 to 59.9 in adult, unspecified obesity type ([place]) [name] is currently in the action stage of change. As such, her goal is to continue with weight loss efforts. She has agreed to the Category 3 Plan.   Exercise goals: As is  Behavioral modification strategies: increasing lean protein intake, meal planning and cooking strategies and planning for success.  [name] has agreed to follow-up with our clinic in 2-3 weeks. She was informed of the importance of frequent follow-up visits to maximize her success with intensive lifestyle modifications for her multiple health conditions.   [name] was informed we would discuss her lab results at her next visit unless there is a critical issue that needs to be addressed sooner. [name] agreed to keep her next visit at the agreed upon time to  discuss these results.  Objective:   Blood pressure 114/71, pulse 84, temperature 97.8 F (36.6 C), height 5\' 1"   (1.549 m), weight 276 lb (125.2 kg), SpO2 97 %. Body mass index is 52.15 kg/m.  General: Cooperative, alert, well developed, in no acute distress. HEENT: Conjunctivae and lids unremarkable. Cardiovascular: Regular rhythm.  Lungs: Normal work of breathing. Neurologic: No focal deficits.   Lab Results  Component Value Date   CREATININE 0.92 05/13/2020   BUN 19 05/13/2020   NA 142 05/13/2020   K 4.3 05/13/2020   CL 99 05/13/2020   CO2 27 05/13/2020   Lab Results  Component Value Date   ALT 27 05/13/2020   AST 22 05/13/2020   ALKPHOS 78 05/13/2020   BILITOT 0.5 05/13/2020   Lab Results  Component Value Date   HGBA1C 8.7 (H) 05/13/2020   HGBA1C 7.5 (H) 02/04/2020   HGBA1C 8.2 (H) 09/18/2019   HGBA1C 11.8 07/22/2009   HGBA1C >14.0 04/09/2009   No results found for: INSULIN Lab Results  Component Value Date   TSH 0.687 09/18/2019   Lab Results  Component Value Date   CHOL 149 05/13/2020   HDL 43 05/13/2020   LDLCALC 82 05/13/2020   LDLDIRECT 66 04/09/2009   TRIG 136 05/13/2020   CHOLHDL 3.5 05/13/2020   Lab Results  Component Value Date   WBC 6.2 09/18/2019   HGB 15.3 09/18/2019   HCT 47.5 (H) 09/18/2019   MCV 85 09/18/2019   PLT 312 09/18/2019   No results found for: IRON, TIBC, FERRITIN  Obesity Behavioral Intervention:   Approximately 15 minutes were spent on the discussion below.  ASK: We discussed the diagnosis of obesity with [name] today and [name] agreed to give [name] permission to discuss obesity behavioral modification therapy today.  ASSESS: [name] has the diagnosis of obesity and her BMI today is 52.2. [name] is in the action stage of change.   ADVISE: [name] was educated on the multiple health risks of obesity as well as the benefit of weight loss to improve her health. She was advised of the need for long term treatment and the importance of lifestyle modifications to improve her current health and to decrease her risk of future health  problems.  AGREE: Multiple dietary modification options and treatment options were discussed and [name] agreed to follow the recommendations documented in the above note.  ARRANGE: [name] was educated on the importance of frequent visits to treat obesity as outlined per CMS and USPSTF guidelines and agreed to schedule her next follow up appointment today.  Attestation Statements:   Reviewed by clinician on day of visit: allergies, medications, problem list, medical history, surgical history, family history, social history, and previous encounter notes.  Coral Ceo, am acting as Location manager for Mina Marble, NP.  I have reviewed the above documentation for accuracy and completeness, and I agree with the above. -  Kaaren Nass d. Samhita Kretsch, NP-C

## 2020-05-15 DIAGNOSIS — G8929 Other chronic pain: Secondary | ICD-10-CM | POA: Insufficient documentation

## 2020-06-02 ENCOUNTER — Other Ambulatory Visit: Payer: Self-pay

## 2020-06-02 ENCOUNTER — Encounter (INDEPENDENT_AMBULATORY_CARE_PROVIDER_SITE_OTHER): Payer: Self-pay | Admitting: Family Medicine

## 2020-06-02 ENCOUNTER — Ambulatory Visit (INDEPENDENT_AMBULATORY_CARE_PROVIDER_SITE_OTHER): Payer: Medicare HMO | Admitting: Family Medicine

## 2020-06-02 ENCOUNTER — Ambulatory Visit (INDEPENDENT_AMBULATORY_CARE_PROVIDER_SITE_OTHER): Payer: Medicare HMO | Admitting: Clinical

## 2020-06-02 VITALS — BP 105/71 | HR 61 | Temp 98.0°F | Ht 61.0 in | Wt 276.0 lb

## 2020-06-02 DIAGNOSIS — E1159 Type 2 diabetes mellitus with other circulatory complications: Secondary | ICD-10-CM

## 2020-06-02 DIAGNOSIS — Z6841 Body Mass Index (BMI) 40.0 and over, adult: Secondary | ICD-10-CM

## 2020-06-02 DIAGNOSIS — F330 Major depressive disorder, recurrent, mild: Secondary | ICD-10-CM

## 2020-06-02 DIAGNOSIS — F3289 Other specified depressive episodes: Secondary | ICD-10-CM

## 2020-06-02 DIAGNOSIS — E66813 Obesity, class 3: Secondary | ICD-10-CM

## 2020-06-02 DIAGNOSIS — E1169 Type 2 diabetes mellitus with other specified complication: Secondary | ICD-10-CM

## 2020-06-02 DIAGNOSIS — F32A Depression, unspecified: Secondary | ICD-10-CM | POA: Insufficient documentation

## 2020-06-02 DIAGNOSIS — I152 Hypertension secondary to endocrine disorders: Secondary | ICD-10-CM

## 2020-06-02 DIAGNOSIS — E785 Hyperlipidemia, unspecified: Secondary | ICD-10-CM

## 2020-06-02 DIAGNOSIS — Z794 Long term (current) use of insulin: Secondary | ICD-10-CM

## 2020-06-02 MED ORDER — OZEMPIC (1 MG/DOSE) 2 MG/1.5ML ~~LOC~~ SOPN: 1.0000 mg | PEN_INJECTOR | SUBCUTANEOUS | 0 refills | Status: DC

## 2020-06-02 NOTE — Progress Notes (Signed)
   THERAPIST PROGRESS NOTE Virtual Visit via Video Note  I connected with [name] on 06/02/20 at 10:00 AM EDT by a video enabled telemedicine application and verified that I am speaking with the correct person using two identifiers.  Location: Patient: home Provider: office   I discussed the limitations of evaluation and management by telemedicine and the availability of in person appointments. The patient expressed understanding and agreed to proceed.  Follow Up Instructions:  I discussed the assessment and treatment plan with the patient. The patient was provided an opportunity to ask questions and all were answered. The patient agreed with the plan and demonstrated an understanding of the instructions.   The patient was advised to call back or seek an in-person evaluation if the symptoms worsen or if the condition fails to improve as anticipated.   Session Time: 28 minutes  Participation Level: Active  Behavioral Response: CasualAlertDepressed  Type of Therapy: Individual Therapy  Treatment Goals addressed: Diagnosis: Depression  Interventions: CBT  Summary:  [name] is a 60 y.o. female who presents for the scheduled session oriented times five, appropriately dressed, and friendly. Client denied hallucinations and delusions. Client reported on today she has been feeling depressed. Client reported her nephew passed away last June 24, 2022 and the funeral was on yesterday. Client reported she has taken it a little harder than everyone else. Client reported she had not been eating, sleeping and continues to have crying spells. Client reported her primary doctor prescribed her medication to help improve her sleep. Client reported she attended the funeral but did not stay for long. Client reported her sister, mother of the nephew, called her later that day and they had a good conversation. Client reported since the last session she has been walking 30 min per day. Client  reported her sister and/ or daughter will stop by and spend time with her by walking. Client reported she has been walking on her street and going to the park to sit outside to enjoy nature. Client stated, "it's been relaxing". Client engaged with the therapist to acknowledge that going out in public is a significant improvement. Client reported her nephews funeral made her think about her quality of life and wants to make the best of it for herself.     Suicidal/Homicidal: Nowithout intent/plan  Therapist Response: Therapist began the session checking in and asking how she has been doing since last seen. Therapist actively listened to the clients thoughts and feelings.  Therapist engaged with the client to discuss her improvement of thinking and actions. Therapist used CBT to discuss progress towards a positive mindset with help to change actions/ behaviors.  Therapist reinforced the clients changes of modified exercises and enjoying nature to improve her mental and physical well being. Client was scheduled for next appointment.     Plan: Return again in 3 weeks for individual therapy.  Diagnosis: Major depressive disorder, recurrent episode, mild   Keri Tavella Y Callia Swim, LCSW 06/02/2020

## 2020-06-05 ENCOUNTER — Other Ambulatory Visit: Payer: Self-pay

## 2020-06-05 ENCOUNTER — Encounter: Payer: Self-pay | Admitting: Obstetrics and Gynecology

## 2020-06-05 ENCOUNTER — Other Ambulatory Visit (HOSPITAL_COMMUNITY)
Admission: RE | Admit: 2020-06-05 | Discharge: 2020-06-05 | Disposition: A | Discharge status: Home / Self Care | Payer: Medicare HMO | Source: Ambulatory Visit | Attending: Obstetrics and Gynecology | Admitting: Obstetrics and Gynecology

## 2020-06-05 ENCOUNTER — Ambulatory Visit (INDEPENDENT_AMBULATORY_CARE_PROVIDER_SITE_OTHER): Payer: Medicare HMO | Admitting: Obstetrics and Gynecology

## 2020-06-05 VITALS — BP 94/65 | HR 60 | Wt 279.0 lb

## 2020-06-05 DIAGNOSIS — Z01419 Encounter for gynecological examination (general) (routine) without abnormal findings: Secondary | ICD-10-CM | POA: Diagnosis not present

## 2020-06-05 NOTE — Progress Notes (Signed)
Pt is doing well with no complaints today.  Here for routine pap smear.

## 2020-06-05 NOTE — Progress Notes (Signed)
Subjective:     [name] is a 60 y.o. female postmenopausal with BMI 52 who is here for a comprehensive physical exam. The patient reports no problems. Patient denies any pelvic pain, abnormal discharge of postmenopausal vaginal bleeding. Patient is sexually active without complaints. She denies urinary incontinence. Patient is up to date with all her vaccination. She reports a normal colonoscopy within the past 5 years. Patient states her 46 yo grand daughter is undergoing treatment for ovarian cancer. Her family members were encouraged to obtain BRCA testing. Patient desires to have it done today   Past Medical History:  Diagnosis Date  . Acid reflux   . Arthritis   . Back pain   . Depression   . Diabetes mellitus without complication ([place])   . Edema, lower extremity   . Glaucoma   . High cholesterol   . Hypertension   . Joint pain   . Nerve damage   . Shortness of breath   . Vitamin D deficiency    Past Surgical History:  Procedure Laterality Date  . btl    . CHOLECYSTECTOMY  2001   Family History  Problem Relation Age of Onset  . Obesity Mother   . Eating disorder Mother   . Hypertension Father   . Hyperlipidemia Father   . Alcoholism Father      Social History   Socioeconomic History  . Marital status: Single    Spouse name: Not on file  . Number of children: Not on file  . Years of education: Not on file  . Highest education level: Not on file  Occupational History  . Occupation: [employer]  Tobacco Use  . Smoking status: Former Smoker    Packs/day: 1.00    Types: Cigarettes    Quit date: 2001    Years since quitting: 21.2  . Smokeless tobacco: Never Used  Substance and Sexual Activity  . Alcohol use: No  . Drug use: No  . Sexual activity: Not on file  Other Topics Concern  . Not on file  Social History Narrative  . Not on file   Social Determinants of Health   Financial Resource Strain: Not on file  Food Insecurity:  Not on file  Transportation Needs: Not on file  Physical Activity: Not on file  Stress: Not on file  Social Connections: Not on file  Intimate Partner Violence: Not on file   Health Maintenance  Topic Date Due  . Hepatitis C Screening  Never done  . FOOT EXAM  Never done  . OPHTHALMOLOGY EXAM  Never done  . HIV Screening  Never done  . PAP SMEAR-Modifier  06/23/2009  . TETANUS/TDAP  04/04/2018  . COLONOSCOPY (Pts 45-64yr Insurance coverage will need to be confirmed)  05/09/2018  . COVID-19 Vaccine (3 - Booster for Pfizer series) 12/10/2019  . HEMOGLOBIN A1C  11/10/2020  . MAMMOGRAM  04/14/2022  . INFLUENZA VACCINE  Completed  . PNEUMOCOCCAL POLYSACCHARIDE VACCINE AGE 12-64 HIGH RISK  Completed  . HPV VACCINES  Aged Out       Review of Systems Pertinent items noted in HPI and remainder of comprehensive ROS otherwise negative.   Objective:  Blood pressure 94/65, pulse 60, weight 279 lb (126.6 kg).     GENERAL: Well-developed, well-nourished female in no acute distress.  HEENT: Normocephalic, atraumatic. Sclerae anicteric.  NECK: Supple. Normal thyroid.  LUNGS: Clear to auscultation bilaterally.  HEART: Regular rate and rhythm. BREASTS: Symmetric in size. No palpable masses  or lymphadenopathy, skin changes, or nipple drainage. ABDOMEN: Soft, nontender, nondistended. No organomegaly. PELVIC: Normal external female genitalia. Vagina is pale and atrophic.  Normal discharge. Normal appearing cervix. Uterus is normal in size.  No adnexal mass or tenderness. EXTREMITIES: No cyanosis, clubbing, or edema, 2+ distal pulses.    Assessment:    Healthy female exam.      Plan:    Pap smear collected Patient with normal mammogram 03/2020 BRCA testing ordered per patient request Patient will be contacted with abnormal results Patient is actively trying to lose weight and is enrolled in a program. Since the beginning of the year she has lost 8 lb RTC in 1 year or prn See After  Visit Summary for Counseling Recommendations

## 2020-06-05 NOTE — Addendum Note (Signed)
Addended by: Lewie Loron D on: 06/05/2020 01:57 PM   Modules accepted: Orders

## 2020-06-06 LAB — CYTOLOGY - PAP
Adequacy: ABSENT
Comment: NEGATIVE
Diagnosis: NEGATIVE
High risk HPV: NEGATIVE

## 2020-06-09 NOTE — Progress Notes (Signed)
Chief Complaint:   OBESITY [name] is here to discuss her progress with her obesity treatment plan along with follow-up of her obesity related diagnoses.   Today's visit was #: 16 Starting weight: 266 lbs Starting date: 09/18/2019 Today's weight: 276 lbs Today's date: 06/02/2020 Total lbs lost to date: +10 lbs Body mass index is 52.15 kg/m.   Interim History:  [name] has been under increased stress due to a death in the family.  She did more off plan eating and did not eat all foods, especially proteins, due to decreased appetite with increased stress over the past couple of weeks.  Had recent labs done and she is here to review them.  Current Meal Plan: the Category 3 Plan for 60% of the time.  Current Exercise Plan: Walking for 15 minutes 3 times per week.  Assessment/Plan:   1. Type 2 diabetes mellitus with other specified complication, with long-term current use of insulin (HCC) Diabetes Mellitus: Not at goal. Medication: glipizide 10 mg twice daily, metformin 500 mg twice daily, Ozempic 0.5 mg subcutaneously weekly, Lantus 48 units twice daily. Issues reviewed: blood sugar goals, complications of diabetes mellitus, hypoglycemia prevention and treatment, exercise, and nutrition.  FBS 130s-150.  Denies lows or symptoms.  A1c 8.7 on 05/13/2020.  Reviewed with her today.  She is upset that her A1c is worse.    Plan:  Worsening.  Discussed labs with patient today.  Increase Ozempic from 0.5 mg to 1 mg subcutaneously weekly.  New script given.  Goal is to decrease Lantus eventually.  The importance of regular follow up with PCP and all other specialists as scheduled was stressed to patient today.  Lab Results  Component Value Date   HGBA1C 8.7 (H) 05/13/2020   HGBA1C 7.5 (H) 02/04/2020   HGBA1C 8.2 (H) 09/18/2019   Lab Results  Component Value Date   LDLCALC 82 05/13/2020   CREATININE 0.92 05/13/2020   - Increase Semaglutide, 1 MG/DOSE, (OZEMPIC, 1 MG/DOSE,) 2 MG/1.5ML SOPN;  Inject 1 mg into the skin once a week.  Dispense: 9 mL; Refill: 0  2. Hyperlipidemia associated with type 2 diabetes mellitus ([place]) Course: Controlled. Lipid-lowering medications: Lipitor 40 mg daily.   Plan: Discussed labs with patient today.  Dietary changes: Increase soluble fiber, decrease simple carbohydrates, decrease saturated fat. Exercise changes: Moderate to vigorous-intensity aerobic activity 150 minutes per week or as tolerated. We will continue to monitor along with PCP/specialists as it pertains to her weight loss journey.  Lab Results  Component Value Date   CHOL 149 05/13/2020   HDL 43 05/13/2020   LDLCALC 82 05/13/2020   LDLDIRECT 66 04/09/2009   TRIG 136 05/13/2020   CHOLHDL 3.5 05/13/2020   Lab Results  Component Value Date   ALT 27 05/13/2020   AST 22 05/13/2020   ALKPHOS 78 05/13/2020   BILITOT 0.5 05/13/2020   The 10-year ASCVD risk score [name], et al., 2013) is: 10.9%   Values used to calculate the score:     Age: 60 years     Sex: Female     Is Non-Hispanic African American: Yes     Diabetic: Yes     Tobacco smoker: No     Systolic Blood Pressure: 333 mmHg     Is BP treated: Yes     HDL Cholesterol: 43 mg/dL     Total Cholesterol: 149 mg/dL  3. Hypertension associated with type 2 diabetes mellitus ([place]) Not at goal. Medications: metoprolol 50  mg daily, Accuretic 20-25 mg daily.    Plan: Discussed labs with patient today.  Avoid buying foods that are: processed, frozen, or prepackaged to avoid excess salt. We will continue to monitor closely alongside her PCP and/or Specialist.  Regular follow up with PCP and specialists was also encouraged.   BP Readings from Last 3 Encounters:  06/16/20 113/71  06/05/20 94/65  06/02/20 105/71   Lab Results  Component Value Date   CREATININE 0.92 05/13/2020   4. Other depression, with emotional eating Not at goal currently.  Lost family member last week.  Still struggling with sadness.  PCP started her  on a sleep and anti-depressant last week.  Does not recall the name of it.  Plan:  Claramae sees a counselor and will call for an appointment.  She has a follow-up with her PCP for mood management in 3 weeks.  She will let us know the name of the medication she is on at that visit.  Continue walking for 10-15 minutes per day.  5. Class 3 severe obesity with serious comorbidity and body mass index (BMI) of 50.0 to 59.9 in adult, unspecified obesity type ([place])  Course: [name] is currently in the action stage of change. As such, her goal is to continue with weight loss efforts.   Nutrition goals: She has agreed to the Category 3 Plan.   Exercise goals: Walking for 10-15 minutes every other day.  Behavioral modification strategies: increasing lean protein intake, decreasing simple carbohydrates, increasing water intake, meal planning and cooking strategies, keeping healthy foods in the home, emotional eating strategies, avoiding temptations and planning for success.  [name] has agreed to follow-up with our clinic in 2-3 weeks after increase in Ozempic dose today. She was informed of the importance of frequent follow-up visits to maximize her success with intensive lifestyle modifications for her multiple health conditions.   Objective:   Blood pressure 105/71, pulse 61, temperature 98 F (36.7 C), height 5\' 1"  (1.549 m), weight 276 lb (125.2 kg), SpO2 91 %. Body mass index is 52.15 kg/m.  General: Cooperative, alert, well developed, in no acute distress. HEENT: Conjunctivae and lids unremarkable. Cardiovascular: Regular rhythm.  Lungs: Normal work of breathing. Neurologic: No focal deficits.   Lab Results  Component Value Date   CREATININE 0.92 05/13/2020   BUN 19 05/13/2020   NA 142 05/13/2020   K 4.3 05/13/2020   CL 99 05/13/2020   CO2 27 05/13/2020   Lab Results  Component Value Date   ALT 27 05/13/2020   AST 22 05/13/2020   ALKPHOS 78 05/13/2020   BILITOT 0.5 05/13/2020    Lab Results  Component Value Date   HGBA1C 8.7 (H) 05/13/2020   HGBA1C 7.5 (H) 02/04/2020   HGBA1C 8.2 (H) 09/18/2019   HGBA1C 11.8 07/22/2009   HGBA1C >14.0 04/09/2009   Lab Results  Component Value Date   TSH 0.687 09/18/2019   Lab Results  Component Value Date   CHOL 149 05/13/2020   HDL 43 05/13/2020   LDLCALC 82 05/13/2020   LDLDIRECT 66 04/09/2009   TRIG 136 05/13/2020   CHOLHDL 3.5 05/13/2020   Lab Results  Component Value Date   WBC 6.2 09/18/2019   HGB 15.3 09/18/2019   HCT 47.5 (H) 09/18/2019   MCV 85 09/18/2019   PLT 312 09/18/2019   Attestation Statements:   Reviewed by clinician on day of visit: allergies, medications, problem list, medical history, surgical history, family history, social history, and previous encounter notes.  Time spent on visit including pre-visit chart review and post-visit care and charting was >45 minutes.   I, Water quality scientist, CMA, am acting as Location manager for [employer], DO.  I have reviewed the above documentation for accuracy and completeness, and I agree with the above. Marjory Sneddon, D.O.  The [place] was signed into law in 2016 which includes the topic of electronic health records.  This provides immediate access to information in MyChart.  This includes consultation notes, operative notes, office notes, lab results and pathology reports.  If you have any questions about what you read please let us know at your next visit so we can discuss your concerns and take corrective action if need be.  We are right here with you.

## 2020-06-16 ENCOUNTER — Ambulatory Visit (INDEPENDENT_AMBULATORY_CARE_PROVIDER_SITE_OTHER): Payer: Medicare HMO | Admitting: Family Medicine

## 2020-06-16 ENCOUNTER — Encounter (INDEPENDENT_AMBULATORY_CARE_PROVIDER_SITE_OTHER): Payer: Self-pay | Admitting: Family Medicine

## 2020-06-16 ENCOUNTER — Other Ambulatory Visit: Payer: Self-pay

## 2020-06-16 VITALS — BP 113/71 | HR 71 | Temp 98.7°F | Ht 61.0 in | Wt 277.0 lb

## 2020-06-16 DIAGNOSIS — Z794 Long term (current) use of insulin: Secondary | ICD-10-CM

## 2020-06-16 DIAGNOSIS — F39 Unspecified mood [affective] disorder: Secondary | ICD-10-CM | POA: Diagnosis not present

## 2020-06-16 DIAGNOSIS — Z6841 Body Mass Index (BMI) 40.0 and over, adult: Secondary | ICD-10-CM

## 2020-06-16 DIAGNOSIS — E1169 Type 2 diabetes mellitus with other specified complication: Secondary | ICD-10-CM

## 2020-06-17 ENCOUNTER — Ambulatory Visit (HOSPITAL_COMMUNITY): Payer: Self-pay | Admitting: Clinical

## 2020-06-24 LAB — BRCASSURE COMPREHENSIVE PANEL

## 2020-06-24 NOTE — Progress Notes (Signed)
Chief Complaint:   OBESITY [name] is here to discuss her progress with her obesity treatment plan along with follow-up of her obesity related diagnoses.   Today's visit was #: 17 Starting weight: 266 lbs Starting date: 09/18/2019 Today's weight: 277 lbs Today's date: 06/16/2020 Total lbs lost to date: +11 Body mass index is 52.34 kg/m.   Interim History:  Americus is walking for 15 minutes 3 times per day, 3 days per week and is using a cane.  Plan:  She is uncertain why she is not losing with following the plan 100% of the time.  Current Meal Plan: the Category 3 Plan 100% of the time.  Current Exercise Plan: Walking 2 times daily for 30 minutes 3 times per week. Current Anti-Obesity Medications: Ozempic. Side effects: None.  Assessment/Plan:   1. Type 2 diabetes mellitus with other specified complication, with long-term current use of insulin ([place]) At last office visit, we increased Ozempic to 1 mg.  Her blood sugars are 120s-136 max now.  She is tolerating it well, without side effects.  She is very happy with the results.  Plan:  Continue Ozempic at current dose with goal to decrease Lantus with weight loss via prudent nutritional plan.  Continue other diabetes medications per specialists/PCP.  2. Mood disorder ([place]), with emotional eating Increased stress with family deaths recently.  PCP changed her Elavil to hydroxyzine at bedtime.  She says it is helping her with sleep and mood.  Doing much better.  Plan:  Emotional eating has improved a lot with medication change per PCP.  Sees counselor every 2-3 weeks now.  Going well.  Handouts on emotional eating given to her after our discussion on this today.  3. Class 3 severe obesity with serious comorbidity and body mass index (BMI) of 50.0 to 59.9 in adult, unspecified obesity type ([place])  [name] is currently in the action stage of change. As such, her goal is to continue with weight loss efforts.   Nutrition  goals: She has agreed to the Category 3 Plan, but she will also document everything she eats/drinks in M[employer] app or Lose It app.   Exercise goals: As is.  Increase as tolerated.  Behavioral modification strategies: better snacking choices, emotional eating strategies, planning for success and keeping a strict food journal.  Carlisa has agreed to follow-up with our clinic in 2-3 weeks. She was informed of the importance of frequent follow-up visits to maximize her success with intensive lifestyle modifications for her multiple health conditions.   Objective:   Blood pressure 113/71, pulse 71, temperature 98.7 F (37.1 C), height 5\' 1"  (1.549 m), weight 277 lb (125.6 kg), SpO2 96 %. Body mass index is 52.34 kg/m.  General: Cooperative, alert, well developed, in no acute distress. HEENT: Conjunctivae and lids unremarkable. Cardiovascular: Regular rhythm.  Lungs: Normal work of breathing. Neurologic: No focal deficits.   Lab Results  Component Value Date   CREATININE 0.92 05/13/2020   BUN 19 05/13/2020   NA 142 05/13/2020   K 4.3 05/13/2020   CL 99 05/13/2020   CO2 27 05/13/2020   Lab Results  Component Value Date   ALT 27 05/13/2020   AST 22 05/13/2020   ALKPHOS 78 05/13/2020   BILITOT 0.5 05/13/2020   Lab Results  Component Value Date   HGBA1C 8.7 (H) 05/13/2020   HGBA1C 7.5 (H) 02/04/2020   HGBA1C 8.2 (H) 09/18/2019   HGBA1C 11.8 07/22/2009   HGBA1C >14.0 04/09/2009  Lab Results  Component Value Date   TSH 0.687 09/18/2019   Lab Results  Component Value Date   CHOL 149 05/13/2020   HDL 43 05/13/2020   LDLCALC 82 05/13/2020   LDLDIRECT 66 04/09/2009   TRIG 136 05/13/2020   CHOLHDL 3.5 05/13/2020   Lab Results  Component Value Date   WBC 6.2 09/18/2019   HGB 15.3 09/18/2019   HCT 47.5 (H) 09/18/2019   MCV 85 09/18/2019   PLT 312 09/18/2019   Attestation Statements:   Reviewed by clinician on day of visit: allergies, medications, problem list, medical  history, surgical history, family history, social history, and previous encounter notes.  Time spent on visit including pre-visit chart review and post-visit care and charting was 30 minutes.   I, Water quality scientist, CMA, am acting as Location manager for [employer], DO.  I have reviewed the above documentation for accuracy and completeness, and I agree with the above. Marjory Sneddon, D.O.  The [place] was signed into law in 2016 which includes the topic of electronic health records.  This provides immediate access to information in MyChart.  This includes consultation notes, operative notes, office notes, lab results and pathology reports.  If you have any questions about what you read please let us know at your next visit so we can discuss your concerns and take corrective action if need be.  We are right here with you.

## 2020-06-29 ENCOUNTER — Encounter (INDEPENDENT_AMBULATORY_CARE_PROVIDER_SITE_OTHER): Payer: Self-pay

## 2020-06-30 ENCOUNTER — Ambulatory Visit (INDEPENDENT_AMBULATORY_CARE_PROVIDER_SITE_OTHER): Payer: Medicare HMO | Admitting: Family Medicine

## 2020-07-01 ENCOUNTER — Ambulatory Visit (INDEPENDENT_AMBULATORY_CARE_PROVIDER_SITE_OTHER): Payer: Medicare HMO | Admitting: Family Medicine

## 2020-07-04 ENCOUNTER — Other Ambulatory Visit (INDEPENDENT_AMBULATORY_CARE_PROVIDER_SITE_OTHER): Payer: Self-pay | Admitting: Adult Health

## 2020-07-04 DIAGNOSIS — E785 Hyperlipidemia, unspecified: Secondary | ICD-10-CM

## 2020-07-04 DIAGNOSIS — E1169 Type 2 diabetes mellitus with other specified complication: Secondary | ICD-10-CM

## 2020-07-07 NOTE — Telephone Encounter (Signed)
Pt last seen by Dr. Opalski.  

## 2020-07-07 NOTE — Telephone Encounter (Signed)
Mychart message sent.

## 2020-07-21 ENCOUNTER — Other Ambulatory Visit: Payer: Self-pay

## 2020-07-21 ENCOUNTER — Encounter (INDEPENDENT_AMBULATORY_CARE_PROVIDER_SITE_OTHER): Payer: Self-pay | Admitting: Family Medicine

## 2020-07-21 ENCOUNTER — Ambulatory Visit (INDEPENDENT_AMBULATORY_CARE_PROVIDER_SITE_OTHER): Payer: Medicare HMO | Admitting: Family Medicine

## 2020-07-21 VITALS — BP 97/62 | HR 62 | Temp 98.7°F | Ht 61.0 in | Wt 276.0 lb

## 2020-07-21 DIAGNOSIS — I152 Hypertension secondary to endocrine disorders: Secondary | ICD-10-CM | POA: Diagnosis not present

## 2020-07-21 DIAGNOSIS — E1159 Type 2 diabetes mellitus with other circulatory complications: Secondary | ICD-10-CM | POA: Diagnosis not present

## 2020-07-21 DIAGNOSIS — Z6841 Body Mass Index (BMI) 40.0 and over, adult: Secondary | ICD-10-CM

## 2020-07-23 ENCOUNTER — Other Ambulatory Visit (INDEPENDENT_AMBULATORY_CARE_PROVIDER_SITE_OTHER): Payer: Self-pay | Admitting: Adult Health

## 2020-07-23 DIAGNOSIS — E1169 Type 2 diabetes mellitus with other specified complication: Secondary | ICD-10-CM

## 2020-07-23 NOTE — Telephone Encounter (Signed)
Pt last seen by Dr. Opalski.  

## 2020-07-29 NOTE — Progress Notes (Signed)
Chief Complaint:   OBESITY [name] is here to discuss her progress with her obesity treatment plan along with follow-up of her obesity related diagnoses.   Today's visit was #: 18 Starting weight: 266 lbs Starting date: 09/18/2019 Today's weight: 276 lbs Today's date: 07/21/2020 Total lbs lost to date: +10 lbs Body mass index is 52.15 kg/m.   Interim History:  At [name]'s last visit, the plan was changed to Category 3 plus journaling everything.  Today, upon review of Lose It App, she ate mostly the same food items everyday and also got in ~1400-1500 calories and 120+ grams of protein, per her food log.  Current Meal Plan: Continue Category 3 Plan with Lose It to document all foods.  Current Exercise Plan: Walking for 30 minutes 3 times per week. Current Anti-Obesity Medications: Ozempic 1 mg subcutaneously weekly. Side effects: None.  Assessment/Plan:   1. Hypertension associated with type 2 diabetes mellitus ([name]) At goal. Medications: lopressor 50 mg daily, Accuretic 20 mg daily.  Blood pressure at home 110/78, 115/77.  No dizziness or concerns.  Plan:  At goal at home.  No symptoms or concerns.  Continue medications and monitor blood pressure at home.  Avoid buying foods that are: processed, frozen, or prepackaged to avoid excess salt.We will watch for signs of hypotension as she continues lifestyle modifications. We will continue to monitor closely alongside her PCP and/or Specialist.  Regular follow up with PCP and specialists was also encouraged.   BP Readings from Last 3 Encounters:  07/21/20 97/62  06/16/20 113/71  06/05/20 94/65   Lab Results  Component Value Date   CREATININE 0.92 05/13/2020   2. Obesity, current BMI 52.1  Course: [name] is currently in the action stage of change. As such, her goal is to continue with weight loss efforts.   Nutrition goals: She has agreed to Category 3 Plan with Lose It App to track/journal all foods.  Exercise goals: As is  but increase as tolerated.  Behavioral modification strategies: increasing lean protein intake.  [name] has agreed to follow-up with our clinic in 2-3 weeks. She was informed of the importance of frequent follow-up visits to maximize her success with intensive lifestyle modifications for her multiple health conditions.   Objective:   Blood pressure 97/62, pulse 62, temperature 98.7 F (37.1 C), height 5\' 1"  (1.549 m), weight 276 lb (125.2 kg), SpO2 93 %. Body mass index is 52.15 kg/m.  General: Cooperative, alert, well developed, in no acute distress. HEENT: Conjunctivae and lids unremarkable. Cardiovascular: Regular rhythm.  Lungs: Normal work of breathing. Neurologic: No focal deficits.   Lab Results  Component Value Date   CREATININE 0.92 05/13/2020   BUN 19 05/13/2020   NA 142 05/13/2020   K 4.3 05/13/2020   CL 99 05/13/2020   CO2 27 05/13/2020   Lab Results  Component Value Date   ALT 27 05/13/2020   AST 22 05/13/2020   ALKPHOS 78 05/13/2020   BILITOT 0.5 05/13/2020   Lab Results  Component Value Date   HGBA1C 8.7 (H) 05/13/2020   HGBA1C 7.5 (H) 02/04/2020   HGBA1C 8.2 (H) 09/18/2019   HGBA1C 11.8 07/22/2009   HGBA1C >14.0 04/09/2009   Lab Results  Component Value Date   TSH 0.687 09/18/2019   Lab Results  Component Value Date   CHOL 149 05/13/2020   HDL 43 05/13/2020   LDLCALC 82 05/13/2020   LDLDIRECT 66 04/09/2009   TRIG 136 05/13/2020   CHOLHDL 3.5 05/13/2020  Lab Results  Component Value Date   WBC 6.2 09/18/2019   HGB 15.3 09/18/2019   HCT 47.5 (H) 09/18/2019   MCV 85 09/18/2019   PLT 312 09/18/2019   Obesity Behavioral Intervention:   Approximately 15 minutes were spent on the discussion below.  ASK: We discussed the diagnosis of obesity with [name] today and [name] agreed to give [place] permission to discuss obesity behavioral modification therapy today.  ASSESS: [name] has the diagnosis of obesity and her BMI today is 52.1.  [name] is in the action stage of change.   ADVISE: [name] was educated on the multiple health risks of obesity as well as the benefit of weight loss to improve her health. She was Theresa of the need for long term treatment and the importance of lifestyle modifications to improve her current health and to decrease her risk of future health problems.  AGREE: Multiple dietary modification options and treatment options were discussed and [name] agreed to follow the recommendations documented in the above note.  ARRANGE: [name] was educated on the importance of frequent visits to treat obesity as outlined per CMS and USPSTF guidelines and agreed to schedule her next follow up appointment today.  Attestation Statements:   Reviewed by clinician on day of visit: allergies, medications, problem list, medical history, surgical history, family history, social history, and previous encounter notes.  I, Water quality scientist, CMA, am acting as Location manager for [employer], DO.  I have reviewed the above documentation for accuracy and completeness, and I agree with the above. Marjory Sneddon, D.O.  The [place] was signed into law in 2016 which includes the topic of electronic health records.  This provides immediate access to information in MyChart.  This includes consultation notes, operative notes, office notes, lab results and pathology reports.  If you have any questions about what you read please let us know at your next visit so we can discuss your concerns and take corrective action if need be.  We are right here with you.

## 2020-08-02 ENCOUNTER — Other Ambulatory Visit (INDEPENDENT_AMBULATORY_CARE_PROVIDER_SITE_OTHER): Payer: Self-pay | Admitting: Adult Health

## 2020-08-02 DIAGNOSIS — E1169 Type 2 diabetes mellitus with other specified complication: Secondary | ICD-10-CM

## 2020-08-04 NOTE — Telephone Encounter (Signed)
Pt last seen by Dr. Opalski.  

## 2020-08-04 NOTE — Telephone Encounter (Signed)
Mychart message sent to the patient .

## 2020-08-11 ENCOUNTER — Ambulatory Visit (INDEPENDENT_AMBULATORY_CARE_PROVIDER_SITE_OTHER): Payer: Medicare HMO | Admitting: Family Medicine

## 2020-08-11 ENCOUNTER — Encounter (INDEPENDENT_AMBULATORY_CARE_PROVIDER_SITE_OTHER): Payer: Self-pay | Admitting: Family Medicine

## 2020-08-11 ENCOUNTER — Other Ambulatory Visit: Payer: Self-pay

## 2020-08-11 VITALS — BP 107/70 | HR 70 | Temp 98.8°F | Ht 61.0 in | Wt 275.0 lb

## 2020-08-11 DIAGNOSIS — Z794 Long term (current) use of insulin: Secondary | ICD-10-CM

## 2020-08-11 DIAGNOSIS — E1169 Type 2 diabetes mellitus with other specified complication: Secondary | ICD-10-CM

## 2020-08-11 DIAGNOSIS — Z6841 Body Mass Index (BMI) 40.0 and over, adult: Secondary | ICD-10-CM | POA: Diagnosis not present

## 2020-08-11 MED ORDER — OZEMPIC (1 MG/DOSE) 2 MG/1.5ML ~~LOC~~ SOPN: 1.0000 mg | PEN_INJECTOR | SUBCUTANEOUS | 0 refills | Status: DC

## 2020-08-14 NOTE — Progress Notes (Signed)
Chief Complaint:   OBESITY [name] is here to discuss her progress with her obesity treatment plan along with follow-up of her obesity related diagnoses.   Today's visit was #: 33 Starting weight: 266 lbs Starting date: 09/18/2019 Today's weight: 275 lbs Today's date: 08/11/2020 Weight change since last visit: 1 lb Total lbs lost to date: -9 lbs Body mass index is 51.96 kg/m.   Interim History:  [name] says that since May 18, she has eaten the same thing each day for breakfast, lunch , and dinner.   Hitting protein goals of 110+/day.  No concerns.  Loves her [name] bread and vegetables.   Current Meal Plan: the Category 3 Plan with Lose It app showing 1400-1500 calories with 120+ grams of protein for 95% of the time.  Current Exercise Plan: Walking for 30 minutes 3 times per week. Current Anti-Obesity Medications: Ozempic 1 mg subcutaneously weekly. Side effects: None.  Assessment/Plan:   Medications Discontinued During This Encounter  Medication Reason   Semaglutide, 1 MG/DOSE, (OZEMPIC, 1 MG/DOSE,) 2 MG/1.5ML SOPN Reorder   Meds ordered this encounter  Medications   Semaglutide, 1 MG/DOSE, (OZEMPIC, 1 MG/DOSE,) 2 MG/1.5ML SOPN    Sig: Inject 1 mg into the skin once a week.    Dispense:  9 mL    Refill:  0   1. Type 2 diabetes mellitus with other specified complication, with long-term current use of insulin (HCC) Diabetes Mellitus: Not at goal. Medication: Ozempic 1 mg subcutaneously weekly, glipizide 10 mg twice daily, Lantus, Invokana 300 mg daily, metformin 500 mg twice daily. Issues reviewed: blood sugar goals, complications of diabetes mellitus, hypoglycemia prevention and treatment, exercise, and nutrition.  FBS 132 at its highest.  Lowest 95.  At her PCP's office recently, A1c was 7.5 on May 18.  No concerns or complaints.  Plan:  Refill Ozempic today, as per below, 3 month supply, per patient request.  A1c improving.  The patient was encouraged to monitor blood  sugars regularly and to bring their log to the next appointment for review.  The importance of regular follow up with PCP and all other specialists as scheduled was stressed to patient today. The patient will continue to focus on protein-rich, low simple carbohydrate foods. We reviewed the importance of hydration, regular exercise for stress reduction, and restorative sleep.   Lab Results  Component Value Date   HGBA1C 8.7 (H) 05/13/2020   HGBA1C 7.5 (H) 02/04/2020   HGBA1C 8.2 (H) 09/18/2019   Lab Results  Component Value Date   LDLCALC 82 05/13/2020   CREATININE 0.92 05/13/2020   - Refill Semaglutide, 1 MG/DOSE, (OZEMPIC, 1 MG/DOSE,) 2 MG/1.5ML SOPN; Inject 1 mg into the skin once a week.  Dispense: 9 mL; Refill: 0  2. Obesity, current BMI 52.0  Course: [name] is currently in the action stage of change. As such, her goal is to continue with weight loss efforts.   Nutrition goals: She has agreed to keeping a food journal and adhering to recommended goals of 1500 calories and 110+ grams of protein.   Exercise goals: For substantial health benefits, adults should do at least 150 minutes (2 hours and 30 minutes) a week of moderate-intensity, or 75 minutes (1 hour and 15 minutes) a week of vigorous-intensity aerobic physical activity, or an equivalent combination of moderate- and vigorous-intensity aerobic activity. Aerobic activity should be performed in episodes of at least 10 minutes, and preferably, it should be spread throughout the week.  Increase as  tolerated.  Behavioral modification strategies: meal planning and cooking strategies and keeping healthy foods in the home.  [name] has agreed to follow-up with our clinic in 3 weeks. She was informed of the importance of frequent follow-up visits to maximize her success with intensive lifestyle modifications for her multiple health conditions.   Objective:   Blood pressure 107/70, pulse 70, temperature 98.8 F (37.1 C), height 5\' 1"   (1.549 m), weight 275 lb (124.7 kg), SpO2 96 %. Body mass index is 51.96 kg/m.  General: Cooperative, alert, well developed, in no acute distress. HEENT: Conjunctivae and lids unremarkable. Cardiovascular: Regular rhythm.  Lungs: Normal work of breathing. Neurologic: No focal deficits.   Lab Results  Component Value Date   CREATININE 0.92 05/13/2020   BUN 19 05/13/2020   NA 142 05/13/2020   K 4.3 05/13/2020   CL 99 05/13/2020   CO2 27 05/13/2020   Lab Results  Component Value Date   ALT 27 05/13/2020   AST 22 05/13/2020   ALKPHOS 78 05/13/2020   BILITOT 0.5 05/13/2020   Lab Results  Component Value Date   HGBA1C 8.7 (H) 05/13/2020   HGBA1C 7.5 (H) 02/04/2020   HGBA1C 8.2 (H) 09/18/2019   HGBA1C 11.8 07/22/2009   HGBA1C >14.0 04/09/2009   Lab Results  Component Value Date   TSH 0.687 09/18/2019   Lab Results  Component Value Date   CHOL 149 05/13/2020   HDL 43 05/13/2020   LDLCALC 82 05/13/2020   LDLDIRECT 66 04/09/2009   TRIG 136 05/13/2020   CHOLHDL 3.5 05/13/2020   Lab Results  Component Value Date   WBC 6.2 09/18/2019   HGB 15.3 09/18/2019   HCT 47.5 (H) 09/18/2019   MCV 85 09/18/2019   PLT 312 09/18/2019   Obesity Behavioral Intervention:   Approximately 15 minutes were spent on the discussion below.  ASK: We discussed the diagnosis of obesity with [name] today and [name] agreed to give [place] permission to discuss obesity behavioral modification therapy today.  ASSESS: [name] has the diagnosis of obesity and her BMI today is 52.0. [name] is in the action stage of change.   ADVISE: [name] was educated on the multiple health risks of obesity as well as the benefit of weight loss to improve her health. She was advised of the need for long term treatment and the importance of lifestyle modifications to improve her current health and to decrease her risk of future health problems.  AGREE: Multiple dietary modification options and treatment options  were discussed and [name] agreed to follow the recommendations documented in the above note.  ARRANGE: [name] was educated on the importance of frequent visits to treat obesity as outlined per CMS and USPSTF guidelines and agreed to schedule her next follow up appointment today.  Attestation Statements:   Reviewed by clinician on day of visit: allergies, medications, problem list, medical history, surgical history, family history, social history, and previous encounter notes.  I, Water quality scientist, CMA, am acting as Location manager for [employer], DO.  I have reviewed the above documentation for accuracy and completeness, and I agree with the above. [name], D.O.  The [place] was signed into law in 2016 which includes the topic of electronic health records.  This provides immediate access to information in MyChart.  This includes consultation notes, operative notes, office notes, lab results and pathology reports.  If you have any questions about what you read please let us know at your next visit so  we can discuss your concerns and take corrective action if need be.  We are right here with you.

## 2020-09-01 ENCOUNTER — Telehealth (INDEPENDENT_AMBULATORY_CARE_PROVIDER_SITE_OTHER): Payer: Self-pay | Admitting: Adult Health

## 2020-09-01 ENCOUNTER — Other Ambulatory Visit (INDEPENDENT_AMBULATORY_CARE_PROVIDER_SITE_OTHER): Payer: Self-pay | Admitting: Adult Health

## 2020-09-01 ENCOUNTER — Ambulatory Visit (INDEPENDENT_AMBULATORY_CARE_PROVIDER_SITE_OTHER): Payer: Medicare HMO | Admitting: Family Medicine

## 2020-09-01 DIAGNOSIS — E785 Hyperlipidemia, unspecified: Secondary | ICD-10-CM

## 2020-09-01 DIAGNOSIS — E1169 Type 2 diabetes mellitus with other specified complication: Secondary | ICD-10-CM

## 2020-09-01 NOTE — Telephone Encounter (Signed)
Patient is unable to acces mychart and is requesting a refill for  Lipitor. Please call patient, patient is unable to use mychart.

## 2020-09-01 NOTE — Telephone Encounter (Signed)
Dr.Opalski ?

## 2020-09-01 NOTE — Telephone Encounter (Signed)
ERROR

## 2020-09-02 NOTE — Telephone Encounter (Signed)
No showed her appt on 09/01/20. Refill request

## 2020-09-02 NOTE — Telephone Encounter (Signed)
Call to patient.  Explained, per Dr Raliegh Scarlet, that her pcp needs to refill her lipitor.  Pt agreed and will contact pcp for future refills.

## 2020-09-30 ENCOUNTER — Ambulatory Visit (INDEPENDENT_AMBULATORY_CARE_PROVIDER_SITE_OTHER): Payer: Medicare HMO | Admitting: Orthopaedic Surgery

## 2020-09-30 VITALS — Ht 61.0 in | Wt 284.0 lb

## 2020-09-30 DIAGNOSIS — M25562 Pain in left knee: Secondary | ICD-10-CM | POA: Diagnosis not present

## 2020-09-30 DIAGNOSIS — M1712 Unilateral primary osteoarthritis, left knee: Secondary | ICD-10-CM | POA: Diagnosis not present

## 2020-09-30 DIAGNOSIS — G8929 Other chronic pain: Secondary | ICD-10-CM

## 2020-09-30 DIAGNOSIS — M1711 Unilateral primary osteoarthritis, right knee: Secondary | ICD-10-CM

## 2020-09-30 DIAGNOSIS — M25561 Pain in right knee: Secondary | ICD-10-CM

## 2020-09-30 DIAGNOSIS — Z6841 Body Mass Index (BMI) 40.0 and over, adult: Secondary | ICD-10-CM

## 2020-09-30 MED ORDER — LIDOCAINE HCL 1 % IJ SOLN
3.0000 mL | INTRAMUSCULAR | Status: AC | PRN
  Administered 2020-09-30: 3 mL

## 2020-09-30 MED ORDER — METHYLPREDNISOLONE ACETATE 40 MG/ML IJ SUSP
40.0000 mg | INTRAMUSCULAR | Status: AC | PRN
  Administered 2020-09-30: 40 mg via INTRA_ARTICULAR

## 2020-09-30 NOTE — Progress Notes (Signed)
Office Visit Note   Patient: [name]           Date of Birth: [birth year]           MRN: [id number] Visit Date: 09/30/2020              Requested by: Nolene Ebbs, MD [address] [place],  [place] [address] PCP: Nolene Ebbs, MD   Assessment & Plan: Visit Diagnoses:  1. Chronic pain of right knee   2. Chronic pain of left knee   3. Unilateral primary osteoarthritis, left knee   4. Unilateral primary osteoarthritis, right knee   5. BMI 50.0-59.9, adult [facility])     Plan: She still understands that her weight is prohibitive of [place] considering joint replacement surgery at all.  Since its been a year since I placed injections in both knees with a steroid we agreed to try this again today.  That said the main reason she came in as well.  She says the injections do last for a while.  She understands that she we will watch her blood glucose closely close bilateral knee steroid injections can bump her blood glucose.  All question concerns were answered and addressed.  We can see her back in 6 months for repeat injections if needed.  At any visit we see her we need a weight and BMI calculation.  Follow-Up Instructions: Return in about 6 months (around 04/02/2021).   Orders:  Orders Placed This Encounter  Procedures   Large Joint Inj   Large Joint Inj   No orders of the defined types were placed in this encounter.     Procedures: Large Joint Inj: R knee on 09/30/2020 10:07 AM Indications: diagnostic evaluation and pain Details: 22 G 1.5 in needle, superolateral approach  Arthrogram: No  Medications: 3 mL lidocaine 1 %; 40 mg methylPREDNISolone acetate 40 MG/ML Outcome: tolerated well, no immediate complications Procedure, treatment alternatives, risks and benefits explained, specific risks discussed. Consent was given by the patient. Immediately prior to procedure a time out was called to verify the correct patient, procedure, equipment, support staff and site/side marked  as required. Patient was prepped and draped in the usual sterile fashion.    Large Joint Inj: L knee on 09/30/2020 10:08 AM Indications: diagnostic evaluation and pain Details: 22 G 1.5 in needle, superolateral approach  Arthrogram: No  Medications: 3 mL lidocaine 1 %; 40 mg methylPREDNISolone acetate 40 MG/ML Outcome: tolerated well, no immediate complications Procedure, treatment alternatives, risks and benefits explained, specific risks discussed. Consent was given by the patient. Immediately prior to procedure a time out was called to verify the correct patient, procedure, equipment, support staff and site/side marked as required. Patient was prepped and draped in the usual sterile fashion.      Clinical Data: No additional findings.   Subjective: Chief Complaint  Patient presents with   Left Knee - Pain   Right Knee - Pain  The patient has known end-stage arthritis of both knees.  I saw her last about a year ago and provided steroid injections in both her knees.  She is a diabetic and does use insulin.  She says her last hemoglobin A1c was in the low 7 range.  Her knee pain is daily and is detrimentally affecting her mobility and her quality of life.  She is fallen several times since have seen her last.  Her BMI today is 53.66.  It was also 54 a year ago.  She has not  lost any weight.  HPI  Review of Systems   Objective: Vital Signs: Ht 5\' 1"  (1.549 m)   Wt 284 lb (128.8 kg)   BMI 53.66 kg/m   Physical Exam She is alert and oriented x3 and in no acute distress Ortho Exam Examination of both knees show patellofemoral crepitation and global tenderness with both knees with good range of motion. Specialty Comments:  No specialty comments available.  Imaging: No results found.   PMFS History: Patient Active Problem List   Diagnosis Date Noted   Depression 06/02/2020   Other chronic pain 05/15/2020   SOB (shortness of breath) 04/05/2020   Hyperlipidemia  associated with type 2 diabetes mellitus ([place]) 02/04/2020   Vitamin D deficiency 01/16/2020   Healthcare maintenance 01/16/2020   Major depressive disorder, recurrent episode, mild ([place]) 01/12/2020   Hypertension associated with type 2 diabetes mellitus ([place]) 01/02/2020   BMI 50.0-59.9, adult ([place]) 05/21/2019   Chronic pain of left knee 05/21/2019   Chronic pain of right knee 05/21/2019   Unilateral primary osteoarthritis, left knee 05/21/2019   Unilateral primary osteoarthritis, right knee 05/21/2019   [place] 11/12/2009   PERIPHERAL EDEMA 08/01/2009   Pain in joint, lower leg 07/07/2009   SINUS TACHYCARDIA 04/09/2009   BACK PAIN, LUMBOSACRAL, CHRONIC 06/17/2008   SHOULDER PAIN, RIGHT, CHRONIC 05/21/2008   PERIPHERAL NEUROPATHY, MILD 03/05/2008   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   Diabetes mellitus ([place]) 05/19/2006   HYPERCHOLESTEROLEMIA 05/19/2006   OBESITY, NOS 05/19/2006   DEPRESSIVE DISORDER, NOS 05/19/2006   HYPERTENSION, BENIGN SYSTEMIC 05/19/2006   RHINITIS, ALLERGIC 05/19/2006   GASTROESOPHAGEAL REFLUX, NO ESOPHAGITIS 05/19/2006   Past Medical History:  Diagnosis Date   Acid reflux    Arthritis    Back pain    Depression    Diabetes mellitus without complication (HCC)    Edema, lower extremity    Glaucoma    High cholesterol    Hypertension    Joint pain    Nerve damage    Shortness of breath    Vitamin D deficiency     Family History  Problem Relation Age of Onset   Obesity Mother    Eating disorder Mother    Hypertension Father    Hyperlipidemia Father    Alcoholism Father     Past Surgical History:  Procedure Laterality Date   btl     CHOLECYSTECTOMY  2001   Social History   Occupational History   Occupation: [employer]  Tobacco Use   Smoking status: Former    Packs/day: 1.00    Pack years: 0.00    Types: Cigarettes    Quit date: 2001    Years since quitting: 21.5   Smokeless tobacco: Never  Substance and Sexual  Activity   Alcohol use: No   Drug use: No   Sexual activity: Not on file

## 2020-10-01 ENCOUNTER — Telehealth: Payer: Self-pay

## 2020-10-01 NOTE — Telephone Encounter (Signed)
Patient return call. ?

## 2020-10-01 NOTE — Telephone Encounter (Signed)
Called pt to reschedule [facility] - colonoscopy with available providers (Dr. Tarri Glenn and Dr. Candis Schatz) for August. LVM requesting returned call.

## 2020-10-02 ENCOUNTER — Encounter (INDEPENDENT_AMBULATORY_CARE_PROVIDER_SITE_OTHER): Payer: Self-pay | Admitting: Family Medicine

## 2020-10-02 ENCOUNTER — Ambulatory Visit (INDEPENDENT_AMBULATORY_CARE_PROVIDER_SITE_OTHER): Payer: Medicare HMO | Admitting: Family Medicine

## 2020-10-02 ENCOUNTER — Other Ambulatory Visit: Payer: Self-pay

## 2020-10-02 VITALS — BP 145/73 | HR 69 | Temp 98.6°F | Ht 61.0 in | Wt 277.0 lb

## 2020-10-02 DIAGNOSIS — Z794 Long term (current) use of insulin: Secondary | ICD-10-CM | POA: Diagnosis not present

## 2020-10-02 DIAGNOSIS — Z6841 Body Mass Index (BMI) 40.0 and over, adult: Secondary | ICD-10-CM | POA: Diagnosis not present

## 2020-10-02 DIAGNOSIS — E1169 Type 2 diabetes mellitus with other specified complication: Secondary | ICD-10-CM | POA: Diagnosis not present

## 2020-10-02 NOTE — Telephone Encounter (Signed)
Returned pt call. LVM requesting returned call. 

## 2020-10-15 NOTE — Progress Notes (Signed)
Chief Complaint:   OBESITY [name] is here to discuss her progress with her obesity treatment plan along with follow-up of her obesity related diagnoses.   Today's visit was #: 20 Starting weight: 266 lbs Starting date: 09/18/2019 Today's weight: 277 lbs Today's date: 10/02/2020 Weight change since last visit: +2 lbs Total lbs lost to date: +11 pounds Body mass index is 52.34 kg/m.   Interim History:  It has been 6 weeks since [name]'s last office visit.  She was lost to follow-up.  Her sister was ill and she has been unable to focus on herself.  Plan:  Bring in log of date, calories, and total grams of protein each day.  Current Meal Plan: keeping a food journal and adhering to recommended goals of 1500 calories and 110 grams of protein for 90% of the time.  Current Exercise Plan: None. Current Anti-Obesity Medications: Ozempic 1 mg subcutaneously weekly. Side effects: None.  Assessment/Plan:   1. Type 2 diabetes mellitus with other specified complication, with long-term current use of insulin (HCC) Diabetes Mellitus: Not at goal. Medication: glipizide 10 mg twice daily, metformin 500 mg twice daily, Ozempic 1 mg subcutaneously weekly, Invokana 300 mg daily, Lantus 48 units twice daily. Issues reviewed: blood sugar goals, complications of diabetes mellitus, hypoglycemia prevention and treatment, exercise, and nutrition.  Blood sugars have been stable and well controlled.  Denies lows.  Tolerating Ozempic well.  Helping with hunger cravings.  She declines increase in dose today.  Plan:  No need for refill of Ozempic (90 day supply given in the past).  Continue prudent nutritional plan and weight loss. The importance of regular follow up with PCP and all other specialists as scheduled was stressed to patient today. The patient will continue to focus on protein-rich, low simple carbohydrate foods. We reviewed the importance of hydration, regular exercise for stress reduction, and  restorative sleep.   Lab Results  Component Value Date   HGBA1C 8.7 (H) 05/13/2020   HGBA1C 7.5 (H) 02/04/2020   HGBA1C 8.2 (H) 09/18/2019   Lab Results  Component Value Date   LDLCALC 82 05/13/2020   CREATININE 0.92 05/13/2020   2. Class 3 severe obesity with serious comorbidity and body mass index (BMI) of 50.0 to 59.9 in adult, unspecified obesity type ([place])  Course: [name] is currently in the action stage of change. As such, her goal is to continue with weight loss efforts.   Nutrition goals: She has agreed to keeping a food journal and adhering to recommended goals of 1450-1550 calories and 110+ grams of protein.   Exercise goals: All adults should avoid inactivity. Some physical activity is better than none, and adults who participate in any amount of physical activity gain some health benefits.  Behavioral modification strategies: keeping a strict food journal.  [name] has agreed to follow-up with our clinic in 3 weeks. She was informed of the importance of frequent follow-up visits to maximize her success with intensive lifestyle modifications for her multiple health conditions.   Objective:   Blood pressure (!) 145/73, pulse 69, temperature 98.6 F (37 C), height '5\' 1"'$  (1.549 m), weight 277 lb (125.6 kg), SpO2 99 %. Body mass index is 52.34 kg/m.  General: Cooperative, alert, well developed, in no acute distress. HEENT: Conjunctivae and lids unremarkable. Cardiovascular: Regular rhythm.  Lungs: Normal work of breathing. Neurologic: No focal deficits.   Lab Results  Component Value Date   CREATININE 0.92 05/13/2020   BUN 19 05/13/2020   NA  142 05/13/2020   K 4.3 05/13/2020   CL 99 05/13/2020   CO2 27 05/13/2020   Lab Results  Component Value Date   ALT 27 05/13/2020   AST 22 05/13/2020   ALKPHOS 78 05/13/2020   BILITOT 0.5 05/13/2020   Lab Results  Component Value Date   HGBA1C 8.7 (H) 05/13/2020   HGBA1C 7.5 (H) 02/04/2020   HGBA1C 8.2 (H)  09/18/2019   HGBA1C 11.8 07/22/2009   HGBA1C >14.0 04/09/2009   Lab Results  Component Value Date   TSH 0.687 09/18/2019   Lab Results  Component Value Date   CHOL 149 05/13/2020   HDL 43 05/13/2020   LDLCALC 82 05/13/2020   LDLDIRECT 66 04/09/2009   TRIG 136 05/13/2020   CHOLHDL 3.5 05/13/2020   Lab Results  Component Value Date   VD25OH 49.3 02/04/2020   VD25OH 30.3 09/18/2019   Lab Results  Component Value Date   WBC 6.2 09/18/2019   HGB 15.3 09/18/2019   HCT 47.5 (H) 09/18/2019   MCV 85 09/18/2019   PLT 312 09/18/2019   Obesity Behavioral Intervention:   Approximately 15 minutes were spent on the discussion below.  ASK: We discussed the diagnosis of obesity with [name] today and [name] agreed to give [place] permission to discuss obesity behavioral modification therapy today.  ASSESS: [name] has the diagnosis of obesity and her BMI today is 52.3. [name] is in the action stage of change.   ADVISE: [name] was educated on the multiple health risks of obesity as well as the benefit of weight loss to improve her health. She was advised of the need for long term treatment and the importance of lifestyle modifications to improve her current health and to decrease her risk of future health problems.  AGREE: Multiple dietary modification options and treatment options were discussed and [name] agreed to follow the recommendations documented in the above note.  ARRANGE: [name] was educated on the importance of frequent visits to treat obesity as outlined per CMS and USPSTF guidelines and agreed to schedule her next follow up appointment today.  Attestation Statements:   Reviewed by clinician on day of visit: allergies, medications, problem list, medical history, surgical history, family history, social history, and previous encounter notes.  I, Water quality scientist, CMA, am acting as Location manager for [employer], DO.  I have reviewed the above documentation for  accuracy and completeness, and I agree with the above. [name], D.O.  [employer] [place] was signed into law in 2016 which includes the topic of electronic health records.  This provides immediate access to information in MyChart.  This includes consultation notes, operative notes, office notes, lab results and pathology reports.  If you have any questions about what you read please let us know at your next visit so we can discuss your concerns and take corrective action if need be.  We are right here with you.

## 2020-10-23 ENCOUNTER — Encounter (INDEPENDENT_AMBULATORY_CARE_PROVIDER_SITE_OTHER): Payer: Self-pay

## 2020-10-23 ENCOUNTER — Ambulatory Visit (INDEPENDENT_AMBULATORY_CARE_PROVIDER_SITE_OTHER): Payer: Medicare HMO | Admitting: Family Medicine

## 2020-10-23 ENCOUNTER — Other Ambulatory Visit: Payer: Self-pay

## 2020-10-23 ENCOUNTER — Encounter (INDEPENDENT_AMBULATORY_CARE_PROVIDER_SITE_OTHER): Payer: Self-pay | Admitting: Family Medicine

## 2020-10-23 VITALS — BP 120/72 | HR 65 | Temp 98.6°F | Ht 61.0 in | Wt 270.0 lb

## 2020-10-23 DIAGNOSIS — E1159 Type 2 diabetes mellitus with other circulatory complications: Secondary | ICD-10-CM

## 2020-10-23 DIAGNOSIS — Z794 Long term (current) use of insulin: Secondary | ICD-10-CM

## 2020-10-23 DIAGNOSIS — E559 Vitamin D deficiency, unspecified: Secondary | ICD-10-CM | POA: Diagnosis not present

## 2020-10-23 DIAGNOSIS — E1169 Type 2 diabetes mellitus with other specified complication: Secondary | ICD-10-CM | POA: Diagnosis not present

## 2020-10-23 DIAGNOSIS — I152 Hypertension secondary to endocrine disorders: Secondary | ICD-10-CM

## 2020-10-23 DIAGNOSIS — Z6841 Body Mass Index (BMI) 40.0 and over, adult: Secondary | ICD-10-CM

## 2020-10-23 MED ORDER — VITAMIN D (ERGOCALCIFEROL) 1.25 MG (50000 UNIT) PO CAPS: ORAL_CAPSULE | ORAL | 0 refills | Status: DC

## 2020-10-23 MED ORDER — OZEMPIC (1 MG/DOSE) 2 MG/1.5ML ~~LOC~~ SOPN: 1.0000 mg | PEN_INJECTOR | SUBCUTANEOUS | 0 refills | Status: DC

## 2020-10-27 LAB — CBC AND DIFFERENTIAL
HCT: 44 (ref 36–46)
Hemoglobin: 13.9 (ref 12.0–16.0)
Platelets: 283 (ref 150–399)
WBC: 6.4

## 2020-10-27 LAB — COMPREHENSIVE METABOLIC PANEL
Albumin: 7 — AB (ref 3.5–5.0)
Calcium: 10 (ref 8.7–10.7)
GFR calc Af Amer: 77
GFR calc non Af Amer: 66

## 2020-10-27 LAB — LIPID PANEL
Cholesterol: 168 (ref 0–200)
HDL: 53 (ref 35–70)
LDL Cholesterol: 77
LDl/HDL Ratio: 1.5
Triglycerides: 188 — AB (ref 40–160)

## 2020-10-27 LAB — HEPATIC FUNCTION PANEL
ALT: 14 (ref 7–35)
AST: 14 (ref 13–35)
Alkaline Phosphatase: 84 (ref 25–125)
Bilirubin, Total: 0.4

## 2020-10-27 LAB — BASIC METABOLIC PANEL
BUN: 30 — AB (ref 4–21)
CO2: 34 — AB (ref 13–22)
Chloride: 100 (ref 99–108)
Creatinine: 0.9 (ref 0.5–1.1)
Glucose: 89
Potassium: 4.3 (ref 3.4–5.3)
Sodium: 143 (ref 137–147)

## 2020-10-27 LAB — VITAMIN D 25 HYDROXY (VIT D DEFICIENCY, FRACTURES): Vit D, 25-Hydroxy: 36.2

## 2020-10-27 LAB — TSH: TSH: 1.32 (ref 0.41–5.90)

## 2020-10-27 LAB — CBC: RBC: 5.17 — AB (ref 3.87–5.11)

## 2020-10-27 NOTE — Progress Notes (Signed)
Chief Complaint:   OBESITY [name] is here to discuss her progress with her obesity treatment plan along with follow-up of her obesity related diagnoses. [name] is on keeping a food journal and adhering to recommended goals of 1450-1550 calories and 110+ grams of protein daily and states she is following her eating plan approximately 90% of the time. [name] states she is walking for 30 minutes 3 times per week.  Today's visit was #: 21 Starting weight: 266 lbs Starting date: 09/18/2019 Today's weight: 270 lbs Today's date: 10/23/2020 Total lbs lost to date: 0 Total lbs lost since last in-office visit: 7  Interim History: Coley is eating healthier, eating more green veggies and increasing lean meats. She is drinking more water. She states "by her eating leafy greens and lean meat, she didn't want anything else".  Subjective:   1. Type 2 diabetes mellitus with other specified complication, with long-term current use of insulin (HCC) Medications reviewed. Diabetic ROS: no polyuria or polydipsia, no chest pain, dyspnea or TIA's, no numbness, tingling or pain in extremities, no hypoglycemia, no medication side effects noted.   2. Vitamin D deficiency [name] has not been taking Vit D for 1 week now. She needs a refill.  3. Hypertension associated with diabetes ([place]) [name] blood pressure is at goal today. Cardiovascular ROS: no chest pain or dyspnea on exertion.  Assessment/Plan:  No orders of the defined types were placed in this encounter.   Medications Discontinued During This Encounter  Medication Reason   Semaglutide, 1 MG/DOSE, (OZEMPIC, 1 MG/DOSE,) 2 MG/1.5ML SOPN Reorder     Meds ordered this encounter  Medications   Semaglutide, 1 MG/DOSE, (OZEMPIC, 1 MG/DOSE,) 2 MG/1.5ML SOPN    Sig: Inject 1 mg into the skin once a week.    Dispense:  9 mL    Refill:  0   Vitamin D, Ergocalciferol, (DRISDOL) 1.25 MG (50000 UNIT) CAPS capsule    Sig: 1 po q wed and 1 po q  sun    Dispense:  8 capsule    Refill:  0    No RF until OV     1. Type 2 diabetes mellitus with other specified complication, with long-term current use of insulin (HCC) [name] will continue her medications, and we will refill Ozempic for 1 month. Good blood sugar control is important to decrease the likelihood of diabetic complications such as nephropathy, neuropathy, limb loss, blindness, coronary artery disease, and death. Intensive lifestyle modification including diet, exercise and weight loss are the first line of treatment for diabetes.   - Semaglutide, 1 MG/DOSE, (OZEMPIC, 1 MG/DOSE,) 2 MG/1.5ML SOPN; Inject 1 mg into the skin once a week.  Dispense: 9 mL; Refill: 0  2. Vitamin D deficiency Low Vitamin D level contributes to fatigue and are associated with obesity, breast, and colon cancer. We will refill prescription Vitamin D for 1 month. [name] will follow-up for routine testing of Vitamin D, at least 2-3 times per year to avoid over-replacement.  - Vitamin D, Ergocalciferol, (DRISDOL) 1.25 MG (50000 UNIT) CAPS capsule; 1 po q wed and 1 po q sun  Dispense: 8 capsule; Refill: 0  3. Hypertension associated with diabetes ([place]) Plan: - BP is at goal today.   - Counseled [name] on pathophysiology of disease and discussed treatment plan, which always includes dietary and lifestyle modification as first line.   - Lifestyle changes such as following our low salt, heart healthy meal plan and engaging in a regular  exercise program discussed   - Avoid buying foods that are: processed, frozen, or prepackaged to avoid excess salt.  - Ambulatory blood pressure monitoring encouraged.  Reminded patient that if they ever feel poorly in any way, to check their blood pressure and pulse as well.  - We will continue to monitor closely alongside PCP/ specialists.  Pt reminded to also f/up with those individuals as instructed by them.   - We will continue to monitor symptoms as they  relate to the her weight loss journey.  4. Obesity with current BMI of 51.2 [name] is currently in the action stage of change. As such, her goal is to continue with weight loss efforts. She has agreed to keeping a food journal and adhering to recommended goals of 1450-1550 calories and 110+ grams of protein daily.   [name] had labs done at her primary care physician's office 5 weeks ago with A1c, etc, and she returns next week. She will bring [place] all of her labs done in the past 6 months.  Exercise goals: As is, but add as tolerated.  Behavioral modification strategies: increasing lean protein intake, decreasing simple carbohydrates, avoiding temptations, and planning for success.  [name] has agreed to follow-up with our clinic in 2 to 3 weeks. She was informed of the importance of frequent follow-up visits to maximize her success with intensive lifestyle modifications for her multiple health conditions.   Objective:   Blood pressure 120/72, pulse 65, temperature 98.6 F (37 C), height '5\' 1"'$  (1.549 m), weight 270 lb (122.5 kg), SpO2 97 %. Body mass index is 51.02 kg/m.  General: Cooperative, alert, well developed, in no acute distress. HEENT: Conjunctivae and lids unremarkable. Cardiovascular: Regular rhythm.  Lungs: Normal work of breathing. Neurologic: No focal deficits.   Lab Results  Component Value Date   CREATININE 0.92 05/13/2020   BUN 19 05/13/2020   NA 142 05/13/2020   K 4.3 05/13/2020   CL 99 05/13/2020   CO2 27 05/13/2020   Lab Results  Component Value Date   ALT 27 05/13/2020   AST 22 05/13/2020   ALKPHOS 78 05/13/2020   BILITOT 0.5 05/13/2020   Lab Results  Component Value Date   HGBA1C 8.7 (H) 05/13/2020   HGBA1C 7.5 (H) 02/04/2020   HGBA1C 8.2 (H) 09/18/2019   HGBA1C 11.8 07/22/2009   HGBA1C >14.0 04/09/2009   No results found for: INSULIN Lab Results  Component Value Date   TSH 1.00 11/29/2019   Lab Results  Component Value Date   CHOL 149  05/13/2020   HDL 43 05/13/2020   LDLCALC 82 05/13/2020   LDLDIRECT 66 04/09/2009   TRIG 136 05/13/2020   CHOLHDL 3.5 05/13/2020   Lab Results  Component Value Date   VD25OH 49.3 02/04/2020   VD25OH 107 11/29/2019   VD25OH 30.3 09/18/2019   Lab Results  Component Value Date   WBC 6.5 11/29/2019   HGB 14.5 11/29/2019   HCT 47 (A) 11/29/2019   MCV 85 09/18/2019   PLT 349 11/29/2019   No results found for: IRON, TIBC, FERRITIN  Obesity Behavioral Intervention:   Approximately 15 minutes were spent on the discussion below.  ASK: We discussed the diagnosis of obesity with [name] today and [name] agreed to give [place] permission to discuss obesity behavioral modification therapy today.  ASSESS: [name] has the diagnosis of obesity and her BMI today is 51.4. [name] is in the action stage of change.   ADVISE: [name] was educated on the multiple health risks  of obesity as well as the benefit of weight loss to improve her health. She was advised of the need for long term treatment and the importance of lifestyle modifications to improve her current health and to decrease her risk of future health problems.  AGREE: Multiple dietary modification options and treatment options were discussed and [name] agreed to follow the recommendations documented in the above note.  ARRANGE: [name] was educated on the importance of frequent visits to treat obesity as outlined per CMS and USPSTF guidelines and agreed to schedule her next follow up appointment today.  Attestation Statements:   Reviewed by clinician on day of visit: allergies, medications, problem list, medical history, surgical history, family history, social history, and previous encounter notes.   Wilhemena Durie, am acting as transcriptionist for [employer], DO.  I have reviewed the above documentation for accuracy and completeness, and I agree with the above. Marjory Sneddon, D.O.  [employer] [place] was  signed into law in 2016 which includes the topic of electronic health records.  This provides immediate access to information in MyChart.  This includes consultation notes, operative notes, office notes, lab results and pathology reports.  If you have any questions about what you read please let us know at your next visit so we can discuss your concerns and take corrective action if need be.  We are right here with you.

## 2020-11-12 ENCOUNTER — Other Ambulatory Visit: Payer: Self-pay | Admitting: Internal Medicine

## 2020-11-12 DIAGNOSIS — Z1231 Encounter for screening mammogram for malignant neoplasm of breast: Secondary | ICD-10-CM

## 2020-11-17 ENCOUNTER — Other Ambulatory Visit: Payer: Self-pay

## 2020-11-17 ENCOUNTER — Ambulatory Visit (INDEPENDENT_AMBULATORY_CARE_PROVIDER_SITE_OTHER): Payer: Medicare HMO | Admitting: Family Medicine

## 2020-11-17 ENCOUNTER — Encounter (INDEPENDENT_AMBULATORY_CARE_PROVIDER_SITE_OTHER): Payer: Self-pay | Admitting: Family Medicine

## 2020-11-17 VITALS — BP 144/85 | HR 68 | Temp 98.4°F | Ht 61.0 in | Wt 277.0 lb

## 2020-11-17 DIAGNOSIS — Z6841 Body Mass Index (BMI) 40.0 and over, adult: Secondary | ICD-10-CM

## 2020-11-17 DIAGNOSIS — Z794 Long term (current) use of insulin: Secondary | ICD-10-CM | POA: Diagnosis not present

## 2020-11-17 DIAGNOSIS — Z1211 Encounter for screening for malignant neoplasm of colon: Secondary | ICD-10-CM

## 2020-11-17 DIAGNOSIS — E559 Vitamin D deficiency, unspecified: Secondary | ICD-10-CM

## 2020-11-17 DIAGNOSIS — E1169 Type 2 diabetes mellitus with other specified complication: Secondary | ICD-10-CM | POA: Diagnosis not present

## 2020-11-17 MED ORDER — VITAMIN D (ERGOCALCIFEROL) 1.25 MG (50000 UNIT) PO CAPS: ORAL_CAPSULE | ORAL | 0 refills | Status: DC

## 2020-11-18 NOTE — Progress Notes (Signed)
Chief Complaint:   OBESITY [name] is here to discuss her progress with her obesity treatment plan along with follow-up of her obesity related diagnoses. [name] is on keeping a food journal and adhering to recommended goals of 1450-1550 calories and 110+ grams of protein daily and states she is following her eating plan approximately 85% of the time. [name] states she is doing 0 minutes 0 times per week.  Today's visit was #: 22 Starting weight: 266 lbs Starting date: 09/18/2019 Today's weight: 277 lbs Today's date: 11/17/2020 Total lbs lost to date: 0 Total lbs lost since last in-office visit: 0  Interim History: [name] feels she is in a depressed mood due to stressors in her life. 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 She is not following the meal plan at all, and she is skipping meals. She did not bring in a copy of all of her labs as requested at her last office visit. She will do so for her next office visit. Per the patient, she will go by her primary care physician's office today and bring [place] a copy later on of the last 6 months of labs.  Subjective:   1. Type 2 diabetes mellitus with other specified complication, with long-term current use of insulin (HCC) [name] is tolerating Ozempic well, and she denies the need for a refill. Her fasting BGs range in the 120's, lowest at 119 and highest at 130. She has no complaints.  2. Vitamin D deficiency [name] is tolerating medication(s) well without side effects.  Medication compliance is good and patient appears to be taking it as prescribed.  Denies additional concerns regarding this condition.   Assessment/Plan:  No orders of the defined types were placed in this encounter.   Medications Discontinued During This Encounter  Medication Reason   Vitamin D, Ergocalciferol, (DRISDOL) 1.25 MG (50000 UNIT) CAPS capsule Reorder     Meds ordered this encounter  Medications    Vitamin D, Ergocalciferol, (DRISDOL) 1.25 MG (50000 UNIT) CAPS capsule    Sig: 1 po q wed and 1 po q sun    Dispense:  8 capsule    Refill:  0    No RF until OV     1. Type 2 diabetes mellitus with other specified complication, with long-term current use of insulin ([place]) Sugar will continue Ozempic at 1 mg weekly, follow her meal plan, decrease simple carbohydrates, and she is to not skips meals.  - Counseled patient on pathophysiology of disease and discussed good blood sugar control is important to decrease the likelihood of diabetic complications such as nephropathy, neuropathy, limb loss, blindness, coronary artery disease, and death.  Intensive lifestyle modification including diet, exercise and weight loss are the first line of treatment for diabetes.    - Continue home blood sugar monitoring regularly - closely- especially with continued wt loss.  Reviewed blood sugar goals.  Reminded if pt feels poorly- check BS and BP at that time.  Bring in BS and BP logs each OV. - Eat on a regular basis- no skipping or going long periods without eating.  We discussed hypoglycemia prevention. - Any concerns about medicines should be directed at the prescribing provider - Recheck labs in 3 months if not done at Endo/ PCP.  - Importance of f/up with PCP and all other specialists as scheduled was stressed to pt today - Pt should contact their endocrinologist or PCP as they  see fit with any Q's/ concerns with what we discuss.   - It is recommended for pt to continue with current plan and medication doses at this time  ( please adjust verbage if I change treatment- thnx!)   2. Vitamin D deficiency [name] will continue prescription Vit D, and we will refill for 1 month.  - Discussed importance of vitamin D to their health and well-being.  - possible symptoms of low Vitamin D can be low energy, depressed mood, muscle aches, joint aches, osteoporosis etc. - low Vitamin D levels may be linked to an  increased risk of cardiovascular events and even increased risk of cancers- such as colon and breast.  - I recommend pt take a 50,000 IU weekly prescription vit D - see script below   - Informed patient this may be a lifelong thing, and she was encouraged to continue to take the medicine until told otherwise.   - we will need to monitor levels regularly (every 3-4 mo on average) to keep levels within normal limits.  - weight loss will likely improve availability of vitamin D, thus encouraged [name] to continue with meal plan and their weight loss efforts to further improve this condition - pt's questions and concerns regarding this condition addressed.  - Vitamin D, Ergocalciferol, (DRISDOL) 1.25 MG (50000 UNIT) CAPS capsule; 1 po q wed and 1 po q sun  Dispense: 8 capsule; Refill: 0  3. Obesity with current BMI of 52.4 [name] is currently in the action stage of change. As such, her goal is to continue with weight loss efforts. She has agreed to change to the Category 2 Plan, as journaling is too difficult. Handout was given today.  Exercise goals: For substantial health benefits, adults should do at least 150 minutes (2 hours and 30 minutes) a week of moderate-intensity, or 75 minutes (1 hour and 15 minutes) a week of vigorous-intensity aerobic physical activity, or an equivalent combination of moderate- and vigorous-intensity aerobic activity. Aerobic activity should be performed in episodes of at least 10 minutes, and preferably, it should be spread throughout the week.  Behavioral modification strategies: increasing lean protein intake, decreasing simple carbohydrates, and planning for success.  [name] has agreed to follow-up with our clinic in 3 to 4 weeks. She was informed of the importance of frequent follow-up visits to maximize her success with intensive lifestyle modifications for her multiple health conditions.   Objective:   Blood pressure (!) 144/85, pulse 68, temperature 98.4 F  (36.9 C), height '5\' 1"'$  (1.549 m), weight 277 lb (125.6 kg), SpO2 95 %. Body mass index is 52.34 kg/m.  General: Cooperative, alert, well developed, in no acute distress. HEENT: Conjunctivae and lids unremarkable. Cardiovascular: Regular rhythm.  Lungs: Normal work of breathing. Neurologic: No focal deficits.   Lab Results  Component Value Date   CREATININE 0.92 05/13/2020   BUN 19 05/13/2020   NA 142 05/13/2020   K 4.3 05/13/2020   CL 99 05/13/2020   CO2 27 05/13/2020   Lab Results  Component Value Date   ALT 27 05/13/2020   AST 22 05/13/2020   ALKPHOS 78 05/13/2020   BILITOT 0.5 05/13/2020   Lab Results  Component Value Date   HGBA1C 8.7 (H) 05/13/2020   HGBA1C 7.5 (H) 02/04/2020   HGBA1C 8.2 (H) 09/18/2019   HGBA1C 11.8 07/22/2009   HGBA1C >14.0 04/09/2009   No results found for: INSULIN Lab Results  Component Value Date   TSH 1.00 11/29/2019   Lab Results  Component Value Date   CHOL 149 05/13/2020   HDL 43 05/13/2020   LDLCALC 82 05/13/2020   LDLDIRECT 66 04/09/2009   TRIG 136 05/13/2020   CHOLHDL 3.5 05/13/2020   Lab Results  Component Value Date   VD25OH 49.3 02/04/2020   VD25OH 107 11/29/2019   VD25OH 30.3 09/18/2019   Lab Results  Component Value Date   WBC 6.5 11/29/2019   HGB 14.5 11/29/2019   HCT 47 (A) 11/29/2019   MCV 85 09/18/2019   PLT 349 11/29/2019   No results found for: IRON, TIBC, FERRITIN  Obesity Behavioral Intervention:   Approximately 15 minutes were spent on the discussion below.  ASK: We discussed the diagnosis of obesity with [name] today and [name] agreed to give [place] permission to discuss obesity behavioral modification therapy today.  ASSESS: [name] has the diagnosis of obesity and her BMI today is 52.4. [name] is in the action stage of change.   ADVISE: Ajahnae was educated on the multiple health risks of obesity as well as the benefit of weight loss to improve her health. She was advised of the need for long  term treatment and the importance of lifestyle modifications to improve her current health and to decrease her risk of future health problems.  AGREE: Multiple dietary modification options and treatment options were discussed and Klaire agreed to follow the recommendations documented in the above note.  ARRANGE: Sarin was educated on the importance of frequent visits to treat obesity as outlined per CMS and USPSTF guidelines and agreed to schedule her next follow up appointment today.  Attestation Statements:   Reviewed by clinician on day of visit: allergies, medications, problem list, medical history, surgical history, family history, social history, and previous encounter notes.   [name], am acting as transcriptionist for [employer], DO.  I have reviewed the above documentation for accuracy and completeness, and I agree with the above. [name], D.O.  The [place] was signed into law in 2016 which includes the topic of electronic health records.  This provides immediate access to information in MyChart.  This includes consultation notes, operative notes, office notes, lab results and pathology reports.  If you have any questions about what you read please let us know at your next visit so we can discuss your concerns and take corrective action if need be.  We are right here with you.

## 2020-12-11 ENCOUNTER — Other Ambulatory Visit: Payer: Self-pay

## 2020-12-11 ENCOUNTER — Ambulatory Visit (INDEPENDENT_AMBULATORY_CARE_PROVIDER_SITE_OTHER): Payer: Medicare HMO | Admitting: Family Medicine

## 2020-12-11 ENCOUNTER — Encounter (INDEPENDENT_AMBULATORY_CARE_PROVIDER_SITE_OTHER): Payer: Self-pay | Admitting: Family Medicine

## 2020-12-11 ENCOUNTER — Telehealth (INDEPENDENT_AMBULATORY_CARE_PROVIDER_SITE_OTHER): Payer: Self-pay

## 2020-12-11 VITALS — BP 146/65 | HR 62 | Temp 98.7°F | Ht 61.0 in | Wt 275.0 lb

## 2020-12-11 DIAGNOSIS — Z6841 Body Mass Index (BMI) 40.0 and over, adult: Secondary | ICD-10-CM | POA: Diagnosis not present

## 2020-12-11 DIAGNOSIS — E1165 Type 2 diabetes mellitus with hyperglycemia: Secondary | ICD-10-CM | POA: Diagnosis not present

## 2020-12-11 DIAGNOSIS — E785 Hyperlipidemia, unspecified: Secondary | ICD-10-CM

## 2020-12-11 DIAGNOSIS — Z794 Long term (current) use of insulin: Secondary | ICD-10-CM

## 2020-12-11 DIAGNOSIS — E1169 Type 2 diabetes mellitus with other specified complication: Secondary | ICD-10-CM

## 2020-12-11 DIAGNOSIS — E559 Vitamin D deficiency, unspecified: Secondary | ICD-10-CM | POA: Diagnosis not present

## 2020-12-11 MED ORDER — ATORVASTATIN CALCIUM 40 MG PO TABS: 40.0000 mg | ORAL_TABLET | Freq: Every day | ORAL | 0 refills | Status: DC

## 2020-12-11 NOTE — Telephone Encounter (Signed)
Call to Dr Jeanie Cooks office to get most recent lab results.  Rise Paganini, the receptionist forwarded me to a voicemail and I left a message to fax records to [place].

## 2020-12-11 NOTE — Progress Notes (Addendum)
Chief Complaint:   OBESITY [name] is here to discuss her progress with her obesity treatment plan along with follow-up of her obesity related diagnoses. [name] is on the Category 2 Plan and the Category 3 Plan per patient and states she is following her eating plan approximately 80% of the time. [name] states she is walking for 30 minutes 3 times per week.  Today's visit was #: 23 Starting weight: 266 lbs Starting date: 09/18/2019 Today's weight: 275 lbs Today's date: 12/11/2020 Total lbs lost to date: 0 Total lbs lost since last in-office visit: 0  Interim History: [name] feels that not being able to exercise hinders her weight loss. She reports getting in the prescribed protein. She does have a protein shake instead of breakfast. She denies intake of sugar sweetened beverages. She does not skip meals.   Subjective:   1. Type 2 diabetes mellitus with hyperglycemia, with long-term current use of insulin (HCC) Melaysia's diabetes is not well controlled. Her last A1C was 7.6 (August 2022). Her fasting blood glucose levels range 105-119. Before meals-150s. She denies hypoglycemia. She is on Lantus, Invokana, Ozempic and glipizide. She notes she has had recent eye and foot check.   Lab Results  Component Value Date   HGBA1C 8.7 (H) 05/13/2020   HGBA1C 7.5 (H) 02/04/2020   HGBA1C 8.2 (H) 09/18/2019   Lab Results  Component Value Date   LDLCALC 82 05/13/2020   CREATININE 0.92 05/13/2020   No results found for: INSULIN   2. Vitamin D deficiency [name] says PCP told her not to take Vitamin D because she does not need it.   Lab Results  Component Value Date   VD25OH 49.3 02/04/2020   VD25OH 107 11/29/2019   VD25OH 30.3 09/18/2019    3. Hyperlipidemia associated with type 2 diabetes mellitus ([place]) [name]'s last LDL was at goal (82). Her HDL is low at 43. Her Triglycerides were within normal limits. She is on Atorvastatin 40 mg daily.   Lab Results  Component Value  Date   CHOL 149 05/13/2020   HDL 43 05/13/2020   LDLCALC 82 05/13/2020   LDLDIRECT 66 04/09/2009   TRIG 136 05/13/2020   CHOLHDL 3.5 05/13/2020   Lab Results  Component Value Date   ALT 27 05/13/2020   AST 22 05/13/2020   ALKPHOS 78 05/13/2020   BILITOT 0.5 05/13/2020   The 10-year ASCVD risk score (Arnett DK, et al., 2019) is: 20.8%   Values used to calculate the score:     Age: 60 years     Sex: Female     Is Non-Hispanic African American: Yes     Diabetic: Yes     Tobacco smoker: No     Systolic Blood Pressure: 242 mmHg     Is BP treated: Yes     HDL Cholesterol: 43 mg/dL     Total Cholesterol: 149 mg/dL   Assessment/Plan:   1. Type 2 diabetes mellitus with hyperglycemia, with long-term current use of insulin ([place]) [name] will continue all medications and continue to FU with PCP for management.   2. Vitamin D deficiency We will bring labs to next OV. She had some done recently at PCP.   3. Hyperlipidemia associated with type 2 diabetes mellitus ([place])  We discussed several lifestyle modifications today and [name] will continue to work on diet, exercise and weight loss efforts. We will refill Atorvastatin 40 mg daily for 3 months with no refills.   - atorvastatin (LIPITOR) 40 MG  tablet; Take 1 tablet (40 mg total) by mouth daily.  Dispense: 60 tablet; Refill: 0  4. Obesity with current BMI of 51.99 [name] is currently in the action stage of change. As such, her goal is to continue with weight loss efforts. She has agreed to the Category 2 Plan.   [name] may have a shake for breakfast plus a piece of toast.  Exercise goals:  [name] will increase walking but will not push through knee pain.  Behavioral modification strategies: increasing lean protein intake and decreasing simple carbohydrates.  [name] has agreed to follow-up with our clinic in 4 weeks.   Objective:   Blood pressure (!) 146/65, pulse 62, temperature 98.7 F (37.1 C), height 5\' 1"  (1.549 m),  weight 275 lb (124.7 kg), SpO2 95 %. Body mass index is 51.96 kg/m.  General: Cooperative, alert, well developed, in no acute distress. HEENT: Conjunctivae and lids unremarkable. Cardiovascular: Regular rhythm.  Lungs: Normal work of breathing. Neurologic: No focal deficits.   Lab Results  Component Value Date   CREATININE 0.92 05/13/2020   BUN 19 05/13/2020   NA 142 05/13/2020   K 4.3 05/13/2020   CL 99 05/13/2020   CO2 27 05/13/2020   Lab Results  Component Value Date   ALT 27 05/13/2020   AST 22 05/13/2020   ALKPHOS 78 05/13/2020   BILITOT 0.5 05/13/2020   Lab Results  Component Value Date   HGBA1C 8.7 (H) 05/13/2020   HGBA1C 7.5 (H) 02/04/2020   HGBA1C 8.2 (H) 09/18/2019   HGBA1C 11.8 07/22/2009   HGBA1C >14.0 04/09/2009   No results found for: INSULIN Lab Results  Component Value Date   TSH 1.00 11/29/2019   Lab Results  Component Value Date   CHOL 149 05/13/2020   HDL 43 05/13/2020   LDLCALC 82 05/13/2020   LDLDIRECT 66 04/09/2009   TRIG 136 05/13/2020   CHOLHDL 3.5 05/13/2020   Lab Results  Component Value Date   VD25OH 49.3 02/04/2020   VD25OH 107 11/29/2019   VD25OH 30.3 09/18/2019   Lab Results  Component Value Date   WBC 6.5 11/29/2019   HGB 14.5 11/29/2019   HCT 47 (A) 11/29/2019   MCV 85 09/18/2019   PLT 349 11/29/2019   No results found for: IRON, TIBC, FERRITIN  Obesity Behavioral Intervention:   Approximately 15 minutes were spent on the discussion below.  ASK: We discussed the diagnosis of obesity with [name] today and [name] agreed to give [place] permission to discuss obesity behavioral modification therapy today.  ASSESS: [name] has the diagnosis of obesity and her BMI today is 52.0. [name] is in the action stage of change.   ADVISE: [name] was educated on the multiple health risks of obesity as well as the benefit of weight loss to improve her health. She was advised of the need for long term treatment and the importance  of lifestyle modifications to improve her current health and to decrease her risk of future health problems.  AGREE: Multiple dietary modification options and treatment options were discussed and Tamber agreed to follow the recommendations documented in the above note.  ARRANGE: [name] was educated on the importance of frequent visits to treat obesity as outlined per CMS and USPSTF guidelines and agreed to schedule her next follow up appointment today.  Attestation Statements:   Reviewed by clinician on day of visit: allergies, medications, problem list, medical history, surgical history, family history, social history, and previous encounter notes.  I, [name], RMA, am acting  as Location manager for [employer], [place].   I have reviewed the above documentation for accuracy and completeness, and I agree with the above. -  Theresa Fick, FNP

## 2020-12-13 NOTE — Progress Notes (Signed)
See note scribed by Lizbeth Bark, CMA.

## 2020-12-16 ENCOUNTER — Encounter (INDEPENDENT_AMBULATORY_CARE_PROVIDER_SITE_OTHER): Payer: Self-pay

## 2020-12-16 NOTE — Telephone Encounter (Signed)
Called again today to PCP's office to get recent lab results.  Left a message on the voicemail.

## 2020-12-21 ENCOUNTER — Other Ambulatory Visit (INDEPENDENT_AMBULATORY_CARE_PROVIDER_SITE_OTHER): Payer: Self-pay | Admitting: Family Medicine

## 2020-12-21 DIAGNOSIS — E1169 Type 2 diabetes mellitus with other specified complication: Secondary | ICD-10-CM

## 2020-12-21 DIAGNOSIS — Z794 Long term (current) use of insulin: Secondary | ICD-10-CM

## 2020-12-26 ENCOUNTER — Encounter (HOSPITAL_COMMUNITY): Payer: Self-pay | Admitting: Gastroenterology

## 2021-01-02 ENCOUNTER — Other Ambulatory Visit: Payer: Self-pay

## 2021-01-02 ENCOUNTER — Telehealth: Payer: Self-pay | Admitting: Gastroenterology

## 2021-01-02 DIAGNOSIS — Z1211 Encounter for screening for malignant neoplasm of colon: Secondary | ICD-10-CM

## 2021-01-02 MED ORDER — PLENVU 140 G PO SOLR: 1.0000 | ORAL | 0 refills | Status: DC

## 2021-01-02 NOTE — Telephone Encounter (Signed)
Spoke with patient, she would like prep sent to [facility] on file. I have added Medicare coupon code to prescription. Pt will pick up prescription this weekend. Pt had no other concerns at the end of the call.

## 2021-01-02 NOTE — Telephone Encounter (Signed)
Inbound call from pt requesting a call back stating she has not received her prep. Pt procedure is 01/06/21 at [facility]. Please advise.

## 2021-01-05 ENCOUNTER — Other Ambulatory Visit (INDEPENDENT_AMBULATORY_CARE_PROVIDER_SITE_OTHER): Payer: Self-pay

## 2021-01-05 NOTE — Telephone Encounter (Signed)
Called and spoke to [place].   Requested recent labs to be sent over.

## 2021-01-05 NOTE — Telephone Encounter (Signed)
Labs received today.

## 2021-01-05 NOTE — Anesthesia Preprocedure Evaluation (Addendum)
Anesthesia Evaluation  Patient identified by MRN, date of birth, ID band Patient awake    Reviewed: Allergy & Precautions, H&P , NPO status , Patient's Chart, lab work & pertinent test results  Airway Mallampati: III  TM Distance: >3 FB Neck ROM: Full    Dental no notable dental hx. (+) Teeth Intact, Dental Advisory Given   Pulmonary neg pulmonary ROS, former smoker,    Pulmonary exam normal breath sounds clear to auscultation       Cardiovascular Exercise Tolerance: Good hypertension, Pt. on medications and Pt. on home beta blockers  Rhythm:Regular Rate:Normal     Neuro/Psych Depression negative neurological ROS     GI/Hepatic Neg liver ROS, GERD  Medicated,  Endo/Other  negative endocrine ROSdiabetes, Insulin Dependent, Oral Hypoglycemic AgentsMorbid obesity  Renal/GU negative Renal ROS  negative genitourinary   Musculoskeletal  (+) Arthritis , Osteoarthritis,    Abdominal   Peds  Hematology negative hematology ROS (+)   Anesthesia Other Findings   Reproductive/Obstetrics negative OB ROS                            Anesthesia Physical Anesthesia Plan  ASA: 3  Anesthesia Plan: MAC   Post-op Pain Management:    Induction: Intravenous  PONV Risk Score and Plan: 2 and Propofol infusion and Treatment may vary due to age or medical condition  Airway Management Planned: Simple Face Mask  Additional Equipment:   Intra-op Plan:   Post-operative Plan:   Informed Consent: I have reviewed the patients History and Physical, chart, labs and discussed the procedure including the risks, benefits and alternatives for the proposed anesthesia with the patient or authorized representative who has indicated his/her understanding and acceptance.     Dental advisory given  Plan Discussed with: CRNA  Anesthesia Plan Comments:        Anesthesia Quick Evaluation

## 2021-01-06 ENCOUNTER — Other Ambulatory Visit: Payer: Self-pay

## 2021-01-06 ENCOUNTER — Encounter (HOSPITAL_COMMUNITY)
Admission: RE | Disposition: A | Discharge status: Home / Self Care | Payer: Self-pay | Source: Home / Self Care | Attending: Gastroenterology

## 2021-01-06 ENCOUNTER — Ambulatory Visit (HOSPITAL_COMMUNITY): Payer: Medicare HMO | Admitting: Certified Registered Nurse Anesthetist

## 2021-01-06 ENCOUNTER — Encounter (HOSPITAL_COMMUNITY): Payer: Self-pay | Admitting: Gastroenterology

## 2021-01-06 ENCOUNTER — Other Ambulatory Visit (INDEPENDENT_AMBULATORY_CARE_PROVIDER_SITE_OTHER): Payer: Self-pay

## 2021-01-06 ENCOUNTER — Ambulatory Visit (HOSPITAL_COMMUNITY)
Admission: RE | Admit: 2021-01-06 | Discharge: 2021-01-06 | Disposition: A | Discharge status: Home / Self Care | Payer: Medicare HMO | Attending: Gastroenterology | Admitting: Gastroenterology

## 2021-01-06 DIAGNOSIS — D120 Benign neoplasm of cecum: Secondary | ICD-10-CM | POA: Insufficient documentation

## 2021-01-06 DIAGNOSIS — K5730 Diverticulosis of large intestine without perforation or abscess without bleeding: Secondary | ICD-10-CM | POA: Diagnosis not present

## 2021-01-06 DIAGNOSIS — Z79899 Other long term (current) drug therapy: Secondary | ICD-10-CM | POA: Insufficient documentation

## 2021-01-06 DIAGNOSIS — Z7984 Long term (current) use of oral hypoglycemic drugs: Secondary | ICD-10-CM | POA: Diagnosis not present

## 2021-01-06 DIAGNOSIS — Z1211 Encounter for screening for malignant neoplasm of colon: Secondary | ICD-10-CM

## 2021-01-06 DIAGNOSIS — D126 Benign neoplasm of colon, unspecified: Secondary | ICD-10-CM | POA: Diagnosis not present

## 2021-01-06 DIAGNOSIS — Z794 Long term (current) use of insulin: Secondary | ICD-10-CM | POA: Insufficient documentation

## 2021-01-06 DIAGNOSIS — Z8349 Family history of other endocrine, nutritional and metabolic diseases: Secondary | ICD-10-CM | POA: Diagnosis not present

## 2021-01-06 DIAGNOSIS — E119 Type 2 diabetes mellitus without complications: Secondary | ICD-10-CM | POA: Diagnosis not present

## 2021-01-06 DIAGNOSIS — D123 Benign neoplasm of transverse colon: Secondary | ICD-10-CM | POA: Insufficient documentation

## 2021-01-06 DIAGNOSIS — K648 Other hemorrhoids: Secondary | ICD-10-CM | POA: Diagnosis not present

## 2021-01-06 DIAGNOSIS — Z8249 Family history of ischemic heart disease and other diseases of the circulatory system: Secondary | ICD-10-CM | POA: Diagnosis not present

## 2021-01-06 DIAGNOSIS — Z87891 Personal history of nicotine dependence: Secondary | ICD-10-CM | POA: Insufficient documentation

## 2021-01-06 HISTORY — PX: COLONOSCOPY WITH PROPOFOL: SHX5780

## 2021-01-06 HISTORY — PX: POLYPECTOMY: SHX5525

## 2021-01-06 LAB — GLUCOSE, CAPILLARY
Glucose-Capillary: 86 mg/dL (ref 70–99)
Glucose-Capillary: 84 mg/dL (ref 70–99)

## 2021-01-06 SURGERY — COLONOSCOPY WITH PROPOFOL
Anesthesia: Monitor Anesthesia Care

## 2021-01-06 MED ORDER — PROPOFOL 500 MG/50ML IV EMUL
INTRAVENOUS | Status: DC | PRN
  Administered 2021-01-06: 125 ug/kg/min via INTRAVENOUS

## 2021-01-06 MED ORDER — PROPOFOL 10 MG/ML IV BOLUS
INTRAVENOUS | Status: DC | PRN
  Administered 2021-01-06 (×2): 25 mg via INTRAVENOUS

## 2021-01-06 MED ORDER — PHENYLEPHRINE HCL (PRESSORS) 10 MG/ML IV SOLN
INTRAVENOUS | Status: DC | PRN
  Administered 2021-01-06 (×3): 120 ug via INTRAVENOUS
  Administered 2021-01-06: 200 ug via INTRAVENOUS

## 2021-01-06 MED ORDER — SODIUM CHLORIDE 0.9 % IV SOLN: INTRAVENOUS | Status: DC

## 2021-01-06 MED ORDER — LACTATED RINGERS IV SOLN: INTRAVENOUS | Status: DC | PRN

## 2021-01-06 SURGICAL SUPPLY — 22 items

## 2021-01-06 NOTE — Anesthesia Postprocedure Evaluation (Signed)
Anesthesia Post Note  Patient: [name]  Procedure(s) Performed: COLONOSCOPY WITH PROPOFOL POLYPECTOMY     Patient location during evaluation: Endoscopy Anesthesia Type: MAC Level of consciousness: awake and alert Pain management: pain level controlled Vital Signs Assessment: post-procedure vital signs reviewed and stable Respiratory status: spontaneous breathing, nonlabored ventilation and respiratory function stable Cardiovascular status: stable and blood pressure returned to baseline Postop Assessment: no apparent nausea or vomiting Anesthetic complications: no   No notable events documented.  Last Vitals:  Vitals:   01/06/21 0920 01/06/21 0926  BP: 134/71 120/72  Pulse: (!) 58 (!) 52  Resp: 16 13  Temp:    SpO2: 94% 96%    Last Pain:  Vitals:   01/06/21 0920  TempSrc:   PainSc: 0-No pain                 [name],W. EDMOND

## 2021-01-06 NOTE — Transfer of Care (Signed)
Immediate Anesthesia Transfer of Care Note  Patient: [name]  Procedure(s) Performed: COLONOSCOPY WITH PROPOFOL POLYPECTOMY  Patient Location: PACU and Endoscopy Unit  Anesthesia Type:MAC  Level of Consciousness: drowsy  Airway & Oxygen Therapy: Patient Spontanous Breathing and Patient connected to face mask  Post-op Assessment: Report given to RN and Post -op Vital signs reviewed and stable  Post vital signs: Reviewed and stable  Last Vitals:  Vitals Value Taken Time  BP 104/56 01/06/21 0905  Temp    Pulse 62 01/06/21 0907  Resp 17 01/06/21 0907  SpO2 98 % 01/06/21 0907  Vitals shown include unvalidated device data.  Last Pain:  Vitals:   01/06/21 0728  TempSrc: Oral         Complications: No notable events documented.

## 2021-01-06 NOTE — H&P (Signed)
[facility] History and Physical   Primary Care Physician:  Nolene Ebbs, MD   Reason for Procedure:   screening  Plan:    colonoscopy     HPI: [name] is a 60 y.o. female  here for colonoscopy screening, last exam done in 2010 - normal.. Patient denies any bowel symptoms at this time. No family history of colon cancer known. Otherwise feels well without any cardiopulmonary symptoms. She has a BMI > 50 so her exam is being done at the hospital for anesthesia support.   I have discussed risks / benefits of endoscopy and anesthesia with the patient, she understands and wishes to proceed, all questions answered.   Past Medical History:  Diagnosis Date   Acid reflux    Arthritis    Back pain    Depression    Diabetes mellitus without complication (HCC)    Edema, lower extremity    Glaucoma    High cholesterol    Hypertension    Joint pain    Nerve damage    Shortness of breath    Vitamin D deficiency     Past Surgical History:  Procedure Laterality Date   btl     CHOLECYSTECTOMY  2001    Prior to Admission medications   Medication Sig Start Date End Date Taking? Authorizing Provider  acetaminophen (TYLENOL) 650 MG CR tablet Take 650 mg by mouth every 8 (eight) hours as needed for pain.   Yes [provider]  aspirin 81 MG EC tablet Take 81 mg by mouth daily.   Yes [provider]  atorvastatin (LIPITOR) 40 MG tablet Take 1 tablet (40 mg total) by mouth daily. Patient taking differently: Take 40 mg by mouth at bedtime. 12/11/20  Yes Whitmire, Dawn W, FNP  cyclobenzaprine (FLEXERIL) 10 MG tablet TAKE 1 TABLET BY MOUTH AT BEDTIME AS NEEDED BACK PAIN 10/08/10  Yes [employer], Ivy, DO  fluticasone (FLONASE) 50 MCG/ACT nasal spray Place 1 spray into both nostrils in the morning and at bedtime.   Yes [provider]  furosemide (LASIX) 20 MG tablet Take 20 mg by mouth daily.   Yes [provider]  gabapentin (NEURONTIN)  600 MG tablet Take 600 mg by mouth 3 (three) times daily.   Yes [provider]  glipiZIDE (GLUCOTROL) 10 MG tablet Take 10 mg by mouth daily before breakfast.   Yes [provider]  glucose blood test strip 1 each by Other route as needed. Use as instructed   Yes [provider]  hydrOXYzine (ATARAX/VISTARIL) 25 MG tablet Take 25 mg by mouth at bedtime.   Yes [provider]  insulin glargine (LANTUS) 100 UNIT/ML injection Inject 30-50 Units into the skin See admin instructions. Inject 50 units into the skin in the morning and 30 units in the afternoon   Yes [provider]  Insulin Pen Needle 31G X 8 MM MISC by Does not apply route. dispense enough for once daily dosing.   Yes [provider]  Insulin Syringe-Needle U-100 30G X 1/2" 1 ML MISC by Does not apply route. dispense enough for once daily dosing.   Yes [provider]  INVOKANA 300 MG TABS tablet Take 300 mg by mouth daily before breakfast. 09/02/19  Yes [provider]  latanoprost (XALATAN) 0.005 % ophthalmic solution Place 1 drop into both eyes at bedtime.   Yes [provider]  loratadine (CLARITIN) 10 MG tablet Take 10 mg by mouth daily.  Yes [provider]  magnesium oxide (MAG-OX) 400 MG tablet Take 400 mg by mouth daily.   Yes [provider]  meloxicam (MOBIC) 7.5 MG tablet Take 7.5 mg by mouth in the morning and at bedtime.   Yes [provider]  metFORMIN (GLUCOPHAGE) 500 MG tablet Take 500 mg by mouth 2 (two) times daily with a meal.   Yes [provider]  metoprolol (LOPRESSOR) 50 MG tablet Take 50 mg by mouth 2 (two) times daily.   Yes [provider]  Multiple Vitamin (MULTIVITAMIN WITH MINERALS) TABS tablet Take 1 tablet by mouth daily.   Yes [provider]  nitroGLYCERIN (NITROSTAT) 0.4 MG SL tablet Place 0.4 mg under the tongue every 5 (five) minutes as needed. Place 1 under tongue if  chest pain.  If pain does NOT resolve in 5 min Call 911 and take 2nd.  Take a 3rd dose if pain continues.   Yes [provider]  omeprazole (PRILOSEC) 40 MG capsule Take 40 mg by mouth daily.   Yes [provider]  PEG-KCl-NaCl-NaSulf-Na Asc-C (PLENVU) 140 g SOLR Take 1 kit by mouth as directed. 01/02/21  Yes Ndeye Tenorio, Carlota Raspberry, MD  quinapril-hydrochlorothiazide (ACCURETIC) 20-25 MG per tablet Take 1 tablet by mouth daily.   Yes [provider]  Semaglutide, 1 MG/DOSE, (OZEMPIC, 1 MG/DOSE,) 2 MG/1.5ML SOPN Inject 1 mg into the skin once a week. 10/23/20  Yes Opalski, Neoma Laming, DO  timolol (TIMOPTIC) 0.5 % ophthalmic solution Place 1 drop into both eyes in the morning and at bedtime. 10/28/20  Yes [provider]  tiZANidine (ZANAFLEX) 4 MG tablet Take 4 mg by mouth 3 (three) times daily as needed for muscle spasms. 12/22/20  Yes [provider]  traMADol (ULTRAM) 50 MG tablet Take 50 mg by mouth every 6 (six) hours as needed for moderate pain.   Yes [provider]  meloxicam (MOBIC) 15 MG tablet TAKE ONE TABLET DAILY WITH FOOD FOR PAIN Patient not taking: No sig reported 06/06/10   Zenia Resides, MD  naproxen (NAPROSYN) 500 MG tablet Take 1 tablet (500 mg total) by mouth 2 (two) times daily. Patient not taking: No sig reported 03/02/15   Ward, Ozella Almond, PA-C  pantoprazole (PROTONIX) 20 MG tablet Take 2 tablets (40 mg total) by mouth daily. Patient not taking: No sig reported 03/02/15   Ward, Ozella Almond, PA-C    Current Facility-Administered Medications  Medication Dose Route Frequency Provider Last Rate Last Admin   0.9 %  sodium chloride infusion   Intravenous Continuous Nelida Meuse III, MD        Allergies as of 11/17/2020   (No Known Allergies)    Family History  Problem Relation Age of Onset   Obesity Mother    Eating disorder Mother    Hypertension Father    Hyperlipidemia Father    Alcoholism Father     Social  History   Socioeconomic History   Marital status: Single    Spouse name: Not on file   Number of children: Not on file   Years of education: Not on file   Highest education level: Not on file  Occupational History   Occupation: [place]  Tobacco Use   Smoking status: Former    Packs/day: 1.00    Types: Cigarettes    Quit date: 2001    Years since quitting: 21.8   Smokeless tobacco: Never  Substance and Sexual Activity   Alcohol use:  No   Drug use: No   Sexual activity: Not on file  Other Topics Concern   Not on file  Social History Narrative   Not on file   Social Determinants of Health   Financial Resource Strain: Not on file  Food Insecurity: Not on file  Transportation Needs: Not on file  Physical Activity: Not on file  Stress: Not on file  Social Connections: Not on file  Intimate Partner Violence: Not on file    Review of Systems: All other review of systems negative except as mentioned in the HPI.  Physical Exam:  General:   Alert,  Well-developed, pleasant and cooperative in NAD Lungs:  Clear throughout to auscultation.   Heart:  Regular rate and rhythm Abdomen:  Soft, nontender and nondistended.   Neuro/Psych:  Alert and cooperative. Normal mood and affect. A and O x 3  Jolly Mango, MD [facility]

## 2021-01-06 NOTE — Discharge Instructions (Signed)
YOU HAD AN ENDOSCOPIC PROCEDURE TODAY: Refer to the procedure report and other information in the discharge instructions given to you for any specific questions about what was found during the examination. If this information does not answer your questions, please call [place] office at [phone] to clarify.  ° °YOU SHOULD EXPECT: Some feelings of bloating in the abdomen. Passage of more gas than usual. Walking can help get rid of the air that was put into your GI tract during the procedure and reduce the bloating. If you had a lower endoscopy (such as a colonoscopy or flexible sigmoidoscopy) you may notice spotting of blood in your stool or on the toilet paper. Some abdominal soreness may be present for a day or two, also. ° °DIET: Your first meal following the procedure should be a light meal and then it is ok to progress to your normal diet. A half-sandwich or bowl of soup is an example of a good first meal. Heavy or fried foods are harder to digest and may make you feel nauseous or bloated. Drink plenty of fluids but you should avoid alcoholic beverages for 24 hours. If you had a esophageal dilation, please see attached instructions for diet.   ° °ACTIVITY: Your care partner should take you home directly after the procedure. You should plan to take it easy, moving slowly for the rest of the day. You can resume normal activity the day after the procedure however YOU SHOULD NOT DRIVE, use power tools, machinery or perform tasks that involve climbing or major physical exertion for 24 hours (because of the sedation medicines used during the test).  ° °SYMPTOMS TO REPORT IMMEDIATELY: °A gastroenterologist can be reached at any hour. Please call [phone]  for any of the following symptoms:  °Following lower endoscopy (colonoscopy, flexible sigmoidoscopy) °Excessive amounts of blood in the stool  °Significant tenderness, worsening of abdominal pains  °Swelling of the abdomen that is new, acute  °Fever of 100° or  higher  °Following upper endoscopy (EGD, EUS, ERCP, esophageal dilation) °Vomiting of blood or coffee ground material  °New, significant abdominal pain  °New, significant chest pain or pain under the shoulder blades  °Painful or persistently difficult swallowing  °New shortness of breath  °Black, tarry-looking or red, bloody stools ° °FOLLOW UP:  °If any biopsies were taken you will be contacted by phone or by letter within the next 1-3 weeks. Call [phone]  if you have not heard about the biopsies in 3 weeks.  °Please also call with any specific questions about appointments or follow up tests. ° °

## 2021-01-06 NOTE — Op Note (Signed)
[facility] Patient Name: [name] Procedure Date: 01/06/2021 MRN: [id number] Attending MD: Carlota Raspberry. Havery Moros , MD Date of Birth: [birth year] CSN: [id number] Age: 60 Admit Type: Outpatient Procedure:                Colonoscopy Indications:              Screening for colorectal malignant neoplasm Providers:                Remo Lipps P. Havery Moros, MD, Lurline Del, RN, Jeanella Cara, RN, Frazier Richards, Technician Referring MD:              Medicines:                Monitored Anesthesia Care Complications:            No immediate complications. Estimated blood loss:                            Minimal. Estimated Blood Loss:     Estimated blood loss was minimal. Procedure:                Pre-Anesthesia Assessment:                           - Prior to the procedure, a History and Physical                            was performed, and patient medications and                            allergies were reviewed. The patient's tolerance of                            previous anesthesia was also reviewed. The risks                            and benefits of the procedure and the sedation                            options and risks were discussed with the patient.                            All questions were answered, and informed consent                            was obtained. Prior Anticoagulants: The patient has                            taken no previous anticoagulant or antiplatelet                            agents. ASA Grade Assessment: III - A patient with  severe systemic disease. After reviewing the risks                            and benefits, the patient was deemed in                            satisfactory condition to undergo the procedure.                           After obtaining informed consent, the colonoscope                            was passed under direct vision. Throughout the                             procedure, the patient's blood pressure, pulse, and                            oxygen saturations were monitored continuously. The                            CF-HQ190L ([id number]) Olympus colonoscope was                            introduced through the anus and advanced to the the                            cecum, identified by appendiceal orifice and                            ileocecal valve. The colonoscopy was performed                            without difficulty. The patient tolerated the                            procedure well. The quality of the bowel                            preparation was adequate. The ileocecal valve,                            appendiceal orifice, and rectum were photographed. Scope In: 8:33:29 AM Scope Out: 8:55:25 AM Scope Withdrawal Time: 0 hours 20 minutes 12 seconds  Total Procedure Duration: 0 hours 21 minutes 56 seconds  Findings:      The perianal and digital rectal examinations were normal.      A 3 to 4 mm polyp was found in the cecum. The polyp was sessile. The       polyp was removed with a cold snare. Resection and retrieval were       complete.      A 4 mm polyp was found in the transverse colon. The polyp was flat. The       polyp was removed with a cold snare. Resection and retrieval were  complete.      Multiple medium-mouthed diverticula were found in the entire colon.      Internal hemorrhoids were found during retroflexion.      The exam was otherwise without abnormality. The prep was adequate but       took several minutes to lavage the colon to achieve adequate views. Impression:               - One 3 to 4 mm polyp in the cecum, removed with a                            cold snare. Resected and retrieved.                           - One 4 mm polyp in the transverse colon, removed                            with a cold snare. Resected and retrieved.                           - Diverticulosis in the entire examined  colon.                           - Internal hemorrhoids.                           - The examination was otherwise normal. Moderate Sedation:      No moderate sedation, case performed with MAC Recommendation:           - Patient has a contact number available for                            emergencies. The signs and symptoms of potential                            delayed complications were discussed with the                            patient. Return to normal activities tomorrow.                            Written discharge instructions were provided to the                            patient.                           - Resume previous diet.                           - Continue present medications.                           - Await pathology results. Procedure Code(s):        --- Professional ---  45385, Colonoscopy, flexible; with removal of                            tumor(s), polyp(s), or other lesion(s) by snare                            technique Diagnosis Code(s):        --- Professional ---                           Z12.11, Encounter for screening for malignant                            neoplasm of colon                           K63.5, Polyp of colon                           K64.8, Other hemorrhoids                           K57.30, Diverticulosis of large intestine without                            perforation or abscess without bleeding CPT copyright 2019 American Medical Association. All rights reserved. The codes documented in this report are preliminary and upon coder review may  be revised to meet current compliance requirements. Remo Lipps P. Andy Moye, MD 01/06/2021 9:01:32 AM This report has been signed electronically. Number of Addenda: 0

## 2021-01-07 LAB — SURGICAL PATHOLOGY

## 2021-01-08 ENCOUNTER — Other Ambulatory Visit: Payer: Self-pay

## 2021-01-08 ENCOUNTER — Encounter (INDEPENDENT_AMBULATORY_CARE_PROVIDER_SITE_OTHER): Payer: Self-pay | Admitting: Family Medicine

## 2021-01-08 ENCOUNTER — Ambulatory Visit (INDEPENDENT_AMBULATORY_CARE_PROVIDER_SITE_OTHER): Payer: Medicare HMO | Admitting: Family Medicine

## 2021-01-08 VITALS — BP 124/79 | HR 84 | Temp 98.2°F | Ht 61.0 in | Wt 274.0 lb

## 2021-01-08 DIAGNOSIS — Z794 Long term (current) use of insulin: Secondary | ICD-10-CM

## 2021-01-08 DIAGNOSIS — G4710 Hypersomnia, unspecified: Secondary | ICD-10-CM | POA: Diagnosis not present

## 2021-01-08 DIAGNOSIS — Z6841 Body Mass Index (BMI) 40.0 and over, adult: Secondary | ICD-10-CM

## 2021-01-08 DIAGNOSIS — E1169 Type 2 diabetes mellitus with other specified complication: Secondary | ICD-10-CM | POA: Diagnosis not present

## 2021-01-08 MED ORDER — OZEMPIC (1 MG/DOSE) 2 MG/1.5ML ~~LOC~~ SOPN: 1.0000 mg | PEN_INJECTOR | SUBCUTANEOUS | 0 refills | Status: DC

## 2021-01-08 NOTE — Progress Notes (Signed)
Chief Complaint:   OBESITY [name] is here to discuss her progress with her obesity treatment plan along with follow-up of her obesity related diagnoses. Sharnita is on the Category 2 Plan and states she is following her eating plan approximately 95% of the time. Aireana states she is walking 30 minutes 3 times per week.  Today's visit was #: 24 Starting weight: 266 lbs Starting date: 09/18/2019 Today's weight: 274 lbs Today's date: 01/08/2021 Total lbs lost to date: 0 Total lbs lost since last in-office visit: 1  Interim History: Anjalina had a colonoscopy and couldn't eat during those days. She has no appetite, especially in the mornings- not able to eat.   Subjective:   1. Excessive sleepiness Alyson has excessive daytime sleepiness and no restful sleep. We discussed this at her 1st OV, but pt hasn't gone yet.  2. Type 2 diabetes mellitus with other specified complication, with long-term current use of insulin (HCC) Pt's fasting blood sugars run in the 120's. Medication: Metformin, Invokana, Lantus, Glucotrol, Ozempic  Lab Results  Component Value Date   HGBA1C 8.7 (H) 05/13/2020   HGBA1C 7.5 (H) 02/04/2020   HGBA1C 8.2 (H) 09/18/2019   Lab Results  Component Value Date   LDLCALC 77 10/27/2020   CREATININE 0.9 10/27/2020    Assessment/Plan:  No orders of the defined types were placed in this encounter.   Medications Discontinued During This Encounter  Medication Reason   Semaglutide, 1 MG/DOSE, (OZEMPIC, 1 MG/DOSE,) 2 MG/1.5ML SOPN Reorder     Meds ordered this encounter  Medications   Semaglutide, 1 MG/DOSE, (OZEMPIC, 1 MG/DOSE,) 2 MG/1.5ML SOPN    Sig: Inject 1 mg into the skin once a week.    Dispense:  9 mL    Refill:  0    Pt states she needs 90-d supply     1. Excessive sleepiness Follow up with PCP in the very near future regarding going for evaluation for OSA. I recommend [facility].  2. Type 2 diabetes mellitus with other specified  complication, with long-term current use of insulin (HCC) Good blood sugar control is important to decrease the likelihood of diabetic complications such as nephropathy, neuropathy, limb loss, blindness, coronary artery disease, and death. Intensive lifestyle modification including diet, exercise and weight loss are the first line of treatment for diabetes.   Refill- Semaglutide, 1 MG/DOSE, (OZEMPIC, 1 MG/DOSE,) 2 MG/1.5ML SOPN; Inject 1 mg into the skin once a week.  Dispense: 9 mL; Refill: 0  3. Obesity with current BMI of 51.8  Dreamer is currently in the action stage of change. As such, her goal is to continue with weight loss efforts. She has agreed to the Category 2 Plan.   On 10/27/2020- CPE with PCP at [facility]. Pt will bring in results to next OV or we will obtain on next OV.  Exercise goals:  Increase walking to 30 minutes 5 days a week.  Behavioral modification strategies: planning for success.  Delesha has agreed to follow-up with our clinic in 4 weeks, per pt request. Pt will come fasting for IC. She was informed of the importance of frequent follow-up visits to maximize her success with intensive lifestyle modifications for her multiple health conditions.   Objective:   Blood pressure 124/79, pulse 84, temperature 98.2 F (36.8 C), height 5\' 1"  (1.549 m), weight 274 lb (124.3 kg), SpO2 98 %. Body mass index is 51.77 kg/m.  General: Cooperative, alert, well developed, in no acute distress. HEENT: Conjunctivae and  lids unremarkable. Cardiovascular: Regular rhythm.  Lungs: Normal work of breathing. Neurologic: No focal deficits.   Lab Results  Component Value Date   CREATININE 0.9 10/27/2020   BUN 30 (A) 10/27/2020   NA 143 10/27/2020   K 4.3 10/27/2020   CL 100 10/27/2020   CO2 34 (A) 10/27/2020   Lab Results  Component Value Date   ALT 14 10/27/2020   AST 14 10/27/2020   ALKPHOS 84 10/27/2020   BILITOT 0.5 05/13/2020   Lab Results  Component Value Date   HGBA1C  8.7 (H) 05/13/2020   HGBA1C 7.5 (H) 02/04/2020   HGBA1C 8.2 (H) 09/18/2019   HGBA1C 11.8 07/22/2009   HGBA1C >14.0 04/09/2009   No results found for: INSULIN Lab Results  Component Value Date   TSH 1.32 10/27/2020   Lab Results  Component Value Date   CHOL 168 10/27/2020   HDL 53 10/27/2020   LDLCALC 77 10/27/2020   LDLDIRECT 66 04/09/2009   TRIG 188 (A) 10/27/2020   CHOLHDL 3.5 05/13/2020   Lab Results  Component Value Date   VD25OH 36.2 10/27/2020   VD25OH 49.3 02/04/2020   VD25OH 107 11/29/2019   Lab Results  Component Value Date   WBC 6.4 10/27/2020   HGB 13.9 10/27/2020   HCT 44 10/27/2020   MCV 85 09/18/2019   PLT 283 10/27/2020   No results found for: IRON, TIBC, FERRITIN  Obesity Behavioral Intervention:   Approximately 15 minutes were spent on the discussion below.  ASK: We discussed the diagnosis of obesity with Marieme today and Sarahann agreed to give [place] permission to discuss obesity behavioral modification therapy today.  ASSESS: Mykiah has the diagnosis of obesity and her BMI today is 51.8. Medora is in the action stage of change.   ADVISE: Amariona was educated on the multiple health risks of obesity as well as the benefit of weight loss to improve her health. She was advised of the need for long term treatment and the importance of lifestyle modifications to improve her current health and to decrease her risk of future health problems.  AGREE: Multiple dietary modification options and treatment options were discussed and Kolette agreed to follow the recommendations documented in the above note.  ARRANGE: Yanet was educated on the importance of frequent visits to treat obesity as outlined per CMS and USPSTF guidelines and agreed to schedule her next follow up appointment today.  Attestation Statements:   Reviewed by clinician on day of visit: allergies, medications, problem list, medical history, surgical history, family history, social  history, and previous encounter notes.  Coral Ceo, CMA, am acting as transcriptionist for [employer], DO.  I have reviewed the above documentation for accuracy and completeness, and I agree with the above. Marjory Sneddon, D.O.  The [place] was signed into law in 2016 which includes the topic of electronic health records.  This provides immediate access to information in MyChart.  This includes consultation notes, operative notes, office notes, lab results and pathology reports.  If you have any questions about what you read please let us know at your next visit so we can discuss your concerns and take corrective action if need be.  We are right here with you.

## 2021-02-02 ENCOUNTER — Ambulatory Visit (INDEPENDENT_AMBULATORY_CARE_PROVIDER_SITE_OTHER): Payer: Medicare HMO | Admitting: Family Medicine

## 2021-02-02 ENCOUNTER — Encounter (INDEPENDENT_AMBULATORY_CARE_PROVIDER_SITE_OTHER): Payer: Self-pay | Admitting: Family Medicine

## 2021-02-02 ENCOUNTER — Other Ambulatory Visit: Payer: Self-pay

## 2021-02-02 VITALS — BP 126/82 | HR 63 | Temp 97.4°F | Ht 61.0 in | Wt 276.0 lb

## 2021-02-02 DIAGNOSIS — E1169 Type 2 diabetes mellitus with other specified complication: Secondary | ICD-10-CM

## 2021-02-02 DIAGNOSIS — Z6841 Body Mass Index (BMI) 40.0 and over, adult: Secondary | ICD-10-CM | POA: Diagnosis not present

## 2021-02-02 DIAGNOSIS — Z794 Long term (current) use of insulin: Secondary | ICD-10-CM | POA: Diagnosis not present

## 2021-02-02 MED ORDER — METFORMIN HCL 500 MG PO TABS: 500.0000 mg | ORAL_TABLET | Freq: Two times a day (BID) | ORAL | 0 refills | Status: DC

## 2021-02-02 MED ORDER — OZEMPIC (1 MG/DOSE) 2 MG/1.5ML ~~LOC~~ SOPN: 1.0000 mg | PEN_INJECTOR | SUBCUTANEOUS | 0 refills | Status: DC

## 2021-02-02 NOTE — Progress Notes (Signed)
Chief Complaint:   OBESITY [name] is here to discuss her progress with her obesity treatment plan along with follow-up of her obesity related diagnoses. [name] is on the Category 2 Plan and states she is following her eating plan approximately 85% of the time. [name] states she is walking 15 minutes 3 times per week.  Today's visit was #: 25 Starting weight: 266 lbs Starting date: 09/18/2019 Today's weight: 276 lbs Today's date: 02/02/2021 Total lbs lost to date: 0 Total lbs lost since last in-office visit: +2  Interim History: [name]'s sister has been in the hospital for a couple of weeks. Pt is not meal prepping or spending time on herself since caring for her diabetic sister. She has been skipping meals and eating out a lot more than usual.  Subjective:   1. Type 2 diabetes mellitus with other specified complication, with long-term current use of insulin (HCC) [name]'s A1c was 7.2 about 10 days ago at [employer] office. Her fasting blood sugar high was 127 and lowest was 90. She is tolerating Ozempic well with no side effects or lows. Medication: Ozempic, Invokana, Metformin, Glucotrol, Lantus 30 units qhs  Assessment/Plan:  No orders of the defined types were placed in this encounter.   Medications Discontinued During This Encounter  Medication Reason   metFORMIN (GLUCOPHAGE) 500 MG tablet Reorder   Semaglutide, 1 MG/DOSE, (OZEMPIC, 1 MG/DOSE,) 2 MG/1.5ML SOPN Reorder     Meds ordered this encounter  Medications   Semaglutide, 1 MG/DOSE, (OZEMPIC, 1 MG/DOSE,) 2 MG/1.5ML SOPN    Sig: Inject 1 mg into the skin once a week.    Dispense:  9 mL    Refill:  0    Pt states she needs 90-d supply   metFORMIN (GLUCOPHAGE) 500 MG tablet    Sig: Take 1 tablet (500 mg total) by mouth 2 (two) times daily with a meal.    Dispense:  60 tablet    Refill:  0    30 d supply;  ** OV for RF **   Do not send RF request     1. Type 2 diabetes mellitus with other specified  complication, with long-term current use of insulin (HCC) Good blood sugar control is important to decrease the likelihood of diabetic complications such as nephropathy, neuropathy, limb loss, blindness, coronary artery disease, and death. Intensive lifestyle modification including diet, exercise and weight loss are the first line of treatment for diabetes.  Continue Ozempic. Obtain labs from PCP.  Refill- Semaglutide, 1 MG/DOSE, (OZEMPIC, 1 MG/DOSE,) 2 MG/1.5ML SOPN; Inject 1 mg into the skin once a week.  Dispense: 9 mL; Refill: 0 Refill- metFORMIN (GLUCOPHAGE) 500 MG tablet; Take 1 tablet (500 mg total) by mouth 2 (two) times daily with a meal.  Dispense: 60 tablet; Refill: 0  2. Obesity with current BMI of 52.2  [name] is currently in the action stage of change. As such, her goal is to continue with weight loss efforts. She has agreed to the Category 2 Plan.   Handout: Eating Out Guide and another Category 2 Plan provided  Exercise goals:  Increase as tolerated.  Behavioral modification strategies: increasing lean protein intake, decreasing simple carbohydrates, and no skipping meals.  [name] has agreed to follow-up with our clinic in 2-3 weeks. She was informed of the importance of frequent follow-up visits to maximize her success with intensive lifestyle modifications for her multiple health conditions.   Objective:   Blood pressure 126/82, pulse 63, temperature (!) 97.4 F (  36.3 C), height 5\' 1"  (1.549 m), weight 276 lb (125.2 kg), SpO2 97 %. Body mass index is 52.15 kg/m.  General: Cooperative, alert, well developed, in no acute distress. HEENT: Conjunctivae and lids unremarkable. Cardiovascular: Regular rhythm.  Lungs: Normal work of breathing. Neurologic: No focal deficits.   Lab Results  Component Value Date   CREATININE 0.9 10/27/2020   BUN 30 (A) 10/27/2020   NA 143 10/27/2020   K 4.3 10/27/2020   CL 100 10/27/2020   CO2 34 (A) 10/27/2020   Lab Results   Component Value Date   ALT 14 10/27/2020   AST 14 10/27/2020   ALKPHOS 84 10/27/2020   BILITOT 0.5 05/13/2020   Lab Results  Component Value Date   HGBA1C 8.7 (H) 05/13/2020   HGBA1C 7.5 (H) 02/04/2020   HGBA1C 8.2 (H) 09/18/2019   HGBA1C 11.8 07/22/2009   HGBA1C >14.0 04/09/2009   No results found for: INSULIN Lab Results  Component Value Date   TSH 1.32 10/27/2020   Lab Results  Component Value Date   CHOL 168 10/27/2020   HDL 53 10/27/2020   LDLCALC 77 10/27/2020   LDLDIRECT 66 04/09/2009   TRIG 188 (A) 10/27/2020   CHOLHDL 3.5 05/13/2020   Lab Results  Component Value Date   VD25OH 36.2 10/27/2020   VD25OH 49.3 02/04/2020   VD25OH 107 11/29/2019   Lab Results  Component Value Date   WBC 6.4 10/27/2020   HGB 13.9 10/27/2020   HCT 44 10/27/2020   MCV 85 09/18/2019   PLT 283 10/27/2020   No results found for: IRON, TIBC, FERRITIN  Obesity Behavioral Intervention:   Approximately 15 minutes were spent on the discussion below.  ASK: We discussed the diagnosis of obesity with [name] today and [name] agreed to give [place] permission to discuss obesity behavioral modification therapy today.  ASSESS: [name] has the diagnosis of obesity and her BMI today is 52.2. [name] is in the action stage of change.   ADVISE: [name] was educated on the multiple health risks of obesity as well as the benefit of weight loss to improve her health. She was advised of the need for long term treatment and the importance of lifestyle modifications to improve her current health and to decrease her risk of future health problems.  AGREE: Multiple dietary modification options and treatment options were discussed and [name] agreed to follow the recommendations documented in the above note.  ARRANGE: [name] was educated on the importance of frequent visits to treat obesity as outlined per CMS and USPSTF guidelines and agreed to schedule her next follow up appointment  today.  Attestation Statements:   Reviewed by clinician on day of visit: allergies, medications, problem list, medical history, surgical history, family history, social history, and previous encounter notes.  Coral Ceo, CMA, am acting as transcriptionist for [employer], DO.  I have reviewed the above documentation for accuracy and completeness, and I agree with the above. [name], D.O.  The [place] was signed into law in 2016 which includes the topic of electronic health records.  This provides immediate access to information in MyChart.  This includes consultation notes, operative notes, office notes, lab results and pathology reports.  If you have any questions about what you read please let us know at your next visit so we can discuss your concerns and take corrective action if need be.  We are right here with you.

## 2021-02-19 ENCOUNTER — Telehealth (INDEPENDENT_AMBULATORY_CARE_PROVIDER_SITE_OTHER): Payer: Self-pay

## 2021-02-19 ENCOUNTER — Encounter (INDEPENDENT_AMBULATORY_CARE_PROVIDER_SITE_OTHER): Payer: Self-pay

## 2021-02-19 NOTE — Telephone Encounter (Signed)
Multiple attempts made to contact pt's PCP office and obtain copy of most recent labs. Voicemail was also left requesting a copy of labs with medical records.

## 2021-02-23 ENCOUNTER — Ambulatory Visit (INDEPENDENT_AMBULATORY_CARE_PROVIDER_SITE_OTHER): Payer: Medicare HMO | Admitting: Family Medicine

## 2021-02-23 ENCOUNTER — Encounter (INDEPENDENT_AMBULATORY_CARE_PROVIDER_SITE_OTHER): Payer: Self-pay | Admitting: Family Medicine

## 2021-02-23 ENCOUNTER — Other Ambulatory Visit: Payer: Self-pay

## 2021-02-23 VITALS — BP 116/70 | HR 63 | Temp 98.5°F | Ht 61.0 in | Wt 278.0 lb

## 2021-02-23 DIAGNOSIS — E1169 Type 2 diabetes mellitus with other specified complication: Secondary | ICD-10-CM | POA: Diagnosis not present

## 2021-02-23 DIAGNOSIS — E1159 Type 2 diabetes mellitus with other circulatory complications: Secondary | ICD-10-CM | POA: Diagnosis not present

## 2021-02-23 DIAGNOSIS — E559 Vitamin D deficiency, unspecified: Secondary | ICD-10-CM

## 2021-02-23 DIAGNOSIS — I152 Hypertension secondary to endocrine disorders: Secondary | ICD-10-CM

## 2021-02-23 DIAGNOSIS — Z6841 Body Mass Index (BMI) 40.0 and over, adult: Secondary | ICD-10-CM

## 2021-02-23 DIAGNOSIS — Z794 Long term (current) use of insulin: Secondary | ICD-10-CM

## 2021-02-23 MED ORDER — METFORMIN HCL 500 MG PO TABS: 500.0000 mg | ORAL_TABLET | Freq: Two times a day (BID) | ORAL | 0 refills | Status: DC

## 2021-02-24 LAB — VITAMIN D 25 HYDROXY (VIT D DEFICIENCY, FRACTURES): Vit D, 25-Hydroxy: 38 ng/mL (ref 30.0–100.0)

## 2021-02-24 LAB — HEMOGLOBIN A1C
Est. average glucose Bld gHb Est-mCnc: 177 mg/dL
Hgb A1c MFr Bld: 7.8 % — ABNORMAL HIGH (ref 4.8–5.6)

## 2021-02-24 NOTE — Progress Notes (Signed)
Chief Complaint:   OBESITY [name] is here to discuss her progress with her obesity treatment plan along with follow-up of her obesity related diagnoses. [name] is on the Category 2 Plan and states she is following her eating plan approximately 87% of the time. [name] states she is walking for 15 minutes 3 times per week.  Today's visit was #: 50 Starting weight: 266 lbs Starting date: 09/18/2019 Today's weight: 278 lbs Today's date: 02/23/2021 Total lbs lost to date: 0 Total lbs lost since last in-office visit: +2  Interim History: [name] states that she really tried over the holidays, and she is glad that she only gained 2 lbs.  Per the patient, she dressed with several layers of clothes today to walk this morning with her daughter prior to her appointment.  Endorses she is doing well with the meal plan and states she follows it almost 90% of the time the past few weeks since last OV.  She has no concerns.  She is eating 3 meals per day, but she occasionally skips meals or eats off the plan with her sister when they go out.  Subjective:   1. Type 2 diabetes mellitus with other specified complication, with long-term current use of insulin (HCC) [name]'s fasting blood sugars range between 110-119, with highest at 157. No A1c has been recently done with her primary care physician. Medications reviewed. Diabetic ROS: no polyuria or polydipsia, no chest pain, dyspnea or TIA's, no numbness, tingling or pain in extremities.   2. Vitamin D deficiency [name]'s primary care physician took her off Vit D supplementation approximately a month and a half ago. Her last Vit D check was too low and not at goal, and has not been recently checked by her primary care physician.  3. Hypertension associated with diabetes ([place]) [name]'s blood pressure is at goal for a diabetic. She has no concerns or complaints. Cardiovascular ROS: no chest pain or dyspnea on exertion.  BP Readings from Last 3  Encounters:  02/23/21 116/70  02/02/21 126/82  01/08/21 124/79     Assessment/Plan:   Orders Placed This Encounter  Procedures   Hemoglobin A1c   VITAMIN D 25 Hydroxy (Vit-D Deficiency, Fractures)   Hemoglobin A1c    Medications Discontinued During This Encounter  Medication Reason   meloxicam (MOBIC) 15 MG tablet    metFORMIN (GLUCOPHAGE) 500 MG tablet Reorder   metFORMIN (GLUCOPHAGE) 500 MG tablet      Meds ordered this encounter  Medications   DISCONTD: metFORMIN (GLUCOPHAGE) 500 MG tablet    Sig: Take 1 tablet (500 mg total) by mouth 2 (two) times daily with a meal.    Dispense:  60 tablet    Refill:  0    30 d supply;  ** OV for RF **   Do not send RF request   metFORMIN (GLUCOPHAGE) 500 MG tablet    Sig: Take 1 tablet (500 mg total) by mouth 2 (two) times daily with a meal.    Dispense:  60 tablet    Refill:  0    30 d supply;  ** OV for RF **   Do not send RF request     1. Type 2 diabetes mellitus with other specified complication, with long-term current use of insulin ([place]) Emmaleigh will continue Ozempic and metformin. Will check labs today, and we will refill metformin for 1 month. Good blood sugar control is important to decrease the likelihood of diabetic complications such as nephropathy, neuropathy,  limb loss, blindness, coronary artery disease, and death. Intensive lifestyle modification including diet, exercise and weight loss are the first line of treatment for diabetes.   - metFORMIN (GLUCOPHAGE) 500 MG tablet; Take 1 tablet (500 mg total) by mouth 2 (two) times daily with a meal.  Dispense: 60 tablet; Refill: 0  - Hemoglobin A1c  2. Vitamin D deficiency We will check labs today. [name] will follow-up for routine testing of Vitamin D, at least 2-3 times per year to avoid over-replacement.  - VITAMIN D 25 Hydroxy (Vit-D Deficiency, Fractures)  3. Hypertension associated with diabetes ([place]) [name] will continue her medications, decrease salt in her  diet, increase exercise along with weight loss. She will watch for signs of hypotension as she continues her lifestyle modifications.  4. Obesity with current BMI of 52.6 [name] is currently in the action stage of change. As such, her goal is to continue with weight loss efforts. She has agreed to change to practicing portion control and making smarter food choices, such as increasing vegetables and decreasing simple carbohydrates.   Exercise goals: Increase walking to 30 minutes 3 days per week.  Behavioral modification strategies: decreasing simple carbohydrates and avoiding temptations.  Ayo has agreed to follow-up with our clinic in 2 to 4 weeks. She was informed of the importance of frequent follow-up visits to maximize her success with intensive lifestyle modifications for her multiple health conditions.   [name] was informed we would discuss her lab results at her next visit unless there is a critical issue that needs to be addressed sooner. [name] agreed to keep her next visit at the agreed upon time to discuss these results.  Objective:   Blood pressure 116/70, pulse 63, temperature 98.5 F (36.9 C), height 5\' 1"  (1.549 m), weight 278 lb (126.1 kg), SpO2 99 %. Body mass index is 52.53 kg/m.  General: Cooperative, alert, well developed, in no acute distress. HEENT: Conjunctivae and lids unremarkable. Cardiovascular: Regular rhythm.  Lungs: Normal work of breathing. Neurologic: No focal deficits.   Lab Results  Component Value Date   CREATININE 1.1 01/29/2021   BUN 24 (A) 01/29/2021   NA 140 01/29/2021   K 3.8 01/29/2021   CL 103 01/29/2021   CO2 23 (A) 01/29/2021   Lab Results  Component Value Date   ALT 16 01/29/2021   AST 23 01/29/2021   ALKPHOS 102 01/29/2021   BILITOT 0.5 05/13/2020   Lab Results  Component Value Date   HGBA1C 7.8 (H) 02/23/2021   HGBA1C 8.7 (H) 05/13/2020   HGBA1C 7.5 (H) 02/04/2020   HGBA1C 8.2 (H) 09/18/2019   HGBA1C 11.8  07/22/2009   No results found for: INSULIN Lab Results  Component Value Date   TSH 1.32 10/27/2020   Lab Results  Component Value Date   CHOL 168 10/27/2020   HDL 53 10/27/2020   LDLCALC 77 10/27/2020   LDLDIRECT 66 04/09/2009   TRIG 188 (A) 10/27/2020   CHOLHDL 3.5 05/13/2020   Lab Results  Component Value Date   VD25OH 38.0 02/23/2021   VD25OH 36.2 10/27/2020   VD25OH 49.3 02/04/2020   Lab Results  Component Value Date   WBC 8.7 01/29/2021   HGB 14.1 01/29/2021   HCT 43 01/29/2021   MCV 85 09/18/2019   PLT 212 01/29/2021   No results found for: IRON, TIBC, FERRITIN  Obesity Behavioral Intervention:   Approximately 15 minutes were spent on the discussion below.  ASK: We discussed the diagnosis of obesity with [name] today  and [name] agreed to give [place] permission to discuss obesity behavioral modification therapy today.  ASSESS: Raynetta has the diagnosis of obesity and her BMI today is 52.6. Shanena is in the action stage of change.   ADVISE: Praise was educated on the multiple health risks of obesity as well as the benefit of weight loss to improve her health. She was advised of the need for long term treatment and the importance of lifestyle modifications to improve her current health and to decrease her risk of future health problems.  AGREE: Multiple dietary modification options and treatment options were discussed and [name] agreed to follow the recommendations documented in the above note.  ARRANGE: [name] was educated on the importance of frequent visits to treat obesity as outlined per CMS and USPSTF guidelines and agreed to schedule her next follow up appointment today.  Attestation Statements:   Reviewed by clinician on day of visit: allergies, medications, problem list, medical history, surgical history, family history, social history, and previous encounter notes.   Wilhemena Durie, am acting as transcriptionist for [employer], DO.  I  have reviewed the above documentation for accuracy and completeness, and I agree with the above. Marjory Sneddon, D.O.  The [place] was signed into law in 2016 which includes the topic of electronic health records.  This provides immediate access to information in MyChart.  This includes consultation notes, operative notes, office notes, lab results and pathology reports.  If you have any questions about what you read please let us know at your next visit so we can discuss your concerns and take corrective action if need be.  We are right here with you.

## 2021-03-04 NOTE — Progress Notes (Signed)
Order(s) created erroneously. Erroneous order ID: [id number]  Order moved by: Wyline Copas  Order move date/time: 03/04/2021 8:59 AM  Source Patient: [id number]  Source Contact: 02/19/2021  Destination Patient: [id number]  Destination Contact: 11/08/2019  Erroneous order ID: [id number]  Order moved by: Wyline Copas  Order move date/time: 03/04/2021 8:59 AM  Source Patient: [id number]  Source Contact: 02/19/2021  Destination Patient: [id number]  Destination Contact: 11/08/2019  Erroneous order ID: [id number]  Order moved by: Wyline Copas  Order move date/time: 03/04/2021 8:59 AM  Source Patient: [id number]  Source Contact: 02/19/2021  Destination Patient: [id number]  Destination Contact: 11/08/2019  Erroneous order ID: [id number]  Order moved by: Wyline Copas  Order move date/time: 03/04/2021 8:59 AM  Source Patient: [id number]  Source Contact: 02/19/2021  Destination Patient: [id number]  Destination Contact: 11/08/2019  Erroneous order ID: [id number]  Order moved by: Wyline Copas  Order move date/time: 03/04/2021 8:59 AM  Source Patient: [id number]  Source Contact: 02/19/2021  Destination Patient: [id number]  Destination Contact: 11/08/2019

## 2021-03-09 ENCOUNTER — Other Ambulatory Visit (INDEPENDENT_AMBULATORY_CARE_PROVIDER_SITE_OTHER): Payer: Self-pay | Admitting: Adult Health

## 2021-03-09 DIAGNOSIS — E785 Hyperlipidemia, unspecified: Secondary | ICD-10-CM

## 2021-03-10 ENCOUNTER — Ambulatory Visit (INDEPENDENT_AMBULATORY_CARE_PROVIDER_SITE_OTHER): Payer: Medicare HMO | Admitting: Family Medicine

## 2021-03-10 ENCOUNTER — Other Ambulatory Visit: Payer: Self-pay

## 2021-03-10 ENCOUNTER — Encounter (INDEPENDENT_AMBULATORY_CARE_PROVIDER_SITE_OTHER): Payer: Self-pay | Admitting: Family Medicine

## 2021-03-10 VITALS — BP 130/85 | HR 58 | Temp 98.2°F | Ht 61.0 in | Wt 277.0 lb

## 2021-03-10 DIAGNOSIS — E1169 Type 2 diabetes mellitus with other specified complication: Secondary | ICD-10-CM | POA: Diagnosis not present

## 2021-03-10 DIAGNOSIS — Z6841 Body Mass Index (BMI) 40.0 and over, adult: Secondary | ICD-10-CM

## 2021-03-10 DIAGNOSIS — E559 Vitamin D deficiency, unspecified: Secondary | ICD-10-CM

## 2021-03-10 DIAGNOSIS — E785 Hyperlipidemia, unspecified: Secondary | ICD-10-CM

## 2021-03-10 DIAGNOSIS — Z794 Long term (current) use of insulin: Secondary | ICD-10-CM | POA: Diagnosis not present

## 2021-03-10 MED ORDER — METFORMIN HCL 500 MG PO TABS: 500.0000 mg | ORAL_TABLET | Freq: Two times a day (BID) | ORAL | 0 refills | Status: DC

## 2021-03-10 MED ORDER — OZEMPIC (1 MG/DOSE) 2 MG/1.5ML ~~LOC~~ SOPN: 1.0000 mg | PEN_INJECTOR | SUBCUTANEOUS | 0 refills | Status: DC

## 2021-03-10 MED ORDER — ATORVASTATIN CALCIUM 40 MG PO TABS: 40.0000 mg | ORAL_TABLET | Freq: Every day | ORAL | 0 refills | Status: DC

## 2021-03-10 MED ORDER — ERGOCALCIFEROL 1.25 MG (50000 UT) PO CAPS: 50000.0000 [IU] | ORAL_CAPSULE | ORAL | 0 refills | Status: DC

## 2021-03-10 NOTE — Progress Notes (Signed)
Chief Complaint:   OBESITY [name] is here to discuss her progress with her obesity treatment plan along with follow-up of her obesity related diagnoses. [name] is on practicing portion control and making smarter food choices, such as increasing vegetables and decreasing simple carbohydrates and states she is following her eating plan approximately 85% of the time. [name] states she is not currently exercising.  Today's visit was #: 4 Starting weight: 266 lbs Starting date: 09/18/2019 Today's weight: 277 lbs Today's date: 03/10/2021 Total lbs lost to date: 0 Total lbs lost since last in-office visit: 1  Interim History: Pt's sister has diabetes mellitus and is dying. Hospice was called in and they stopped hemodialysis and all meds, except pain meds. Pt is following category 2, not PC/[place] plan that we put her on last OV. She like the structure.  Subjective:   1. Type 2 diabetes mellitus with other specified complication, with long-term current use of insulin ([name]) Discussed labs with patient today. Pt's A1c has been above 7.0 for 25 years. A1c decreased to 7.8 from 8.7 at last check. Pt has not checked her blood sugar much at all the past few weeks. She is taking 30 units qhs and 50 units qAM of Lantus/insulin. Suboptimally controlled.   2. Hyperlipidemia associated with type 2 diabetes mellitus ([place]) [name]'s LDL is slightly above goal with Lipitor 40 mg.  3. Vitamin D deficiency Discussed labs with patient today. Pt has been off Vit D supplementation for 2 months. Level is suboptimally controlled- goal is 50-70.  Assessment/Plan:  No orders of the defined types were placed in this encounter.   Medications Discontinued During This Encounter  Medication Reason   PEG-KCl-NaCl-NaSulf-Na Asc-C (PLENVU) 140 g SOLR    atorvastatin (LIPITOR) 40 MG tablet Reorder   Semaglutide, 1 MG/DOSE, (OZEMPIC, 1 MG/DOSE,) 2 MG/1.5ML SOPN Reorder   metFORMIN (GLUCOPHAGE) 500 MG tablet  Reorder   ergocalciferol (VITAMIN D2) 1.25 MG (50000 UT) capsule Reorder     Meds ordered this encounter  Medications   atorvastatin (LIPITOR) 40 MG tablet    Sig: Take 1 tablet (40 mg total) by mouth at bedtime.    Dispense:  30 tablet    Refill:  0    90 day supply has not been delivered from mail order pharmacy and pt needs bridge Rx   metFORMIN (GLUCOPHAGE) 500 MG tablet    Sig: Take 1 tablet (500 mg total) by mouth 2 (two) times daily with a meal.    Dispense:  60 tablet    Refill:  0    30 d supply;  ** OV for RF **   Do not send RF request   Semaglutide, 1 MG/DOSE, (OZEMPIC, 1 MG/DOSE,) 2 MG/1.5ML SOPN    Sig: Inject 1 mg into the skin once a week.    Dispense:  3 mL    Refill:  0    30 d supply as we plan on inc dose next OV   ergocalciferol (VITAMIN D2) 1.25 MG (50000 UT) capsule    Sig: Take 1 capsule (50,000 Units total) by mouth once a week.    Dispense:  4 capsule    Refill:  0     1. Type 2 diabetes mellitus with other specified complication, with long-term current use of insulin (HCC) Good blood sugar control is important to decrease the likelihood of diabetic complications such as nephropathy, neuropathy, limb loss, blindness, coronary artery disease, and death. Intensive lifestyle modification including diet, exercise and weight  loss are the first line of treatment for diabetes. Pt declines increasing dose of Ozempic today.  Refill- metFORMIN (GLUCOPHAGE) 500 MG tablet; Take 1 tablet (500 mg total) by mouth 2 (two) times daily with a meal.  Dispense: 60 tablet; Refill: 0 Refill- Semaglutide, 1 MG/DOSE, (OZEMPIC, 1 MG/DOSE,) 2 MG/1.5ML SOPN; Inject 1 mg into the skin once a week.  Dispense: 3 mL; Refill: 0  2. Hyperlipidemia associated with type 2 diabetes mellitus ([place]) Cardiovascular risk and specific lipid/LDL goals reviewed.  We discussed several lifestyle modifications today and [name] will continue to work on diet, exercise and weight loss efforts. Orders and  follow up as documented in patient record. Pt wishes to really work on diet and lifestyle changes and recheck labs in 2-3 months.  Counseling Intensive lifestyle modifications are the first line treatment for this issue. Dietary changes: Increase soluble fiber. Decrease simple carbohydrates. Exercise changes: Moderate to vigorous-intensity aerobic activity 150 minutes per week if tolerated. Lipid-lowering medications: see documented in medical record.  Refill- atorvastatin (LIPITOR) 40 MG tablet; Take 1 tablet (40 mg total) by mouth at bedtime.  Dispense: 30 tablet; Refill: 0  3. Vitamin D deficiency Plan: - Discussed importance of vitamin D to their health and well-being.  - possible symptoms of low Vitamin D can be low energy, depressed mood, muscle aches, joint aches, osteoporosis etc. - low Vitamin D levels may be linked to an increased risk of cardiovascular events and even increased risk of cancers- such as colon and breast.  - I recommend pt take a 50,000 IU weekly prescription vit D - see script below   - Informed patient this may be a lifelong thing, and she was encouraged to continue to take the medicine until told otherwise.   - we will need to monitor levels regularly (every 3-4 mo on average) to keep levels within normal limits.  - weight loss will likely improve availability of vitamin D, thus encouraged [name] to continue with meal plan and their weight loss efforts to further improve this condition - pt's questions and concerns regarding this condition addressed.  Refill- ergocalciferol (VITAMIN D2) 1.25 MG (50000 UT) capsule; Take 1 capsule (50,000 Units total) by mouth once a week.  Dispense: 4 capsule; Refill: 0  4. Obesity with current BMI of 52.3  [name] is currently in the action stage of change. As such, her goal is to continue with weight loss efforts. She has agreed to change back to the Category 2 Plan.   Pt states she's strongly motivated for change because she  doesn't want to end up like her sister.  Exercise goals:  As is, but some walking is better than none.  Behavioral modification strategies: decreasing simple carbohydrates, holiday eating strategies , celebration eating strategies, and avoiding temptations.  [name] has agreed to follow-up with our clinic in 3-4 weeks. She was informed of the importance of frequent follow-up visits to maximize her success with intensive lifestyle modifications for her multiple health conditions.   Objective:   Blood pressure 130/85, pulse (!) 58, temperature 98.2 F (36.8 C), height 5\' 1"  (1.549 m), weight 277 lb (125.6 kg), SpO2 97 %. Body mass index is 52.34 kg/m.  General: Cooperative, alert, well developed, in no acute distress. HEENT: Conjunctivae and lids unremarkable. Cardiovascular: Regular rhythm.  Lungs: Normal work of breathing. Neurologic: No focal deficits.   Lab Results  Component Value Date   CREATININE 0.9 10/27/2020   BUN 30 (A) 10/27/2020   NA 143 10/27/2020  K 4.3 10/27/2020   CL 100 10/27/2020   CO2 34 (A) 10/27/2020   Lab Results  Component Value Date   ALT 14 10/27/2020   AST 14 10/27/2020   ALKPHOS 84 10/27/2020   BILITOT 0.5 05/13/2020   Lab Results  Component Value Date   HGBA1C 7.8 (H) 02/23/2021   HGBA1C 8.7 (H) 05/13/2020   HGBA1C 7.5 (H) 02/04/2020   HGBA1C 8.2 (H) 09/18/2019   HGBA1C 11.8 07/22/2009   No results found for: INSULIN Lab Results  Component Value Date   TSH 1.32 10/27/2020   Lab Results  Component Value Date   CHOL 168 10/27/2020   HDL 53 10/27/2020   LDLCALC 77 10/27/2020   LDLDIRECT 66 04/09/2009   TRIG 188 (A) 10/27/2020   CHOLHDL 3.5 05/13/2020   Lab Results  Component Value Date   VD25OH 38.0 02/23/2021   VD25OH 36.2 10/27/2020   VD25OH 49.3 02/04/2020   Lab Results  Component Value Date   WBC 6.4 10/27/2020   HGB 13.9 10/27/2020   HCT 44 10/27/2020   MCV 85 09/18/2019   PLT 283 10/27/2020   No results found for:  IRON, TIBC, FERRITIN  Obesity Behavioral Intervention:   Approximately 15 minutes were spent on the discussion below.  ASK: We discussed the diagnosis of obesity with [name] today and [name] agreed to give [place] permission to discuss obesity behavioral modification therapy today.  ASSESS: [name] has the diagnosis of obesity and her BMI today is 52.3. [name] is in the action stage of change.   ADVISE: [name] was educated on the multiple health risks of obesity as well as the benefit of weight loss to improve her health. She was advised of the need for long term treatment and the importance of lifestyle modifications to improve her current health and to decrease her risk of future health problems.  AGREE: Multiple dietary modification options and treatment options were discussed and [name] agreed to follow the recommendations documented in the above note.  ARRANGE: [name] was educated on the importance of frequent visits to treat obesity as outlined per CMS and USPSTF guidelines and agreed to schedule her next follow up appointment today.  Attestation Statements:   Reviewed by clinician on day of visit: allergies, medications, problem list, medical history, surgical history, family history, social history, and previous encounter notes.  Coral Ceo, CMA, am acting as transcriptionist for [employer], DO.  I have reviewed the above documentation for accuracy and completeness, and I agree with the above. Marjory Sneddon, D.O.  The [place] was signed into law in 2016 which includes the topic of electronic health records.  This provides immediate access to information in MyChart.  This includes consultation notes, operative notes, office notes, lab results and pathology reports.  If you have any questions about what you read please let us know at your next visit so we can discuss your concerns and take corrective action if need be.  We are right here with  you.

## 2021-03-10 NOTE — Telephone Encounter (Signed)
Dr.Opalski ?

## 2021-03-23 ENCOUNTER — Other Ambulatory Visit (INDEPENDENT_AMBULATORY_CARE_PROVIDER_SITE_OTHER): Payer: Self-pay | Admitting: Family Medicine

## 2021-03-23 DIAGNOSIS — E559 Vitamin D deficiency, unspecified: Secondary | ICD-10-CM

## 2021-03-24 NOTE — Telephone Encounter (Signed)
LOV w/ Dr. Raliegh Scarlet

## 2021-04-02 ENCOUNTER — Ambulatory Visit: Payer: Medicare HMO | Admitting: Orthopaedic Surgery

## 2021-04-04 ENCOUNTER — Other Ambulatory Visit (INDEPENDENT_AMBULATORY_CARE_PROVIDER_SITE_OTHER): Payer: Self-pay | Admitting: Family Medicine

## 2021-04-04 DIAGNOSIS — E1169 Type 2 diabetes mellitus with other specified complication: Secondary | ICD-10-CM

## 2021-04-06 ENCOUNTER — Ambulatory Visit (INDEPENDENT_AMBULATORY_CARE_PROVIDER_SITE_OTHER): Payer: Medicare HMO | Admitting: Family Medicine

## 2021-04-14 ENCOUNTER — Ambulatory Visit (INDEPENDENT_AMBULATORY_CARE_PROVIDER_SITE_OTHER): Payer: Medicare HMO | Admitting: Orthopaedic Surgery

## 2021-04-14 ENCOUNTER — Encounter: Payer: Self-pay | Admitting: Orthopaedic Surgery

## 2021-04-14 VITALS — Ht 61.0 in | Wt 283.0 lb

## 2021-04-14 DIAGNOSIS — M1712 Unilateral primary osteoarthritis, left knee: Secondary | ICD-10-CM

## 2021-04-14 MED ORDER — METHYLPREDNISOLONE ACETATE 40 MG/ML IJ SUSP
40.0000 mg | INTRAMUSCULAR | Status: AC | PRN
  Administered 2021-04-14: 11:00:00 40 mg via INTRA_ARTICULAR

## 2021-04-14 MED ORDER — LIDOCAINE HCL 1 % IJ SOLN
3.0000 mL | INTRAMUSCULAR | Status: AC | PRN
  Administered 2021-04-14: 11:00:00 3 mL

## 2021-04-14 NOTE — Progress Notes (Signed)
Office Visit Note   Patient: [name]           Date of Birth: [birth year]           MRN: [id number] Visit Date: 04/14/2021              Requested by: Nolene Ebbs, MD [address] [place],  [place] [address] PCP: Nolene Ebbs, MD   Assessment & Plan: Visit Diagnoses:  1. Unilateral primary osteoarthritis, left knee     Plan:  Plan she will monitor her glucose levels closely over the next 24 to 48 hours.  She does have insulin is able to adjust her dosage depending on the sliding scale.  We will see her back in just 2 weeks and at that point time most likely inject the right knee.  She tolerated the injection well today in the left knee.  Questions were encouraged and answered.  Follow-Up Instructions: Return in about 2 weeks (around 04/28/2021).   Orders:  Orders Placed This Encounter  Procedures   Large Joint Inj: L knee   No orders of the defined types were placed in this encounter.     Procedures: Large Joint Inj: L knee on 04/14/2021 11:03 AM Indications: pain Details: 22 G 1.5 in needle, anterolateral approach  Arthrogram: No  Medications: 3 mL lidocaine 1 %; 40 mg methylPREDNISolone acetate 40 MG/ML Outcome: tolerated well, no immediate complications Procedure, treatment alternatives, risks and benefits explained, specific risks discussed. Consent was given by the patient. Immediately prior to procedure a time out was called to verify the correct patient, procedure, equipment, support staff and site/side marked as required. Patient was prepped and draped in the usual sterile fashion.      Clinical Data: No additional findings.   Subjective: Chief Complaint  Patient presents with   Left Knee - Pain   Right Knee - Pain    HPI Mrs. [name] pleasant 61 year old female comes in today wanting bilateral knee injections.  She has known end-stage arthritis of both knees.  Was last seen on 09/30/2020 and was given cortisone injections.  She states that  these helped until about 2 months ago.  She states after the injection she was able to walk 3 days a week for exercise.  She is taking Mobic and Tylenol for her knee pain.  She reports that her last hemoglobin A1c was 7.8.  She notes that her glucose levels did go up quite a bit after the last injections in July and states she felt quite dizzy.  Review of Systems Negative for fevers or chills  Objective: Vital Signs: Ht 5\' 1"  (1.549 m)    Wt 283 lb (128.4 kg)    BMI 53.47 kg/m   Physical Exam General: Well-developed well-nourished female no acute distress mood and affect appropriate Psych: Alert and oriented x3 Ortho Exam Left knee full extension full flexion.  Patellofemoral crepitus.  No abnormal warmth or erythema or effusion.  No instability valgus varus stressing. Specialty Comments:  No specialty comments available.  Imaging: No results found.   PMFS History: Patient Active Problem List   Diagnosis Date Noted   Benign neoplasm of colon    Depression 06/02/2020   Other chronic pain 05/15/2020   SOB (shortness of breath) 04/05/2020   Hyperlipidemia associated with type 2 diabetes mellitus ([place]) 02/04/2020   Vitamin D deficiency 01/16/2020   Encounter for screening colonoscopy 01/16/2020   Major depressive disorder, recurrent episode, mild ([place]) 01/12/2020   Hypertension associated with type  2 diabetes mellitus ([place]) 01/02/2020   BMI 50.0-59.9, adult ([place]) 05/21/2019   Chronic pain of left knee 05/21/2019   Chronic pain of right knee 05/21/2019   Unilateral primary osteoarthritis, left knee 05/21/2019   Unilateral primary osteoarthritis, right knee 05/21/2019   ACROCHORDON 11/12/2009   PERIPHERAL EDEMA 08/01/2009   Pain in joint, lower leg 07/07/2009   SINUS TACHYCARDIA 04/09/2009   BACK PAIN, LUMBOSACRAL, CHRONIC 06/17/2008   SHOULDER PAIN, RIGHT, CHRONIC 05/21/2008   PERIPHERAL NEUROPATHY, MILD 03/05/2008   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   Diabetes mellitus ([place])  05/19/2006   HYPERCHOLESTEROLEMIA 05/19/2006   OBESITY, NOS 05/19/2006   DEPRESSIVE DISORDER, NOS 05/19/2006   HYPERTENSION, BENIGN SYSTEMIC 05/19/2006   RHINITIS, ALLERGIC 05/19/2006   GASTROESOPHAGEAL REFLUX, NO ESOPHAGITIS 05/19/2006   Past Medical History:  Diagnosis Date   Acid reflux    Arthritis    Back pain    Depression    Diabetes mellitus without complication (HCC)    Edema, lower extremity    Glaucoma    High cholesterol    Hypertension    Joint pain    Nerve damage    Shortness of breath    Vitamin D deficiency     Family History  Problem Relation Age of Onset   Obesity Mother    Eating disorder Mother    Hypertension Father    Hyperlipidemia Father    Alcoholism Father     Past Surgical History:  Procedure Laterality Date   btl     CHOLECYSTECTOMY  2001   COLONOSCOPY WITH PROPOFOL N/A 01/06/2021   Procedure: COLONOSCOPY WITH PROPOFOL;  Surgeon: Yetta Flock, MD;  Location: [facility];  Service: Gastroenterology;  Laterality: N/A;   POLYPECTOMY  01/06/2021   Procedure: POLYPECTOMY;  Surgeon: Yetta Flock, MD;  Location: [facility];  Service: Gastroenterology;;   Social History   Occupational History   Occupation: [employer]  Tobacco Use   Smoking status: Former    Packs/day: 1.00    Types: Cigarettes    Quit date: 2001    Years since quitting: 22.0   Smokeless tobacco: Never  Substance and Sexual Activity   Alcohol use: No   Drug use: No   Sexual activity: Not on file

## 2021-04-15 ENCOUNTER — Ambulatory Visit
Admission: RE | Admit: 2021-04-15 | Discharge: 2021-04-15 | Disposition: A | Discharge status: Home / Self Care | Payer: Medicare HMO | Source: Ambulatory Visit | Attending: Internal Medicine | Admitting: Internal Medicine

## 2021-04-15 ENCOUNTER — Other Ambulatory Visit: Payer: Self-pay

## 2021-04-15 DIAGNOSIS — Z1231 Encounter for screening mammogram for malignant neoplasm of breast: Secondary | ICD-10-CM

## 2021-04-17 ENCOUNTER — Other Ambulatory Visit: Payer: Self-pay | Admitting: Internal Medicine

## 2021-04-17 DIAGNOSIS — R928 Other abnormal and inconclusive findings on diagnostic imaging of breast: Secondary | ICD-10-CM

## 2021-04-27 ENCOUNTER — Encounter (INDEPENDENT_AMBULATORY_CARE_PROVIDER_SITE_OTHER): Payer: Self-pay | Admitting: Family Medicine

## 2021-04-27 ENCOUNTER — Encounter: Payer: Self-pay | Admitting: Orthopaedic Surgery

## 2021-04-27 ENCOUNTER — Ambulatory Visit (INDEPENDENT_AMBULATORY_CARE_PROVIDER_SITE_OTHER): Payer: Medicare HMO | Admitting: Family Medicine

## 2021-04-27 ENCOUNTER — Ambulatory Visit (INDEPENDENT_AMBULATORY_CARE_PROVIDER_SITE_OTHER): Payer: Medicare HMO | Admitting: Orthopaedic Surgery

## 2021-04-27 ENCOUNTER — Other Ambulatory Visit: Payer: Self-pay

## 2021-04-27 VITALS — BP 116/75 | HR 68 | Temp 98.3°F | Ht 61.0 in | Wt 270.0 lb

## 2021-04-27 DIAGNOSIS — E559 Vitamin D deficiency, unspecified: Secondary | ICD-10-CM | POA: Diagnosis not present

## 2021-04-27 DIAGNOSIS — M25561 Pain in right knee: Secondary | ICD-10-CM | POA: Diagnosis not present

## 2021-04-27 DIAGNOSIS — Z7985 Long-term (current) use of injectable non-insulin antidiabetic drugs: Secondary | ICD-10-CM

## 2021-04-27 DIAGNOSIS — E1169 Type 2 diabetes mellitus with other specified complication: Secondary | ICD-10-CM

## 2021-04-27 DIAGNOSIS — E1159 Type 2 diabetes mellitus with other circulatory complications: Secondary | ICD-10-CM

## 2021-04-27 DIAGNOSIS — M1711 Unilateral primary osteoarthritis, right knee: Secondary | ICD-10-CM

## 2021-04-27 DIAGNOSIS — I152 Hypertension secondary to endocrine disorders: Secondary | ICD-10-CM | POA: Diagnosis not present

## 2021-04-27 DIAGNOSIS — M25562 Pain in left knee: Secondary | ICD-10-CM | POA: Diagnosis not present

## 2021-04-27 DIAGNOSIS — G8929 Other chronic pain: Secondary | ICD-10-CM | POA: Diagnosis not present

## 2021-04-27 DIAGNOSIS — Z6841 Body Mass Index (BMI) 40.0 and over, adult: Secondary | ICD-10-CM

## 2021-04-27 DIAGNOSIS — E669 Obesity, unspecified: Secondary | ICD-10-CM | POA: Diagnosis not present

## 2021-04-27 DIAGNOSIS — M1712 Unilateral primary osteoarthritis, left knee: Secondary | ICD-10-CM

## 2021-04-27 DIAGNOSIS — Z794 Long term (current) use of insulin: Secondary | ICD-10-CM

## 2021-04-27 DIAGNOSIS — E785 Hyperlipidemia, unspecified: Secondary | ICD-10-CM

## 2021-04-27 MED ORDER — ERGOCALCIFEROL 1.25 MG (50000 UT) PO CAPS: 50000.0000 [IU] | ORAL_CAPSULE | ORAL | 0 refills | Status: DC

## 2021-04-27 MED ORDER — METHYLPREDNISOLONE ACETATE 40 MG/ML IJ SUSP
40.0000 mg | INTRAMUSCULAR | Status: AC | PRN
  Administered 2021-04-27: 40 mg via INTRA_ARTICULAR

## 2021-04-27 MED ORDER — SEMAGLUTIDE (2 MG/DOSE) 8 MG/3ML ~~LOC~~ SOPN: 2.0000 mg | PEN_INJECTOR | SUBCUTANEOUS | 0 refills | Status: DC

## 2021-04-27 MED ORDER — LIDOCAINE HCL 1 % IJ SOLN
3.0000 mL | INTRAMUSCULAR | Status: AC | PRN
  Administered 2021-04-27: 3 mL

## 2021-04-27 MED ORDER — ATORVASTATIN CALCIUM 40 MG PO TABS: 40.0000 mg | ORAL_TABLET | Freq: Every day | ORAL | 0 refills | Status: AC

## 2021-04-27 MED ORDER — METFORMIN HCL 500 MG PO TABS: 500.0000 mg | ORAL_TABLET | Freq: Two times a day (BID) | ORAL | 0 refills | Status: DC

## 2021-04-27 NOTE — Progress Notes (Signed)
Office Visit Note   Patient: [name]           Date of Birth: 1960-07-02           MRN: [id number] Visit Date: 04/27/2021              Requested by: Nolene Ebbs, MD [address] [place],  [place] [address] PCP: Nolene Ebbs, MD   Assessment & Plan: Visit Diagnoses:  1. Unilateral primary osteoarthritis, left knee   2. Chronic pain of right knee   3. Chronic pain of left knee   4. Unilateral primary osteoarthritis, right knee   5. BMI 50.0-59.9, adult ([place])     Plan: I again counseled her about the detrimental effect steroids can have on someone who is a diabetic.  She did tolerate a steroid injection today in her right knee.  I have told her to wait at least 4 months between steroid injections.  Anytime she does come in we need a weight and BMI calculation.  All questions and concerns were answered and addressed.  Follow-Up Instructions: Return if symptoms worsen or fail to improve.   Orders:  Orders Placed This Encounter  Procedures   Large Joint Inj   No orders of the defined types were placed in this encounter.     Procedures: Large Joint Inj: R knee on 04/27/2021 9:57 AM Indications: diagnostic evaluation and pain Details: 22 G 1.5 in needle, superolateral approach  Arthrogram: No  Medications: 3 mL lidocaine 1 %; 40 mg methylPREDNISolone acetate 40 MG/ML Outcome: tolerated well, no immediate complications Procedure, treatment alternatives, risks and benefits explained, specific risks discussed. Consent was given by the patient. Immediately prior to procedure a time out was called to verify the correct patient, procedure, equipment, support staff and site/side marked as required. Patient was prepped and draped in the usual sterile fashion.      Clinical Data: No additional findings.   Subjective: Chief Complaint  Patient presents with   Left Knee - Follow-up  The patient has known osteoarthritis in both of her knees.  2 weeks ago she did  have a steroid injection in her left knee we wanted to delay a sterile injection of the right knee given her hemoglobin A1c of 7.8.  She is also morbidly obese with a BMI of 51.  She said the steroid injection did help her left knee and she is lost about 8 pounds.  She states she is walking better.  She is requesting a steroid injection in her right knee today.  Again, she understands that this can definitely affect  HPI  Review of Systems Today she denies any fever, chills, nausea, vomiting  Objective: Vital Signs: There were no vitals taken for this visit.  Physical Exam She is alert and orient x3 and in no acute distress Ortho Exam Examination of both knees shows varus malalignment and patellofemoral pain with medial pain as well.  There is no instability on exam.  She does have very large thighs and truncal obesity. Specialty Comments:  No specialty comments available.  Imaging: No results found.   PMFS History: Patient Active Problem List   Diagnosis Date Noted   Benign neoplasm of colon    Depression 06/02/2020   Other chronic pain 05/15/2020   SOB (shortness of breath) 04/05/2020   Hyperlipidemia associated with type 2 diabetes mellitus ([place]) 02/04/2020   Vitamin D deficiency 01/16/2020   Encounter for screening colonoscopy 01/16/2020   Major depressive disorder, recurrent episode, mild (  [place]) 01/12/2020   Hypertension associated with type 2 diabetes mellitus ([place]) 01/02/2020   BMI 50.0-59.9, adult ([place]) 05/21/2019   Chronic pain of left knee 05/21/2019   Chronic pain of right knee 05/21/2019   Unilateral primary osteoarthritis, left knee 05/21/2019   Unilateral primary osteoarthritis, right knee 05/21/2019   ACROCHORDON 11/12/2009   PERIPHERAL EDEMA 08/01/2009   Pain in joint, lower leg 07/07/2009   SINUS TACHYCARDIA 04/09/2009   BACK PAIN, LUMBOSACRAL, CHRONIC 06/17/2008   SHOULDER PAIN, RIGHT, CHRONIC 05/21/2008   PERIPHERAL NEUROPATHY, MILD 03/05/2008   ROTATOR  CUFF SYNDROME 10/03/2007   Diabetes mellitus ([place]) 05/19/2006   HYPERCHOLESTEROLEMIA 05/19/2006   OBESITY, NOS 05/19/2006   DEPRESSIVE DISORDER, NOS 05/19/2006   HYPERTENSION, BENIGN SYSTEMIC 05/19/2006   RHINITIS, ALLERGIC 05/19/2006   GASTROESOPHAGEAL REFLUX, NO ESOPHAGITIS 05/19/2006   Past Medical History:  Diagnosis Date   Acid reflux    Arthritis    Back pain    Depression    Diabetes mellitus without complication (HCC)    Edema, lower extremity    Glaucoma    High cholesterol    Hypertension    Joint pain    Nerve damage    Shortness of breath    Vitamin D deficiency     Family History  Problem Relation Age of Onset   Obesity Mother    Eating disorder Mother    Hypertension Father    Hyperlipidemia Father    Alcoholism Father     Past Surgical History:  Procedure Laterality Date   btl     CHOLECYSTECTOMY  2001   COLONOSCOPY WITH PROPOFOL N/A 01/06/2021   Procedure: COLONOSCOPY WITH PROPOFOL;  Surgeon: Yetta Flock, MD;  Location: [facility];  Service: Gastroenterology;  Laterality: N/A;   POLYPECTOMY  01/06/2021   Procedure: POLYPECTOMY;  Surgeon: Yetta Flock, MD;  Location: [facility];  Service: Gastroenterology;;   Social History   Occupational History   Occupation: [employer]  Tobacco Use   Smoking status: Former    Packs/day: 1.00    Types: Cigarettes    Quit date: 2001    Years since quitting: 22.1   Smokeless tobacco: Never  Substance and Sexual Activity   Alcohol use: No   Drug use: No   Sexual activity: Not on file

## 2021-04-28 NOTE — Progress Notes (Signed)
Chief Complaint:   OBESITY [name] is here to discuss her progress with her obesity treatment plan along with follow-up of her obesity related diagnoses. [name] is on the Category 2 Plan with breakfast options and states she is following her eating plan approximately 80% of the time. [name] states she is walking 30 minutes 3 times per week.  Today's visit was #: 28 Starting weight: 266 lbs Starting date: 09/18/2019 Today's weight: 270 lbs Today's date: 04/27/2021 Total lbs lost to date: 0 Total lbs lost since last in-office visit: 7  Interim History: Due to losing her sister, pt feels more motivated to get healthier so she doesn't end up with health issues her sister had. Her last OV was 03/10/2021. Pt has been following meal plan more closely. She does not eat red meat now and denies hunger pr cravings.  Subjective:   1. Type 2 diabetes mellitus with other specified complication, with long-term current use of insulin (HCC) [name] is on Ozempic and tolerating it well. She declines need for dose adjustments. FBS= 113. Medication: Lantus 50 units QAM and 30 units QPM.  2. Hyperlipidemia associated with type 2 diabetes mellitus ([name]) [name] is tolerating medication(s) well without side effects.  Medication compliance is good and patient appears to be taking it as prescribed.  The patient denies additional concerns regarding this condition. Medication: Lipitor  3. Hypertension associated with type 2 diabetes mellitus (HCC) BP 115/78 at home. Pt denies symptoms or concerns.  4. Vitamin D deficiency She is currently taking prescription vitamin D 50,000 IU each week. She denies nausea, vomiting or muscle weakness.    Assessment/Plan:   Orders Placed This Encounter  Procedures   Hemoglobin A1c   Insulin, random   TSH   T4, free   Comprehensive metabolic panel   Lipid Panel With LDL/HDL Ratio   VITAMIN D 25 Hydroxy (Vit-D Deficiency, Fractures)    Medications Discontinued  During This Encounter  Medication Reason   Semaglutide, 1 MG/DOSE, (OZEMPIC, 1 MG/DOSE,) 2 MG/1.5ML SOPN Dose change   atorvastatin (LIPITOR) 40 MG tablet Reorder   metFORMIN (GLUCOPHAGE) 500 MG tablet Reorder   ergocalciferol (VITAMIN D2) 1.25 MG (50000 UT) capsule Reorder   ergocalciferol (VITAMIN D2) 1.25 MG (50000 UT) capsule      Meds ordered this encounter  Medications   atorvastatin (LIPITOR) 40 MG tablet    Sig: Take 1 tablet (40 mg total) by mouth at bedtime.    Dispense:  90 tablet    Refill:  0    90 day supply has not been delivered from mail order pharmacy and pt needs bridge Rx   DISCONTD: ergocalciferol (VITAMIN D2) 1.25 MG (50000 UT) capsule    Sig: Take 1 capsule (50,000 Units total) by mouth once a week.    Dispense:  4 capsule    Refill:  0    30 d supply;  ** OV for RF **   Do not send RF request   metFORMIN (GLUCOPHAGE) 500 MG tablet    Sig: Take 1 tablet (500 mg total) by mouth 2 (two) times daily with a meal.    Dispense:  180 tablet    Refill:  0    30 d supply;  ** OV for RF **   Do not send RF request   Semaglutide, 2 MG/DOSE, 8 MG/3ML SOPN    Sig: Inject 2 mg as directed once a week.    Dispense:  9 mL    Refill:  0  90 d supply;  ** OV for RF **   Do not send RF request   ergocalciferol (VITAMIN D2) 1.25 MG (50000 UT) capsule    Sig: Take 1 capsule (50,000 Units total) by mouth once a week.    Dispense:  4 capsule    Refill:  0     1. Type 2 diabetes mellitus with other specified complication, with long-term current use of insulin (HCC) Increase Ozempic to 2 mg weekly. Continue all other diabetes meds and continue to ween Lantus. Hypoglycemia prevention discussed with pt. Check labs today.  Refill- metFORMIN (GLUCOPHAGE) 500 MG tablet; Take 1 tablet (500 mg total) by mouth 2 (two) times daily with a meal.  Dispense: 180 tablet; Refill: 0  Increase & Refill- Semaglutide, 2 MG/DOSE, 8 MG/3ML SOPN; Inject 2 mg as directed once a week.  Dispense: 9  mL; Refill: 0  - Hemoglobin A1c - Insulin, random - TSH - T4, free  2. Hyperlipidemia associated with type 2 diabetes mellitus ([name]) Cardiovascular risk and specific lipid/LDL goals reviewed.  We discussed several lifestyle modifications today and [name] will continue to work on diet, exercise and weight loss efforts. Orders and follow up as documented in patient record. Check labs in the near future.  Counseling Intensive lifestyle modifications are the first line treatment for this issue. Dietary changes: Increase soluble fiber. Decrease simple carbohydrates. Exercise changes: Moderate to vigorous-intensity aerobic activity 150 minutes per week if tolerated. Lipid-lowering medications: see documented in medical record.  Refill- atorvastatin (LIPITOR) 40 MG tablet; Take 1 tablet (40 mg total) by mouth at bedtime.  Dispense: 90 tablet; Refill: 0  - Comprehensive metabolic panel - Lipid Panel With LDL/HDL Ratio  3. Hypertension associated with type 2 diabetes mellitus (HCC) BP at goal. Continue meds per PCP. Decrease salt intake and increase activity.  4. Vitamin D deficiency Low Vitamin D level contributes to fatigue and are associated with obesity, breast, and colon cancer. She agrees to continue to take prescription Vitamin D @50 ,000 IU every week and will follow-up for routine testing of Vitamin D, at least 2-3 times per year to avoid over-replacement. Check labs today.  Refill- ergocalciferol (VITAMIN D2) 1.25 MG (50000 UT) capsule; Take 1 capsule (50,000 Units total) by mouth once a week.  Dispense: 4 capsule; Refill: 0  - VITAMIN D 25 Hydroxy (Vit-D Deficiency, Fractures)  5. Obesity with current BMI of 51.0 [name] is currently in the action stage of change. As such, her goal is to continue with weight loss efforts. She has agreed to the Category 2 Plan with breakfast options.   Exercise goals:  As is, per ortho with treatment of her knee OA.  Behavioral modification  strategies: increasing lean protein intake, decreasing simple carbohydrates, no skipping meals, and planning for success.  [name] has agreed to follow-up with our clinic in 2-3 weeks (pt will come fasting for labs this week). She was informed of the importance of frequent follow-up visits to maximize her success with intensive lifestyle modifications for her multiple health conditions.   [name] was informed we would discuss her lab results at her next visit unless there is a critical issue that needs to be addressed sooner. [name] agreed to keep her next visit at the agreed upon time to discuss these results.  Objective:   Blood pressure 116/75, pulse 68, temperature 98.3 F (36.8 C), height 5\' 1"  (1.549 m), weight 270 lb (122.5 kg), SpO2 98 %. Body mass index is 51.02 kg/m.  General: Cooperative, alert,  well developed, in no acute distress. HEENT: Conjunctivae and lids unremarkable. Cardiovascular: Regular rhythm.  Lungs: Normal work of breathing. Neurologic: No focal deficits.   Lab Results  Component Value Date   CREATININE 0.9 10/27/2020   BUN 30 (A) 10/27/2020   NA 143 10/27/2020   K 4.3 10/27/2020   CL 100 10/27/2020   CO2 34 (A) 10/27/2020   Lab Results  Component Value Date   ALT 14 10/27/2020   AST 14 10/27/2020   ALKPHOS 84 10/27/2020   BILITOT 0.5 05/13/2020   Lab Results  Component Value Date   HGBA1C 7.8 (H) 02/23/2021   HGBA1C 8.7 (H) 05/13/2020   HGBA1C 7.5 (H) 02/04/2020   HGBA1C 8.2 (H) 09/18/2019   HGBA1C 11.8 07/22/2009   No results found for: INSULIN Lab Results  Component Value Date   TSH 1.32 10/27/2020   Lab Results  Component Value Date   CHOL 168 10/27/2020   HDL 53 10/27/2020   LDLCALC 77 10/27/2020   LDLDIRECT 66 04/09/2009   TRIG 188 (A) 10/27/2020   CHOLHDL 3.5 05/13/2020   Lab Results  Component Value Date   VD25OH 38.0 02/23/2021   VD25OH 36.2 10/27/2020   VD25OH 49.3 02/04/2020   Lab Results  Component Value Date    WBC 6.4 10/27/2020   HGB 13.9 10/27/2020   HCT 44 10/27/2020   MCV 85 09/18/2019   PLT 283 10/27/2020   No results found for: IRON, TIBC, FERRITIN  Obesity Behavioral Intervention:   Approximately 15 minutes were spent on the discussion below.  ASK: We discussed the diagnosis of obesity with [name] today and [name] agreed to give [place] permission to discuss obesity behavioral modification therapy today.  ASSESS: [name] has the diagnosis of obesity and her BMI today is 51.0. [name] is in the action stage of change.   ADVISE: [name] was educated on the multiple health risks of obesity as well as the benefit of weight loss to improve her health. She was advised of the need for long term treatment and the importance of lifestyle modifications to improve her current health and to decrease her risk of future health problems.  AGREE: Multiple dietary modification options and treatment options were discussed and Jahzara agreed to follow the recommendations documented in the above note.  ARRANGE: [name] was educated on the importance of frequent visits to treat obesity as outlined per CMS and USPSTF guidelines and agreed to schedule her next follow up appointment today.  Attestation Statements:   Reviewed by clinician on day of visit: allergies, medications, problem list, medical history, surgical history, family history, social history, and previous encounter notes.  Coral Ceo, CMA, am acting as transcriptionist for [employer], DO.  I have reviewed the above documentation for accuracy and completeness, and I agree with the above. Marjory Sneddon, D.O.  The [place] was signed into law in 2016 which includes the topic of electronic health records.  This provides immediate access to information in MyChart.  This includes consultation notes, operative notes, office notes, lab results and pathology reports.  If you have any questions about what you read  please let us know at your next visit so we can discuss your concerns and take corrective action if need be.  We are right here with you.

## 2021-04-30 LAB — LIPID PANEL WITH LDL/HDL RATIO
Cholesterol, Total: 127 mg/dL (ref 100–199)
HDL: 45 mg/dL (ref >39)
LDL Chol Calc (NIH): 62 mg/dL (ref 0–99)
LDL/HDL Ratio: 1.4 ratio (ref 0.0–3.2)
Triglycerides: 107 mg/dL (ref 0–149)
VLDL Cholesterol Cal: 20 mg/dL (ref 5–40)

## 2021-04-30 LAB — COMPREHENSIVE METABOLIC PANEL
ALT: 14 IU/L (ref 0–32)
AST: 13 IU/L (ref 0–40)
Albumin/Globulin Ratio: 1.2 (ref 1.2–2.2)
Albumin: 4.2 g/dL (ref 3.8–4.8)
Alkaline Phosphatase: 101 IU/L (ref 44–121)
BUN/Creatinine Ratio: 25 (ref 12–28)
BUN: 27 mg/dL (ref 8–27)
Bilirubin Total: 0.5 mg/dL (ref 0.0–1.2)
CO2: 24 mmol/L (ref 20–29)
Calcium: 9.6 mg/dL (ref 8.7–10.3)
Chloride: 101 mmol/L (ref 96–106)
Creatinine, Ser: 1.08 mg/dL — ABNORMAL HIGH (ref 0.57–1.00)
Globulin, Total: 3.4 g/dL (ref 1.5–4.5)
Glucose: 90 mg/dL (ref 70–99)
Potassium: 4.4 mmol/L (ref 3.5–5.2)
Sodium: 144 mmol/L (ref 134–144)
Total Protein: 7.6 g/dL (ref 6.0–8.5)
eGFR: 58 mL/min/{1.73_m2} — ABNORMAL LOW (ref >59)

## 2021-04-30 LAB — HEMOGLOBIN A1C
Est. average glucose Bld gHb Est-mCnc: 169 mg/dL
Hgb A1c MFr Bld: 7.5 % — ABNORMAL HIGH (ref 4.8–5.6)

## 2021-04-30 LAB — INSULIN, RANDOM: INSULIN: 10.1 u[IU]/mL (ref 2.6–24.9)

## 2021-04-30 LAB — T4, FREE: Free T4: 1.24 ng/dL (ref 0.82–1.77)

## 2021-04-30 LAB — TSH: TSH: 1.32 u[IU]/mL (ref 0.450–4.500)

## 2021-04-30 LAB — VITAMIN D 25 HYDROXY (VIT D DEFICIENCY, FRACTURES): Vit D, 25-Hydroxy: 38.8 ng/mL (ref 30.0–100.0)

## 2021-05-11 ENCOUNTER — Ambulatory Visit (HOSPITAL_COMMUNITY): Payer: Medicare HMO

## 2021-05-11 ENCOUNTER — Ambulatory Visit: Admission: RE | Admit: 2021-05-11 | Payer: Medicare HMO | Source: Ambulatory Visit

## 2021-05-11 ENCOUNTER — Ambulatory Visit
Admission: RE | Admit: 2021-05-11 | Discharge: 2021-05-11 | Disposition: A | Discharge status: Home / Self Care | Payer: Medicare HMO | Source: Ambulatory Visit | Attending: Internal Medicine | Admitting: Internal Medicine

## 2021-05-11 DIAGNOSIS — R928 Other abnormal and inconclusive findings on diagnostic imaging of breast: Secondary | ICD-10-CM

## 2021-05-19 ENCOUNTER — Ambulatory Visit: Payer: Medicare HMO

## 2021-05-19 ENCOUNTER — Other Ambulatory Visit: Payer: Self-pay

## 2021-05-19 ENCOUNTER — Ambulatory Visit
Admission: RE | Admit: 2021-05-19 | Discharge: 2021-05-19 | Disposition: A | Discharge status: Home / Self Care | Payer: Medicare HMO | Source: Ambulatory Visit | Attending: Internal Medicine | Admitting: Internal Medicine

## 2021-05-22 ENCOUNTER — Other Ambulatory Visit (INDEPENDENT_AMBULATORY_CARE_PROVIDER_SITE_OTHER): Payer: Self-pay | Admitting: Family Medicine

## 2021-05-22 DIAGNOSIS — E559 Vitamin D deficiency, unspecified: Secondary | ICD-10-CM

## 2021-05-25 ENCOUNTER — Other Ambulatory Visit: Payer: Self-pay

## 2021-05-25 ENCOUNTER — Ambulatory Visit (INDEPENDENT_AMBULATORY_CARE_PROVIDER_SITE_OTHER): Payer: Medicare HMO | Admitting: Family Medicine

## 2021-05-25 ENCOUNTER — Encounter (INDEPENDENT_AMBULATORY_CARE_PROVIDER_SITE_OTHER): Payer: Self-pay | Admitting: Family Medicine

## 2021-05-25 VITALS — BP 112/70 | HR 68 | Temp 98.0°F | Ht 61.0 in | Wt 270.0 lb

## 2021-05-25 DIAGNOSIS — E1159 Type 2 diabetes mellitus with other circulatory complications: Secondary | ICD-10-CM | POA: Diagnosis not present

## 2021-05-25 DIAGNOSIS — E559 Vitamin D deficiency, unspecified: Secondary | ICD-10-CM

## 2021-05-25 DIAGNOSIS — Z7984 Long term (current) use of oral hypoglycemic drugs: Secondary | ICD-10-CM

## 2021-05-25 DIAGNOSIS — Z6841 Body Mass Index (BMI) 40.0 and over, adult: Secondary | ICD-10-CM

## 2021-05-25 DIAGNOSIS — I152 Hypertension secondary to endocrine disorders: Secondary | ICD-10-CM

## 2021-05-25 DIAGNOSIS — Z794 Long term (current) use of insulin: Secondary | ICD-10-CM

## 2021-05-25 DIAGNOSIS — E785 Hyperlipidemia, unspecified: Secondary | ICD-10-CM | POA: Diagnosis not present

## 2021-05-25 DIAGNOSIS — Z9189 Other specified personal risk factors, not elsewhere classified: Secondary | ICD-10-CM

## 2021-05-25 DIAGNOSIS — E669 Obesity, unspecified: Secondary | ICD-10-CM

## 2021-05-25 DIAGNOSIS — E1169 Type 2 diabetes mellitus with other specified complication: Secondary | ICD-10-CM | POA: Diagnosis not present

## 2021-05-25 MED ORDER — METFORMIN HCL 500 MG PO TABS: 500.0000 mg | ORAL_TABLET | Freq: Two times a day (BID) | ORAL | 0 refills | Status: DC

## 2021-05-25 MED ORDER — ERGOCALCIFEROL 1.25 MG (50000 UT) PO CAPS: ORAL_CAPSULE | ORAL | 0 refills | Status: DC

## 2021-05-25 NOTE — Telephone Encounter (Signed)
Dr.Opalski ?

## 2021-05-26 NOTE — Progress Notes (Signed)
Chief Complaint:   OBESITY [name] is here to discuss her progress with her obesity treatment plan along with follow-up of her obesity related diagnoses. [name] is on the Category 2 Plan with breakfast options and states she is following her eating plan approximately 90% of the time. [name] states she is walking for 60 minutes 3 times per week.  Today's visit was #: 6 Starting weight: 266 lbs Starting date: 09/18/2019 Today's weight: 270 lbs Today's date: 05/25/2021 Total lbs lost to date: 0 Total lbs lost since last in-office visit: 0  Interim History: [name] says that Ortho did another shot in her other knee.  She is feeling better and is able to move more.  She endorses 90% adherence to the meal plan.  Denies hunger or cravings.  He is here to review labs.  Subjective:   1. Type 2 diabetes mellitus with other specified complication, with long-term current use of insulin ([place]) Discussed labs with patient today.  At last office visit, we increased Ozempic to 2 mg, tolerating well.  She says it helps with hunger, eating less of off-plan foods.  FBS 108, lowest 80, and highest 124.  Her A1c is better than prior at 7.5.  Medications:  metformin, glucotrol, Invokana, Ozempic, Lantus 50 QAM, 30 QHS.  2. Hypertension associated with type 2 diabetes mellitus ([place]) Discussed labs with patient today.  BP at goal.  Creatinine elevated with high Na and BUN.  BP Readings from Last 3 Encounters:  05/25/21 112/70  04/27/21 116/75  03/10/21 130/85   3. Hyperlipidemia associated with type 2 diabetes mellitus ([place]) Discussed labs with patient today.  Triglycerides have decreased since starting the program and eating prudent nutritional plan.  Improving.  LDL at goal.  She is taking Lipitor 40 mg daily.  4. Vitamin D deficiency Discussed labs with patient today.  She is taking ergocalciferol once weekly on Thursday.  She never forgets.  5. At risk for heart disease [name] is at higher  than average risk for cardiovascular disease due to diabetes, hyperlipidemia, and hypertension.  Assessment/Plan:   1. Type 2 No orders of the defined types were placed in this encounter.   Medications Discontinued During This Encounter  Medication Reason   metFORMIN (GLUCOPHAGE) 500 MG tablet Reorder   ergocalciferol (VITAMIN D2) 1.25 MG (50000 UT) capsule Reorder     Meds ordered this encounter  Medications   ergocalciferol (VITAMIN D2) 1.25 MG (50000 UT) capsule    Sig: 1 q Thursday and 1 q Sun    Dispense:  8 capsule    Refill:  0   metFORMIN (GLUCOPHAGE) 500 MG tablet    Sig: Take 1 tablet (500 mg total) by mouth 2 (two) times daily with a meal.    Dispense:  60 tablet    Refill:  0    30 d supply;  ** OV for RF **   Do not send RF request     mellitus with other specified complication, with long-term current use of insulin (HCC) Continue Ozempic 2 mg (no CI). Refill metformin 500 mg twice daily.  - Refill metFORMIN (GLUCOPHAGE) 500 MG tablet; Take 1 tablet (500 mg total) by mouth 2 (two) times daily with a meal.  Dispense: 60 tablet; Refill: 0  2. Hypertension associated with type 2 diabetes mellitus ([place]) [name] is working on healthy weight loss and exercise to improve blood pressure control. We will watch for signs of hypotension as she continues her lifestyle modifications.  Increase  water intake.  3. Hyperlipidemia associated with type 2 diabetes mellitus ([place]) Cardiovascular risk and specific lipid/LDL goals reviewed.  We discussed several lifestyle modifications today and [name] will continue to work on diet, exercise and weight loss efforts. Orders and follow up as documented in patient record. Continue Lipitor.  Counseling Intensive lifestyle modifications are the first line treatment for this issue. Dietary changes: Increase soluble fiber. Decrease simple carbohydrates. Exercise changes: Moderate to vigorous-intensity aerobic activity 150 minutes per week if  tolerated. Lipid-lowering medications: see documented in medical record.  4. Vitamin D deficiency Vitamin D level is essentially the same.  Will increase ergocalciferol to twice weekly.  - Increase ergocalciferol (VITAMIN D2) 1.25 MG (50000 UT) capsule; 1 q Thursday and 1 q Sun  Dispense: 8 capsule; Refill: 0  5. At risk for heart disease [name] was given approximately 15 minutes of coronary artery disease prevention counseling today. She is 61 y.o. female and has risk factors for heart disease including obesity. We discussed intensive lifestyle modifications today with an emphasis on specific weight loss instructions and strategies.  Repetitive spaced learning was employed today to elicit superior memory formation and behavioral change.   6. Obesity, current BMI 51.1  [name] is currently in the action stage of change. As such, her goal is to continue with weight loss efforts. She has agreed to the Category 2 Plan with breakfast and lunch options.   Exercise goals: For substantial health benefits, adults should do at least 150 minutes (2 hours and 30 minutes) a week of moderate-intensity, or 75 minutes (1 hour and 15 minutes) a week of vigorous-intensity aerobic physical activity, or an equivalent combination of moderate- and vigorous-intensity aerobic activity. Aerobic activity should be performed in episodes of at least 10 minutes, and preferably, it should be spread throughout the week.  Behavioral modification strategies: increasing lean protein intake, decreasing simple carbohydrates, avoiding temptations, and planning for success.  [name] has agreed to follow-up with our clinic in 3 weeks. She was informed of the importance of frequent follow-up visits to maximize her success with intensive lifestyle modifications for her multiple health conditions.   Objective:   Blood pressure 112/70, pulse 68, temperature 98 F (36.7 C), height '5\' 1"'$  (1.549 m), weight 270 lb (122.5 kg), SpO2 97  %. Body mass index is 51.02 kg/m.  General: Cooperative, alert, well developed, in no acute distress. HEENT: Conjunctivae and lids unremarkable. Cardiovascular: Regular rhythm.  Lungs: Normal work of breathing. Neurologic: No focal deficits.   Lab Results  Component Value Date   CREATININE 1.08 (H) 04/29/2021   BUN 27 04/29/2021   NA 144 04/29/2021   K 4.4 04/29/2021   CL 101 04/29/2021   CO2 24 04/29/2021   Lab Results  Component Value Date   ALT 14 04/29/2021   AST 13 04/29/2021   ALKPHOS 101 04/29/2021   BILITOT 0.5 04/29/2021   Lab Results  Component Value Date   HGBA1C 7.5 (H) 04/29/2021   HGBA1C 7.8 (H) 02/23/2021   HGBA1C 8.7 (H) 05/13/2020   HGBA1C 7.5 (H) 02/04/2020   HGBA1C 8.2 (H) 09/18/2019   Lab Results  Component Value Date   INSULIN 10.1 04/29/2021   Lab Results  Component Value Date   TSH 1.320 04/29/2021   Lab Results  Component Value Date   CHOL 127 04/29/2021   HDL 45 04/29/2021   LDLCALC 62 04/29/2021   LDLDIRECT 66 04/09/2009   TRIG 107 04/29/2021   CHOLHDL 3.5 05/13/2020   Lab Results  Component Value Date   VD25OH 38.8 04/29/2021   VD25OH 38.0 02/23/2021   VD25OH 36.2 10/27/2020   Lab Results  Component Value Date   WBC 6.4 10/27/2020   HGB 13.9 10/27/2020   HCT 44 10/27/2020   MCV 85 09/18/2019   PLT 283 10/27/2020   Attestation Statements:   Reviewed by clinician on day of visit: allergies, medications, problem list, medical history, surgical history, family history, social history, and previous encounter notes.  I, Water quality scientist, CMA, am acting as Location manager for [employer], DO.  I have reviewed the above documentation for accuracy and completeness, and I agree with the above. Marjory Sneddon, D.O.  The [place] was signed into law in 2016 which includes the topic of electronic health records.  This provides immediate access to information in MyChart.  This includes consultation notes,  operative notes, office notes, lab results and pathology reports.  If you have any questions about what you read please let us know at your next visit so we can discuss your concerns and take corrective action if need be.  We are right here with you.

## 2021-06-15 ENCOUNTER — Other Ambulatory Visit: Payer: Self-pay

## 2021-06-15 ENCOUNTER — Encounter (INDEPENDENT_AMBULATORY_CARE_PROVIDER_SITE_OTHER): Payer: Self-pay | Admitting: Family Medicine

## 2021-06-15 ENCOUNTER — Ambulatory Visit (INDEPENDENT_AMBULATORY_CARE_PROVIDER_SITE_OTHER): Payer: Medicare HMO | Admitting: Family Medicine

## 2021-06-15 VITALS — BP 101/67 | HR 75 | Temp 98.0°F | Ht 61.0 in | Wt 272.0 lb

## 2021-06-15 DIAGNOSIS — E669 Obesity, unspecified: Secondary | ICD-10-CM | POA: Diagnosis not present

## 2021-06-15 DIAGNOSIS — Z794 Long term (current) use of insulin: Secondary | ICD-10-CM

## 2021-06-15 DIAGNOSIS — E1169 Type 2 diabetes mellitus with other specified complication: Secondary | ICD-10-CM | POA: Diagnosis not present

## 2021-06-15 DIAGNOSIS — Z6841 Body Mass Index (BMI) 40.0 and over, adult: Secondary | ICD-10-CM | POA: Diagnosis not present

## 2021-06-15 DIAGNOSIS — E559 Vitamin D deficiency, unspecified: Secondary | ICD-10-CM | POA: Diagnosis not present

## 2021-06-15 MED ORDER — ERGOCALCIFEROL 1.25 MG (50000 UT) PO CAPS: ORAL_CAPSULE | ORAL | 0 refills | Status: DC

## 2021-06-15 MED ORDER — METFORMIN HCL 500 MG PO TABS: 500.0000 mg | ORAL_TABLET | Freq: Two times a day (BID) | ORAL | 0 refills | Status: DC

## 2021-06-19 ENCOUNTER — Other Ambulatory Visit (INDEPENDENT_AMBULATORY_CARE_PROVIDER_SITE_OTHER): Payer: Self-pay | Admitting: Family Medicine

## 2021-06-19 DIAGNOSIS — E559 Vitamin D deficiency, unspecified: Secondary | ICD-10-CM

## 2021-06-24 NOTE — Progress Notes (Signed)
? ? ? ?Chief Complaint:  ? ?OBESITY ?[name] is here to discuss her progress with her obesity treatment plan along with follow-up of her obesity related diagnoses. [name] is on the Category 2 Plan with breakfast and lunch options and states she is following her eating plan approximately 98% of the time. [name] states she is walking for 60 minutes 4-5 times per week. ? ?Today's visit was #: 30 ?Starting weight: 266 lbs ?Starting date: 09/18/2019 ?Today's weight: 272 lbs ?Today's date: 06/15/2021 ?Total lbs lost to date: 0 ?Total lbs lost since last in-office visit: 0 ? ?Interim History: [name] is here for a follow up office visit.  We reviewed her meal plan and all questions were answered.  Patient's food recall appears to be accurate and consistent with what is on plan when she is following it.   When eating on plan, her hunger and cravings are well controlled.   ? ?[name] endorses following the meal plan 100% of the time.  We are not sure how she is gaining with following the plan so closely.  She states her daughter or son will bring her food on Sundays, but it is on her plan. ? ?Subjective:  ? ?1. Type 2 diabetes mellitus with other specified complication, with long-term current use of insulin ([name]) ?FBS 100 this moring, highest 130, lowest 100.  She started Ozempic 2 mg today.  Tolerting well. Helping with appentite and cravings.  She checks BS TID.  She takes Invokana, Ozempic, glucotrol, and Lantus. ? ?2. Vitamin D deficiency ?[name] is tolerating medication(s) well without side effects.  Medication compliance is good and patient appears to be taking it as prescribed.  Denies additional concerns regarding this condition.  ? ?Assessment/Plan:  ?No orders of the defined types were placed in this encounter. ? ? ?Medications Discontinued During This Encounter  ?Medication Reason  ? ergocalciferol (VITAMIN D2) 1.25 MG (50000 UT) capsule Reorder  ? metFORMIN (GLUCOPHAGE) 500 MG tablet Reorder  ?   ? ?Meds ordered this encounter  ?Medications  ? ergocalciferol (VITAMIN D2) 1.25 MG (50000 UT) capsule  ?  Sig: 1 q Thursday and 1 q Sun  ?  Dispense:  8 capsule  ?  Refill:  0  ? metFORMIN (GLUCOPHAGE) 500 MG tablet  ?  Sig: Take 1 tablet (500 mg total) by mouth 2 (two) times daily with a meal.  ?  Dispense:  60 tablet  ?  Refill:  0  ?  30 d supply;  ** OV for RF **   Do not send RF request  ?  ? ?1. Type 2 diabetes mellitus with other specified complication, with long-term current use of insulin ([name]) ?Cautioned her again with use of glucotrol with GLP and other medications.  No lows and she hs stayed extra vigilant.  Continue prudent nutritional plan and weight loss. ? ?- Refill metFORMIN (GLUCOPHAGE) 500 MG tablet; Take 1 tablet (500 mg total) by mouth 2 (two) times daily with a meal.  Dispense: 60 tablet; Refill: 0 ? ?2. Vitamin D deficiency ?Refill ergocalciferol 50,000 IU twice weekly. ? ?- Refill ergocalciferol (VITAMIN D2) 1.25 MG (50000 UT) capsule; 1 q Thursday and 1 q Sun  Dispense: 8 capsule; Refill: 0 ? ?3. Obesity with current BMI of 51.5 ? ?[name] is currently in the action stage of change. As such, her goal is to continue with weight loss efforts. She has agreed to the Category 2 Plan.  ? ?Exercise goals:  As is. ? ?  Behavioral modification strategies: increasing lean protein intake, decreasing simple carbohydrates, and planning for success. ? ?[name] has agreed to follow-up with our clinic in 2-4 weeks. She was informed of the importance of frequent follow-up visits to maximize her success with intensive lifestyle modifications for her multiple health conditions.  ? ?Objective:  ? ?Blood pressure 101/67, pulse 75, temperature 98 ?F (36.7 ?C), height '5\' 1"'$  (1.549 m), weight 272 lb (123.4 kg), SpO2 97 %. ?Body mass index is 51.39 kg/m?. ? ?General: Cooperative, alert, well developed, in no acute distress. ?HEENT: Conjunctivae and lids unremarkable. ?Cardiovascular: Regular rhythm.  ?Lungs: Normal  work of breathing. ?Neurologic: No focal deficits.  ? ?Lab Results  ?Component Value Date  ? CREATININE 1.08 (H) 04/29/2021  ? BUN 27 04/29/2021  ? NA 144 04/29/2021  ? K 4.4 04/29/2021  ? CL 101 04/29/2021  ? CO2 24 04/29/2021  ? ?Lab Results  ?Component Value Date  ? ALT 14 04/29/2021  ? AST 13 04/29/2021  ? ALKPHOS 101 04/29/2021  ? BILITOT 0.5 04/29/2021  ? ?Lab Results  ?Component Value Date  ? HGBA1C 7.5 (H) 04/29/2021  ? HGBA1C 7.8 (H) 02/23/2021  ? HGBA1C 8.7 (H) 05/13/2020  ? HGBA1C 7.5 (H) 02/04/2020  ? HGBA1C 8.2 (H) 09/18/2019  ? ?Lab Results  ?Component Value Date  ? INSULIN 10.1 04/29/2021  ? ?Lab Results  ?Component Value Date  ? TSH 1.320 04/29/2021  ? ?Lab Results  ?Component Value Date  ? CHOL 127 04/29/2021  ? HDL 45 04/29/2021  ? [place] 62 04/29/2021  ? LDLDIRECT 66 04/09/2009  ? TRIG 107 04/29/2021  ? CHOLHDL 3.5 05/13/2020  ? ?Lab Results  ?Component Value Date  ? VD25OH 38.8 04/29/2021  ? VD25OH 38.0 02/23/2021  ? VD25OH 36.2 10/27/2020  ? ?Lab Results  ?Component Value Date  ? WBC 6.4 10/27/2020  ? HGB 13.9 10/27/2020  ? HCT 44 10/27/2020  ? MCV 85 09/18/2019  ? PLT 283 10/27/2020  ? ?Obesity Behavioral Intervention:  ? ?Approximately 15 minutes were spent on the discussion below. ? ?ASK: ?We discussed the diagnosis of obesity with [name] today and [name] agreed to give [place] permission to discuss obesity behavioral modification therapy today. ? ?ASSESS: ?[name] has the diagnosis of obesity and her BMI today is 51.5. [name] is in the action stage of change.  ? ?ADVISE: ?[name] was educated on the multiple health risks of obesity as well as the benefit of weight loss to improve her health. She was advised of the need for long term treatment and the importance of lifestyle modifications to improve her current health and to decrease her risk of future health problems. ? ?AGREE: ?Multiple dietary modification options and treatment options were discussed and [name] agreed to follow the  recommendations documented in the above note. ? ?ARRANGE: ?[name] was educated on the importance of frequent visits to treat obesity as outlined per CMS and USPSTF guidelines and agreed to schedule her next follow up appointment today. ? ?Attestation Statements:  ? ?Reviewed by clinician on day of visit: allergies, medications, problem list, medical history, surgical history, family history, social history, and previous encounter notes. ? ?I, Water quality scientist, CMA, am acting as transcriptionist for [employer], DO. ? ?I have reviewed the above documentation for accuracy and completeness, and I agree with the above. Marjory Sneddon, D.O. ? ?The [place] was signed into law in 2016 which includes the topic of electronic health records.  This provides immediate access to  information in [place].  This includes consultation notes, operative notes, office notes, lab results and pathology reports.  If you have any questions about what you read please let us know at your next visit so we can discuss your concerns and take corrective action if need be.  We are right here with you. ? ?

## 2021-07-13 ENCOUNTER — Other Ambulatory Visit (INDEPENDENT_AMBULATORY_CARE_PROVIDER_SITE_OTHER): Payer: Self-pay | Admitting: Family Medicine

## 2021-07-13 ENCOUNTER — Encounter (INDEPENDENT_AMBULATORY_CARE_PROVIDER_SITE_OTHER): Payer: Self-pay | Admitting: Family Medicine

## 2021-07-13 ENCOUNTER — Ambulatory Visit (INDEPENDENT_AMBULATORY_CARE_PROVIDER_SITE_OTHER): Payer: Medicare HMO | Admitting: Family Medicine

## 2021-07-13 VITALS — BP 118/74 | HR 61 | Temp 98.2°F | Ht 61.0 in | Wt 279.0 lb

## 2021-07-13 DIAGNOSIS — Z7985 Long-term (current) use of injectable non-insulin antidiabetic drugs: Secondary | ICD-10-CM

## 2021-07-13 DIAGNOSIS — I152 Hypertension secondary to endocrine disorders: Secondary | ICD-10-CM | POA: Diagnosis not present

## 2021-07-13 DIAGNOSIS — E1159 Type 2 diabetes mellitus with other circulatory complications: Secondary | ICD-10-CM

## 2021-07-13 DIAGNOSIS — E559 Vitamin D deficiency, unspecified: Secondary | ICD-10-CM | POA: Diagnosis not present

## 2021-07-13 DIAGNOSIS — E1169 Type 2 diabetes mellitus with other specified complication: Secondary | ICD-10-CM

## 2021-07-13 DIAGNOSIS — Z6841 Body Mass Index (BMI) 40.0 and over, adult: Secondary | ICD-10-CM

## 2021-07-13 DIAGNOSIS — E669 Obesity, unspecified: Secondary | ICD-10-CM

## 2021-07-14 ENCOUNTER — Other Ambulatory Visit (INDEPENDENT_AMBULATORY_CARE_PROVIDER_SITE_OTHER): Payer: Self-pay | Admitting: Family Medicine

## 2021-07-14 DIAGNOSIS — E559 Vitamin D deficiency, unspecified: Secondary | ICD-10-CM

## 2021-07-23 NOTE — Progress Notes (Signed)
? ? ? ?Chief Complaint:  ? ?OBESITY ?[name] is here to discuss her progress with her obesity treatment plan along with follow-up of her obesity related diagnoses. [name] is on the Category 2 Plan and states she is following her eating plan approximately 60% of the time. [name] states she is walking and using bands 30 minutes 3 times per week. ? ?Today's visit was #: 31 ?Starting weight: 266 lbs ?Starting date: 09/18/2019 ?Today's weight: 279 lbs ?Today's date: 07/13/2021 ?Total lbs lost to date: 0 ?Total lbs lost since last in-office visit: 0 ? ?Interim History: [name] started the program at 266 pounds. Now she is at 279 pounds. [name] states that she had a death in the family recently and that it is very easy to get off track and skip meals. ? ?Subjective:  ? ?1. Type 2 diabetes mellitus with other specified complication, with long-term current use of insulin ([place]) ?We have [name] on Ozempic 2 mg weekly. Her PCP has her on Invokana, Glucotrol, and Lantus. Blood sugars have been stable with no lows. The highest blood sugar level was 140 and the lowest was 90. ? ?2. Hypertension associated with type 2 diabetes mellitus ([place]) ?[name] is on Lopressor and Lasix per her PCP. She is asymptomatic and denies concerns. ? ?3. Vitamin D deficiency ?[name] is tolerating medication(s) well without side effects. Her last vitamin D level was 38.8 about 2 months ago. Medication compliance is good as patient endorses taking it as prescribed. The patient denies additional concerns regarding this condition.     ?  ?Assessment/Plan:  ?No orders of the defined types were placed in this encounter. ? ? ?There are no discontinued medications.  ? ?No orders of the defined types were placed in this encounter. ?  ? ?1. Type 2 diabetes mellitus with other specified complication, with long-term current use of insulin ([place]) ?[name] agrees to continue taking Ozempic and metformin and she does not need a refill. The other  medications will be prescribed by her PCP. ? ?2. Hypertension associated with type 2 diabetes mellitus ([place]) ?Jammy's blood pressure is stable. Her medication management will be done by her PCP. ? ?3. Vitamin D deficiency ?Low Vitamin D level contributes to fatigue and are associated with obesity, breast, and colon cancer. She agrees to continue to take prescription Vitamin D '@50'$ ,000 IU every week and she does not need a refill. She agrees to follow-up for routine testing of Vitamin D, at least 2-3 times per year to avoid over-replacement. We will recheck her vitamin D level in late May to early June. ? ?4. Obesity, current BMI 52.8 ?[name] agrees to follow up with her PCP in the near future the 1st week of May. She agrees to bring all the labs drawn to her next office visit. ? ?[name] is currently in the action stage of change. As such, her goal is to continue with weight loss efforts. She has agreed to the Category 2 Plan.  ? ?Exercise goals:  As is. ? ?Behavioral modification strategies: meal planning and cooking strategies and planning for success. ? ?[name] has agreed to follow-up with our clinic in 3 weeks. She was informed of the importance of frequent follow-up visits to maximize her success with intensive lifestyle modifications for her multiple health conditions.  ? ?Objective:  ? ?Blood pressure 118/74, pulse 61, temperature 98.2 ?F (36.8 ?C), height '5\' 1"'$  (1.549 m), weight 279 lb (126.6 kg), SpO2 97 %. ?Body mass index is 52.72 kg/m?. ? ?General: Cooperative,  alert, well developed, in no acute distress. ?HEENT: Conjunctivae and lids unremarkable. ?Cardiovascular: Regular rhythm.  ?Lungs: Normal work of breathing. ?Neurologic: No focal deficits.  ? ?Lab Results  ?Component Value Date  ? CREATININE 1.08 (H) 04/29/2021  ? BUN 27 04/29/2021  ? NA 144 04/29/2021  ? K 4.4 04/29/2021  ? CL 101 04/29/2021  ? CO2 24 04/29/2021  ? ?Lab Results  ?Component Value Date  ? ALT 14 04/29/2021  ? AST 13 04/29/2021   ? ALKPHOS 101 04/29/2021  ? BILITOT 0.5 04/29/2021  ? ?Lab Results  ?Component Value Date  ? HGBA1C 7.5 (H) 04/29/2021  ? HGBA1C 7.8 (H) 02/23/2021  ? HGBA1C 8.7 (H) 05/13/2020  ? HGBA1C 7.5 (H) 02/04/2020  ? HGBA1C 8.2 (H) 09/18/2019  ? ?Lab Results  ?Component Value Date  ? INSULIN 10.1 04/29/2021  ? ?Lab Results  ?Component Value Date  ? TSH 1.320 04/29/2021  ? ?Lab Results  ?Component Value Date  ? CHOL 127 04/29/2021  ? HDL 45 04/29/2021  ? [place] 62 04/29/2021  ? LDLDIRECT 66 04/09/2009  ? TRIG 107 04/29/2021  ? CHOLHDL 3.5 05/13/2020  ? ?Lab Results  ?Component Value Date  ? VD25OH 38.8 04/29/2021  ? VD25OH 38.0 02/23/2021  ? VD25OH 36.2 10/27/2020  ? ?Lab Results  ?Component Value Date  ? WBC 6.4 10/27/2020  ? HGB 13.9 10/27/2020  ? HCT 44 10/27/2020  ? MCV 85 09/18/2019  ? PLT 283 10/27/2020  ? ?No results found for: IRON, TIBC, FERRITIN ? ?Obesity Behavioral Intervention:  ? ?Approximately 15 minutes were spent on the discussion below. ? ?ASK: ?We discussed the diagnosis of obesity with [name] today and [name] agreed to give [place] permission to discuss obesity behavioral modification therapy today. ? ?ASSESS: ?[name] has the diagnosis of obesity and her BMI today is 52.8. [name] is in the action stage of change.  ? ?ADVISE: ?[name] was educated on the multiple health risks of obesity as well as the benefit of weight loss to improve her health. She was advised of the need for long term treatment and the importance of lifestyle modifications to improve her current health and to decrease her risk of future health problems. ? ?AGREE: ?Multiple dietary modification options and treatment options were discussed and [name] agreed to follow the recommendations documented in the above note. ? ?ARRANGE: ?[name] was educated on the importance of frequent visits to treat obesity as outlined per CMS and USPSTF guidelines and agreed to schedule her next follow up appointment today. ? ?Attestation Statements:   ? ?Reviewed by clinician on day of visit: allergies, medications, problem list, medical history, surgical history, family history, social history, and previous encounter notes. ? ?I, Marcille Blanco, CMA, am acting as transcriptionist for [employer], DO ? ?I have reviewed the above documentation for accuracy and completeness, and I agree with the above. Marjory Sneddon, D.O. ? ?The [place] was signed into law in 2016 which includes the topic of electronic health records.  This provides immediate access to information in MyChart.  This includes consultation notes, operative notes, office notes, lab results and pathology reports.  If you have any questions about what you read please let us know at your next visit so we can discuss your concerns and take corrective action if need be.  We are right here with you. ? ?

## 2021-08-05 ENCOUNTER — Ambulatory Visit (INDEPENDENT_AMBULATORY_CARE_PROVIDER_SITE_OTHER): Payer: Medicare HMO | Admitting: Adult Health

## 2021-08-05 ENCOUNTER — Encounter (INDEPENDENT_AMBULATORY_CARE_PROVIDER_SITE_OTHER): Payer: Self-pay | Admitting: Adult Health

## 2021-08-05 VITALS — BP 129/79 | HR 65 | Temp 98.2°F | Ht 61.0 in | Wt 274.0 lb

## 2021-08-05 DIAGNOSIS — E1169 Type 2 diabetes mellitus with other specified complication: Secondary | ICD-10-CM

## 2021-08-05 DIAGNOSIS — Z6841 Body Mass Index (BMI) 40.0 and over, adult: Secondary | ICD-10-CM

## 2021-08-05 DIAGNOSIS — M170 Bilateral primary osteoarthritis of knee: Secondary | ICD-10-CM | POA: Diagnosis not present

## 2021-08-05 DIAGNOSIS — E669 Obesity, unspecified: Secondary | ICD-10-CM | POA: Diagnosis not present

## 2021-08-05 DIAGNOSIS — Z7985 Long-term (current) use of injectable non-insulin antidiabetic drugs: Secondary | ICD-10-CM

## 2021-08-05 DIAGNOSIS — M1712 Unilateral primary osteoarthritis, left knee: Secondary | ICD-10-CM | POA: Diagnosis not present

## 2021-08-05 DIAGNOSIS — M1711 Unilateral primary osteoarthritis, right knee: Secondary | ICD-10-CM

## 2021-08-05 DIAGNOSIS — G8929 Other chronic pain: Secondary | ICD-10-CM | POA: Diagnosis not present

## 2021-08-05 DIAGNOSIS — Z794 Long term (current) use of insulin: Secondary | ICD-10-CM

## 2021-08-05 MED ORDER — SEMAGLUTIDE (2 MG/DOSE) 8 MG/3ML ~~LOC~~ SOPN: 2.0000 mg | PEN_INJECTOR | SUBCUTANEOUS | 0 refills | Status: DC

## 2021-08-11 NOTE — Progress Notes (Unsigned)
Chief Complaint:   OBESITY [name] is here to discuss her progress with her obesity treatment plan along with follow-up of her obesity related diagnoses. [name] is on the Category 2 Plan and states she is following her eating plan approximately 80% of the time. [name] states she is walking for 45 minutes 3 times per week.  Today's visit was #: 35 Starting weight: 266 lbs Starting date: 09/18/2019 Today's weight: 274 lbs Today's date: 08/05/21 Total lbs lost to date: 0 Total lbs lost since last in-office visit: -5  Interim History: She has been participating in home exercise-limited by bilateral knee pain. She endorses increased energy level. She has titrated up to Ozempic 2 mg once weekly.  Subjective:   1. Type 2 diabetes mellitus with other specified complication, with long-term current use of insulin ([facility]) Ambulatory fasting blood glucose 110-120, Postprandial 130-137. She denies symptoms of hypoglycemia.  2. Chronic pain of both knees She reports bilateral knee pain "for years". ***  Assessment/Plan:   1. Type 2 diabetes mellitus with other specified complication, with long-term current use of insulin ([facility]) Refill: - Semaglutide, 2 MG/DOSE, 8 MG/3ML SOPN; Inject 2 mg as directed once a week.  Dispense: 9 mL; Refill: 0  2. Chronic pain of both knees Keep follow-up with Dr. Alda Berthold specialist-10/26/2021.  3. Obesity, current BMI 51.8 [name] is currently in the action stage of change. As such, her goal is to continue with weight loss efforts. She has agreed to the Category 2 Plan.   Breathing lab work from 08/03/2021 PCP visit to next office visit.  Exercise goals: as is.  Behavioral modification strategies: increasing lean protein intake, decreasing simple carbohydrates, no skipping meals, meal planning and cooking strategies, and planning for success.  [name] has agreed to follow-up with our clinic in 3-4 weeks. She was informed of the importance of  frequent follow-up visits to maximize her success with intensive lifestyle modifications for her multiple health conditions.   Objective:   Blood pressure 129/79, pulse 65, temperature 98.2 F (36.8 C), height '5\' 1"'$  (1.549 m), weight 274 lb (124.3 kg), SpO2 97 %. Body mass index is 51.77 kg/m.  General: Cooperative, alert, well developed, in no acute distress. HEENT: Conjunctivae and lids unremarkable. Cardiovascular: Regular rhythm.  Lungs: Normal work of breathing. Neurologic: No focal deficits.   Lab Results  Component Value Date   CREATININE 1.08 (H) 04/29/2021   BUN 27 04/29/2021   NA 144 04/29/2021   K 4.4 04/29/2021   CL 101 04/29/2021   CO2 24 04/29/2021   Lab Results  Component Value Date   ALT 14 04/29/2021   AST 13 04/29/2021   ALKPHOS 101 04/29/2021   BILITOT 0.5 04/29/2021   Lab Results  Component Value Date   HGBA1C 7.5 (H) 04/29/2021   HGBA1C 7.8 (H) 02/23/2021   HGBA1C 8.7 (H) 05/13/2020   HGBA1C 7.5 (H) 02/04/2020   HGBA1C 8.2 (H) 09/18/2019   Lab Results  Component Value Date   INSULIN 10.1 04/29/2021   Lab Results  Component Value Date   TSH 1.320 04/29/2021   Lab Results  Component Value Date   CHOL 127 04/29/2021   HDL 45 04/29/2021   LDLCALC 62 04/29/2021   LDLDIRECT 66 04/09/2009   TRIG 107 04/29/2021   CHOLHDL 3.5 05/13/2020   Lab Results  Component Value Date   VD25OH 38.8 04/29/2021   VD25OH 38.0 02/23/2021   VD25OH 36.2 10/27/2020   Lab Results  Component Value Date  WBC 6.4 10/27/2020   HGB 13.9 10/27/2020   HCT 44 10/27/2020   MCV 85 09/18/2019   PLT 283 10/27/2020   No results found for: IRON, TIBC, FERRITIN  Obesity Behavioral Intervention:   Approximately 15 minutes were spent on the discussion below.  ASK: We discussed the diagnosis of obesity with Alaiza today and Reniya agreed to give [place] permission to discuss obesity behavioral modification therapy today.  ASSESS: Keilyn has the diagnosis of obesity  and her BMI today is 51.8. Alvera is in the action stage of change.   ADVISE: Victorine was educated on the multiple health risks of obesity as well as the benefit of weight loss to improve her health. She was advised of the need for long term treatment and the importance of lifestyle modifications to improve her current health and to decrease her risk of future health problems.  AGREE: Multiple dietary modification options and treatment options were discussed and Terese agreed to follow the recommendations documented in the above note.  ARRANGE: Alyssia was educated on the importance of frequent visits to treat obesity as outlined per CMS and USPSTF guidelines and agreed to schedule her next follow up appointment today.  Attestation Statements:   Reviewed by clinician on day of visit: allergies, medications, problem list, medical history, surgical history, family history, social history, and previous encounter notes.  I, Georgianne Fick, FNP, am acting as Location manager for Mina Marble, NP.  I have reviewed the above documentation for accuracy and completeness, and I agree with the above. -  ***

## 2021-08-26 ENCOUNTER — Ambulatory Visit (INDEPENDENT_AMBULATORY_CARE_PROVIDER_SITE_OTHER): Payer: Medicare HMO | Admitting: Family Medicine

## 2021-08-26 ENCOUNTER — Encounter (INDEPENDENT_AMBULATORY_CARE_PROVIDER_SITE_OTHER): Payer: Self-pay | Admitting: Family Medicine

## 2021-08-26 VITALS — BP 134/80 | HR 61 | Temp 98.1°F | Ht 61.0 in | Wt 277.0 lb

## 2021-08-26 DIAGNOSIS — Z7985 Long-term (current) use of injectable non-insulin antidiabetic drugs: Secondary | ICD-10-CM | POA: Diagnosis not present

## 2021-08-26 DIAGNOSIS — E1169 Type 2 diabetes mellitus with other specified complication: Secondary | ICD-10-CM | POA: Diagnosis not present

## 2021-08-26 DIAGNOSIS — Z7984 Long term (current) use of oral hypoglycemic drugs: Secondary | ICD-10-CM | POA: Diagnosis not present

## 2021-08-26 DIAGNOSIS — Z794 Long term (current) use of insulin: Secondary | ICD-10-CM | POA: Diagnosis not present

## 2021-08-26 DIAGNOSIS — Z6841 Body Mass Index (BMI) 40.0 and over, adult: Secondary | ICD-10-CM | POA: Diagnosis not present

## 2021-08-26 DIAGNOSIS — E559 Vitamin D deficiency, unspecified: Secondary | ICD-10-CM

## 2021-08-26 DIAGNOSIS — E669 Obesity, unspecified: Secondary | ICD-10-CM | POA: Insufficient documentation

## 2021-08-26 MED ORDER — ERGOCALCIFEROL 1.25 MG (50000 UT) PO CAPS: ORAL_CAPSULE | ORAL | 0 refills | Status: DC

## 2021-08-27 LAB — VITAMIN D 25 HYDROXY (VIT D DEFICIENCY, FRACTURES): Vit D, 25-Hydroxy: 64.9 ng/mL (ref 30.0–100.0)

## 2021-08-27 LAB — HEMOGLOBIN A1C
Est. average glucose Bld gHb Est-mCnc: 143 mg/dL
Hgb A1c MFr Bld: 6.6 % — ABNORMAL HIGH (ref 4.8–5.6)

## 2021-08-31 NOTE — Progress Notes (Signed)
Chief Complaint:   OBESITY Christmas is here to discuss her progress with her obesity treatment plan along with follow-up of her obesity related diagnoses. [name] is on the Category 2 Plan and states she is following her eating plan approximately 87% of the time. [name] states she is walking 15 minutes 3 times per week.  Today's visit was #: 67 Starting weight: 266 lbs Starting date: 09/18/2019 Today's weight: 277 lbs Today's date: 08/26/2021 Total lbs lost to date: 0 Total lbs lost since last in-office visit: +3  Interim History: [name] states she is following the meal plan closely but skips breakfast most of the time. Her muscle mass is down 9.1 lbs and fat mass is up 12 lbs from her last OV. She snacks on peanut butter crackers, fruits, and yogurt.  Subjective:   1. Type 2 diabetes mellitus with obesity ([name]) On 04/29/2021, pt's A1c was 7.5. She endorses blood sugars are at goal at 100-130. Medication: Ozempic 2 mg, Invokana, Metformin, glipizide, Lantus 50 units in AM and 30 units in afternoon  2. Vitamin D deficiency She endorses taking prescription vitamin D 50,000 IU twice a week. She denies nausea, vomiting or muscle weakness.  Assessment/Plan:   Orders Placed This Encounter  Procedures   Hemoglobin A1c   VITAMIN D 25 Hydroxy (Vit-D Deficiency, Fractures)    Medications Discontinued During This Encounter  Medication Reason   ergocalciferol (VITAMIN D2) 1.25 MG (50000 UT) capsule Reorder     Meds ordered this encounter  Medications   ergocalciferol (VITAMIN D2) 1.25 MG (50000 UT) capsule    Sig: 1 q Thursday and 1 q Sun    Dispense:  8 capsule    Refill:  0     1. Type 2 diabetes mellitus with obesity (HCC) Good blood sugar control is important to decrease the likelihood of diabetic complications such as nephropathy, neuropathy, limb loss, blindness, coronary artery disease, and death. Intensive lifestyle modification including diet, exercise and weight loss  are the first line of treatment for diabetes. Continue current treatment plan. Continue prudent nutritional plan. Weight loss is needed. Obtain fasting labs today.  - Hemoglobin A1c  2. Vitamin D deficiency Low Vitamin D level contributes to fatigue and are associated with obesity, breast, and colon cancer. She agrees to continue to take prescription Vitamin D '@50'$ ,000 IU twice a week and will follow-up for routine testing of Vitamin D, at least 2-3 times per year to avoid over-replacement. Obtain fasting labs today.  Refill- ergocalciferol (VITAMIN D2) 1.25 MG (50000 UT) capsule; 1 q Thursday and 1 q Sun  Dispense: 8 capsule; Refill: 0  - VITAMIN D 25 Hydroxy (Vit-D Deficiency, Fractures)  3. Obesity, Current BMI 52.3 [name] is currently in the action stage of change. As such, her goal is to continue with weight loss efforts. She has agreed to the Category 2 Plan.   Exercise goals:  As is  Behavioral modification strategies: increasing lean protein intake, decreasing simple carbohydrates, and no skipping meals.  [name] has agreed to follow-up with our clinic in 4 weeks. She was informed of the importance of frequent follow-up visits to maximize her success with intensive lifestyle modifications for her multiple health conditions.   [name] was informed we would discuss her lab results at her next visit unless there is a critical issue that needs to be addressed sooner. [name] agreed to keep her next visit at the agreed upon time to discuss these results.  Objective:   Blood pressure 134/80,  pulse 61, temperature 98.1 F (36.7 C), height '5\' 1"'$  (1.549 m), weight 277 lb (125.6 kg), SpO2 96 %. Body mass index is 52.34 kg/m.  General: Cooperative, alert, well developed, in no acute distress. HEENT: Conjunctivae and lids unremarkable. Cardiovascular: Regular rhythm.  Lungs: Normal work of breathing. Neurologic: No focal deficits.   Lab Results  Component Value Date   CREATININE 1.08  (H) 04/29/2021   BUN 27 04/29/2021   NA 144 04/29/2021   K 4.4 04/29/2021   CL 101 04/29/2021   CO2 24 04/29/2021   Lab Results  Component Value Date   ALT 14 04/29/2021   AST 13 04/29/2021   ALKPHOS 101 04/29/2021   BILITOT 0.5 04/29/2021   Lab Results  Component Value Date   HGBA1C 6.6 (H) 08/26/2021   HGBA1C 7.5 (H) 04/29/2021   HGBA1C 7.8 (H) 02/23/2021   HGBA1C 8.7 (H) 05/13/2020   HGBA1C 7.5 (H) 02/04/2020   Lab Results  Component Value Date   INSULIN 10.1 04/29/2021   Lab Results  Component Value Date   TSH 1.320 04/29/2021   Lab Results  Component Value Date   CHOL 127 04/29/2021   HDL 45 04/29/2021   LDLCALC 62 04/29/2021   LDLDIRECT 66 04/09/2009   TRIG 107 04/29/2021   CHOLHDL 3.5 05/13/2020   Lab Results  Component Value Date   VD25OH 64.9 08/26/2021   VD25OH 38.8 04/29/2021   VD25OH 38.0 02/23/2021   Lab Results  Component Value Date   WBC 6.4 10/27/2020   HGB 13.9 10/27/2020   HCT 44 10/27/2020   MCV 85 09/18/2019   PLT 283 10/27/2020   No results found for: "IRON", "TIBC", "FERRITIN"  Obesity Behavioral Intervention:   Approximately 15 minutes were spent on the discussion below.  ASK: We discussed the diagnosis of obesity with [name] today and [name] agreed to give [place] permission to discuss obesity behavioral modification therapy today.  ASSESS: [name] has the diagnosis of obesity and her BMI today is 52.3. [name] is in the action stage of change.   ADVISE: [name] was educated on the multiple health risks of obesity as well as the benefit of weight loss to improve her health. She was advised of the need for long term treatment and the importance of lifestyle modifications to improve her current health and to decrease her risk of future health problems.  AGREE: Multiple dietary modification options and treatment options were discussed and [name] agreed to follow the recommendations documented in the above  note.  ARRANGE: [name] was educated on the importance of frequent visits to treat obesity as outlined per CMS and USPSTF guidelines and agreed to schedule her next follow up appointment today.  Attestation Statements:   Reviewed by clinician on day of visit: allergies, medications, problem list, medical history, surgical history, family history, social history, and previous encounter notes.  I, Kathlene November, BS, CMA, am acting as transcriptionist for [employer], DO.  I have reviewed the above documentation for accuracy and completeness, and I agree with the above. Marjory Sneddon, D.O.  The [place] was signed into law in 2016 which includes the topic of electronic health records.  This provides immediate access to information in MyChart.  This includes consultation notes, operative notes, office notes, lab results and pathology reports.  If you have any questions about what you read please let us know at your next visit so we can discuss your concerns and take corrective action if need be.  We are right here with you.

## 2021-09-03 ENCOUNTER — Other Ambulatory Visit (INDEPENDENT_AMBULATORY_CARE_PROVIDER_SITE_OTHER): Payer: Self-pay | Admitting: Family Medicine

## 2021-09-03 DIAGNOSIS — E1169 Type 2 diabetes mellitus with other specified complication: Secondary | ICD-10-CM

## 2021-09-24 ENCOUNTER — Encounter (INDEPENDENT_AMBULATORY_CARE_PROVIDER_SITE_OTHER): Payer: Self-pay | Admitting: Family Medicine

## 2021-09-24 ENCOUNTER — Ambulatory Visit (INDEPENDENT_AMBULATORY_CARE_PROVIDER_SITE_OTHER): Payer: Medicare HMO | Admitting: Family Medicine

## 2021-09-24 VITALS — BP 127/74 | HR 66 | Temp 98.3°F | Ht 61.0 in | Wt 275.0 lb

## 2021-09-24 DIAGNOSIS — Z7985 Long-term (current) use of injectable non-insulin antidiabetic drugs: Secondary | ICD-10-CM | POA: Diagnosis not present

## 2021-09-24 DIAGNOSIS — Z6841 Body Mass Index (BMI) 40.0 and over, adult: Secondary | ICD-10-CM

## 2021-09-24 DIAGNOSIS — Z794 Long term (current) use of insulin: Secondary | ICD-10-CM | POA: Diagnosis not present

## 2021-09-24 DIAGNOSIS — E559 Vitamin D deficiency, unspecified: Secondary | ICD-10-CM

## 2021-09-24 DIAGNOSIS — Z7984 Long term (current) use of oral hypoglycemic drugs: Secondary | ICD-10-CM | POA: Diagnosis not present

## 2021-09-24 DIAGNOSIS — E1159 Type 2 diabetes mellitus with other circulatory complications: Secondary | ICD-10-CM | POA: Diagnosis not present

## 2021-09-24 DIAGNOSIS — E669 Obesity, unspecified: Secondary | ICD-10-CM | POA: Diagnosis not present

## 2021-09-24 MED ORDER — METFORMIN HCL 500 MG PO TABS: 500.0000 mg | ORAL_TABLET | Freq: Two times a day (BID) | ORAL | 0 refills | Status: DC

## 2021-09-24 MED ORDER — ERGOCALCIFEROL 1.25 MG (50000 UT) PO CAPS: ORAL_CAPSULE | ORAL | 0 refills | Status: DC

## 2021-09-25 NOTE — Progress Notes (Unsigned)
Chief Complaint:   OBESITY [name] is here to discuss her progress with her obesity treatment plan along with follow-up of her obesity related diagnoses. [name] is on the Category 2 Plan and states she is following her eating plan approximately 90% of the time. [name] states she is walking 30 minutes 5 times per week.  Today's visit was #: 37 Starting weight: 266 lbs Starting date: 09/18/2019 Today's weight: 275 Today's date: 09/24/2021 Total lbs lost to date: 0 Total lbs lost since last in-office visit: 2 lbs  Interim History: [name] is eating the plan about 90%.  She has been snacking on fruits only.  She denies hunger or cravings.  She is eating 8 oz of lean protein at night.  She is not having any issues with the meal plan.   Subjective:   1. Type 2 diabetes mellitus with other circulatory complication, with long-term current use of insulin (HCC) A1c on 08/06/2021 with PCP was 7.5.  She denies any hunger or cravings.  Chronic diagnosis management per PCP.  She is taking Invokana, Lantus 50 units in the morning, 30 units in the PM, glipizide, metformin and Ozempic 2 mg.  2. Vitamin D deficiency She is currently taking prescription vitamin D 50,000 IU each week. She denies nausea, vomiting or muscle weakness.  Assessment/Plan:  No orders of the defined types were placed in this encounter.   Medications Discontinued During This Encounter  Medication Reason   metFORMIN (GLUCOPHAGE) 500 MG tablet Reorder   ergocalciferol (VITAMIN D2) 1.25 MG (50000 UT) capsule Reorder     Meds ordered this encounter  Medications   ergocalciferol (VITAMIN D2) 1.25 MG (50000 UT) capsule    Sig: 1 q Thursday and 1 q Sun    Dispense:  8 capsule    Refill:  0    30 d supply;  ** OV for RF **   Do not send RF request   metFORMIN (GLUCOPHAGE) 500 MG tablet    Sig: Take 1 tablet (500 mg total) by mouth 2 (two) times daily with a meal.    Dispense:  60 tablet    Refill:  0    30 d supply;   ** OV for RF **   Do not send RF request     1. Type 2 diabetes mellitus with other circulatory complication, with long-term current use of insulin (HCC) Good blood sugar control is important to decrease the likelihood of diabetic complications such as nephropathy, neuropathy, limb loss, blindness, coronary artery disease, and death. Intensive lifestyle modification including diet, exercise and weight loss are the first line of treatment for diabetes. Continue all other medications.   Refill - metFORMIN (GLUCOPHAGE) 500 MG tablet; Take 1 tablet (500 mg total) by mouth 2 (two) times daily with a meal.  Dispense: 60 tablet; Refill: 0  2. Vitamin D deficiency Low Vitamin D level contributes to fatigue and are associated with obesity, breast, and colon cancer. She agrees to continue to take prescription Vitamin D '@50'$ ,000 IU every week and will follow-up for routine testing of Vitamin D, at least 2-3 times per year to avoid over-replacement.  Refill - ergocalciferol (VITAMIN D2) 1.25 MG (50000 UT) capsule; 1 q Thursday and 1 q Sun  Dispense: 8 capsule; Refill: 0  3. Obesity, Current BMI 52.1 [name] is currently in the action stage of change. As such, her goal is to continue with weight loss efforts. She has agreed to the Category 2 Plan.   Exercise goals:  For substantial health benefits, adults should do at least 150 minutes (2 hours and 30 minutes) a week of moderate-intensity, or 75 minutes (1 hour and 15 minutes) a week of vigorous-intensity aerobic physical activity, or an equivalent combination of moderate- and vigorous-intensity aerobic activity. Aerobic activity should be performed in episodes of at least 10 minutes, and preferably, it should be spread throughout the week.  Behavioral modification strategies: increasing water intake and meal planning and cooking strategies.  [name] has agreed to follow-up with our clinic in 3-4 weeks. She was informed of the importance of frequent follow-up  visits to maximize her success with intensive lifestyle modifications for her multiple health conditions.   Objective:   Blood pressure 127/74, pulse 66, temperature 98.3 F (36.8 C), height '5\' 1"'$  (1.549 m), weight 275 lb (124.7 kg), SpO2 98 %. Body mass index is 51.96 kg/m.  General: Cooperative, alert, well developed, in no acute distress. HEENT: Conjunctivae and lids unremarkable. Cardiovascular: Regular rhythm.  Lungs: Normal work of breathing. Neurologic: No focal deficits.   Lab Results  Component Value Date   CREATININE 1.08 (H) 04/29/2021   BUN 27 04/29/2021   NA 144 04/29/2021   K 4.4 04/29/2021   CL 101 04/29/2021   CO2 24 04/29/2021   Lab Results  Component Value Date   ALT 14 04/29/2021   AST 13 04/29/2021   ALKPHOS 101 04/29/2021   BILITOT 0.5 04/29/2021   Lab Results  Component Value Date   HGBA1C 6.6 (H) 08/26/2021   HGBA1C 7.5 (H) 04/29/2021   HGBA1C 7.8 (H) 02/23/2021   HGBA1C 8.7 (H) 05/13/2020   HGBA1C 7.5 (H) 02/04/2020   Lab Results  Component Value Date   INSULIN 10.1 04/29/2021   Lab Results  Component Value Date   TSH 1.320 04/29/2021   Lab Results  Component Value Date   CHOL 127 04/29/2021   HDL 45 04/29/2021   LDLCALC 62 04/29/2021   LDLDIRECT 66 04/09/2009   TRIG 107 04/29/2021   CHOLHDL 3.5 05/13/2020   Lab Results  Component Value Date   VD25OH 64.9 08/26/2021   VD25OH 38.8 04/29/2021   VD25OH 38.0 02/23/2021   Lab Results  Component Value Date   WBC 6.4 10/27/2020   HGB 13.9 10/27/2020   HCT 44 10/27/2020   MCV 85 09/18/2019   PLT 283 10/27/2020   No results found for: "IRON", "TIBC", "FERRITIN"  Attestation Statements:   Reviewed by clinician on day of visit: allergies, medications, problem list, medical history, surgical history, family history, social history, and previous encounter notes.  I, Davy Pique, RMA, am acting as Location manager for [employer], DO.  I have reviewed the above documentation  for accuracy and completeness, and I agree with the above. Marjory Sneddon, D.O.  The [place] was signed into law in 2016 which includes the topic of electronic health records.  This provides immediate access to information in MyChart.  This includes consultation notes, operative notes, office notes, lab results and pathology reports.  If you have any questions about what you read please let us know at your next visit so we can discuss your concerns and take corrective action if need be.  We are right here with you.

## 2021-09-30 DIAGNOSIS — H40053 Ocular hypertension, bilateral: Secondary | ICD-10-CM | POA: Diagnosis not present

## 2021-09-30 DIAGNOSIS — E113293 Type 2 diabetes mellitus with mild nonproliferative diabetic retinopathy without macular edema, bilateral: Secondary | ICD-10-CM | POA: Diagnosis not present

## 2021-10-17 ENCOUNTER — Other Ambulatory Visit (INDEPENDENT_AMBULATORY_CARE_PROVIDER_SITE_OTHER): Payer: Self-pay | Admitting: Family Medicine

## 2021-10-17 DIAGNOSIS — E1159 Type 2 diabetes mellitus with other circulatory complications: Secondary | ICD-10-CM

## 2021-10-19 ENCOUNTER — Ambulatory Visit (INDEPENDENT_AMBULATORY_CARE_PROVIDER_SITE_OTHER): Payer: Medicare HMO | Admitting: Family Medicine

## 2021-10-22 ENCOUNTER — Ambulatory Visit (INDEPENDENT_AMBULATORY_CARE_PROVIDER_SITE_OTHER): Payer: Medicare HMO | Admitting: Family Medicine

## 2021-10-22 ENCOUNTER — Encounter (INDEPENDENT_AMBULATORY_CARE_PROVIDER_SITE_OTHER): Payer: Self-pay | Admitting: Family Medicine

## 2021-10-22 VITALS — BP 136/76 | HR 69 | Temp 98.8°F | Ht 61.0 in | Wt 277.0 lb

## 2021-10-22 DIAGNOSIS — Z7985 Long-term (current) use of injectable non-insulin antidiabetic drugs: Secondary | ICD-10-CM

## 2021-10-22 DIAGNOSIS — Z6841 Body Mass Index (BMI) 40.0 and over, adult: Secondary | ICD-10-CM

## 2021-10-22 DIAGNOSIS — E669 Obesity, unspecified: Secondary | ICD-10-CM | POA: Diagnosis not present

## 2021-10-22 DIAGNOSIS — Z794 Long term (current) use of insulin: Secondary | ICD-10-CM

## 2021-10-22 DIAGNOSIS — Z7984 Long term (current) use of oral hypoglycemic drugs: Secondary | ICD-10-CM

## 2021-10-22 DIAGNOSIS — E1169 Type 2 diabetes mellitus with other specified complication: Secondary | ICD-10-CM | POA: Diagnosis not present

## 2021-10-22 DIAGNOSIS — E559 Vitamin D deficiency, unspecified: Secondary | ICD-10-CM

## 2021-10-22 MED ORDER — ERGOCALCIFEROL 1.25 MG (50000 UT) PO CAPS: ORAL_CAPSULE | ORAL | 0 refills | Status: DC

## 2021-10-22 MED ORDER — METFORMIN HCL 500 MG PO TABS: 500.0000 mg | ORAL_TABLET | Freq: Two times a day (BID) | ORAL | 0 refills | Status: DC

## 2021-10-26 ENCOUNTER — Ambulatory Visit (INDEPENDENT_AMBULATORY_CARE_PROVIDER_SITE_OTHER): Payer: Medicare HMO | Admitting: Physician Assistant

## 2021-10-26 ENCOUNTER — Encounter: Payer: Self-pay | Admitting: Physician Assistant

## 2021-10-26 VITALS — Ht 61.0 in | Wt 277.0 lb

## 2021-10-26 DIAGNOSIS — M1711 Unilateral primary osteoarthritis, right knee: Secondary | ICD-10-CM

## 2021-10-26 DIAGNOSIS — Z6841 Body Mass Index (BMI) 40.0 and over, adult: Secondary | ICD-10-CM

## 2021-10-26 DIAGNOSIS — M1712 Unilateral primary osteoarthritis, left knee: Secondary | ICD-10-CM

## 2021-10-26 MED ORDER — LIDOCAINE HCL 1 % IJ SOLN
3.0000 mL | INTRAMUSCULAR | Status: AC | PRN
  Administered 2021-10-26: 3 mL

## 2021-10-26 MED ORDER — METHYLPREDNISOLONE ACETATE 40 MG/ML IJ SUSP
40.0000 mg | INTRAMUSCULAR | Status: AC | PRN
  Administered 2021-10-26: 40 mg via INTRA_ARTICULAR

## 2021-10-26 NOTE — Progress Notes (Signed)
Office Visit Note   Patient: [name]           Date of Birth: 22-Mar-1961           MRN: [id number] Visit Date: 10/26/2021              Requested by: Nolene Ebbs, MD [address] [place],  [place] [address] PCP: Charlane Ferretti, MD   Assessment & Plan: Visit Diagnoses:  1. Unilateral primary osteoarthritis, left knee   2. Unilateral primary osteoarthritis, right knee     Plan: She will return in 2 weeks to inject the left knee.  She knows to watch her glucose levels over the next couple days closely.  Questions were encouraged and answered.  Follow-Up Instructions: Return in about 2 weeks (around 11/09/2021).   Orders:  Orders Placed This Encounter  Procedures   Large Joint Inj   No orders of the defined types were placed in this encounter.     Procedures: Large Joint Inj: R knee on 10/26/2021 11:15 AM Indications: pain Details: 22 G 1.5 in needle, anterolateral approach  Arthrogram: No  Medications: 3 mL lidocaine 1 %; 40 mg methylPREDNISolone acetate 40 MG/ML Outcome: tolerated well, no immediate complications Procedure, treatment alternatives, risks and benefits explained, specific risks discussed. Consent was given by the patient. Immediately prior to procedure a time out was called to verify the correct patient, procedure, equipment, support staff and site/side marked as required. Patient was prepped and draped in the usual sterile fashion.       Clinical Data: No additional findings.   Subjective: Chief Complaint  Patient presents with   Right Knee - Pain   Left Knee - Pain    HPI [name] comes in today for follow-up.  Bilateral knee arthritis.  She reports she is going to the health and wellness classes and is trying to work on weight loss.  She has had no new injury diet and knee.  She states injections in the past it helped last injection was given the right knee on 04/27/2021 and helped for a few months.  She is now having right greater  than left knee pain.  Notes no new injury to either knee.  She is diabetic but reports that she has good control of her diabetes with hemoglobin A1c of 6.6.  Denies any fevers or chills.  Review of Systems See HPI  Objective: Vital Signs: Ht '5\' 1"'$  (1.549 m)   Wt 277 lb (125.6 kg)   BMI 52.34 kg/m   Physical Exam Constitutional:      Appearance: She is not ill-appearing or diaphoretic.  Pulmonary:     Effort: Pulmonary effort is normal.  Neurological:     Mental Status: She is oriented to person, place, and time.  Psychiatric:        Mood and Affect: Mood normal.        Judgment: Judgment normal.     Ortho Exam Bilateral knees good range of motion both knees.  No instability valgus varus stressing no abnormal warmth erythema of either knee.  Tenderness lateral joint line of both knees.  Specialty Comments:  No specialty comments available.  Imaging: No results found.   PMFS History: Patient Active Problem List   Diagnosis Date Noted   Type 2 diabetes mellitus with obesity ([place]) 08/26/2021   At risk for heart disease 05/25/2021   Benign neoplasm of colon    Depression 06/02/2020   Other chronic pain 05/15/2020   SOB (  shortness of breath) 04/05/2020   Hyperlipidemia associated with type 2 diabetes mellitus ([place]) 02/04/2020   Vitamin D deficiency 01/16/2020   Encounter for screening colonoscopy 01/16/2020   Major depressive disorder, recurrent episode, mild ([place]) 01/12/2020   Hypertension associated with type 2 diabetes mellitus ([place]) 01/02/2020   BMI 50.0-59.9, adult ([place]) 05/21/2019   Chronic pain of left knee 05/21/2019   Chronic pain of both knees 05/21/2019   Unilateral primary osteoarthritis, left knee 05/21/2019   Unilateral primary osteoarthritis, right knee 05/21/2019   ACROCHORDON 11/12/2009   PERIPHERAL EDEMA 08/01/2009   Pain in joint, lower leg 07/07/2009   SINUS TACHYCARDIA 04/09/2009   BACK PAIN, LUMBOSACRAL, CHRONIC 06/17/2008   SHOULDER PAIN,  RIGHT, CHRONIC 05/21/2008   PERIPHERAL NEUROPATHY, MILD 03/05/2008   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   Diabetes mellitus ([place]) 05/19/2006   HYPERCHOLESTEROLEMIA 05/19/2006   OBESITY, NOS 05/19/2006   DEPRESSIVE DISORDER, NOS 05/19/2006   HYPERTENSION, BENIGN SYSTEMIC 05/19/2006   RHINITIS, ALLERGIC 05/19/2006   GASTROESOPHAGEAL REFLUX, NO ESOPHAGITIS 05/19/2006   Past Medical History:  Diagnosis Date   Acid reflux    Arthritis    Back pain    Depression    Diabetes mellitus without complication (HCC)    Edema, lower extremity    Glaucoma    High cholesterol    Hypertension    Joint pain    Nerve damage    Shortness of breath    Vitamin D deficiency     Family History  Problem Relation Age of Onset   Obesity Mother    Eating disorder Mother    Hypertension Father    Hyperlipidemia Father    Alcoholism Father     Past Surgical History:  Procedure Laterality Date   btl     CHOLECYSTECTOMY  2001   COLONOSCOPY WITH PROPOFOL N/A 01/06/2021   Procedure: COLONOSCOPY WITH PROPOFOL;  Surgeon: Yetta Flock, MD;  Location: [facility];  Service: Gastroenterology;  Laterality: N/A;   POLYPECTOMY  01/06/2021   Procedure: POLYPECTOMY;  Surgeon: Yetta Flock, MD;  Location: [facility];  Service: Gastroenterology;;   Social History   Occupational History   Occupation: [employer]  Tobacco Use   Smoking status: Former    Packs/day: 1.00    Types: Cigarettes    Quit date: 2001    Years since quitting: 22.6   Smokeless tobacco: Never  Substance and Sexual Activity   Alcohol use: No   Drug use: No   Sexual activity: Not on file

## 2021-10-28 ENCOUNTER — Ambulatory Visit (INDEPENDENT_AMBULATORY_CARE_PROVIDER_SITE_OTHER): Payer: Medicare HMO | Admitting: Podiatry

## 2021-10-28 ENCOUNTER — Encounter (INDEPENDENT_AMBULATORY_CARE_PROVIDER_SITE_OTHER): Payer: Self-pay

## 2021-10-28 ENCOUNTER — Encounter: Payer: Self-pay | Admitting: Podiatry

## 2021-10-28 DIAGNOSIS — E669 Obesity, unspecified: Secondary | ICD-10-CM

## 2021-10-28 DIAGNOSIS — E1169 Type 2 diabetes mellitus with other specified complication: Secondary | ICD-10-CM | POA: Diagnosis not present

## 2021-10-28 NOTE — Progress Notes (Signed)
This patient presents to the office for diabetic foot exam.   This patient says there is no pain or discomfort in her feet.  No history of infection or drainage.  This patient presents to the office for foot exam due to having a history of diabetes.  Vascular  Dorsalis pedis and posterior tibial pulses are palpable  B/L.  Capillary return  WNL.  Temperature gradient is  WNL.  Skin turgor  WNL  Sensorium  Senn Weinstein monofilament wire  WNL. Normal tactile sensation.  Nail Exam  Patient has normal nails with no evidence of bacterial or fungal infection.  Orthopedic  Exam  Muscle tone and muscle strength  WNL.  No limitations of motion feet  B/L.  No crepitus or joint effusion noted.  Foot type is unremarkable and digits show no abnormalities.  Bony prominences are unremarkable.  Skin  No open lesions.  Normal skin texture and turgor.   Diabetes with no complications  Diabetic foot exam was performed.  There is no evidence of vascular or neurologic pathology.  RTC  1 year.   Gardiner Barefoot DPM

## 2021-10-30 DIAGNOSIS — F419 Anxiety disorder, unspecified: Secondary | ICD-10-CM | POA: Diagnosis not present

## 2021-10-30 DIAGNOSIS — G8929 Other chronic pain: Secondary | ICD-10-CM | POA: Diagnosis not present

## 2021-10-30 DIAGNOSIS — M1991 Primary osteoarthritis, unspecified site: Secondary | ICD-10-CM | POA: Diagnosis not present

## 2021-10-30 DIAGNOSIS — I10 Essential (primary) hypertension: Secondary | ICD-10-CM | POA: Diagnosis not present

## 2021-10-30 DIAGNOSIS — E7800 Pure hypercholesterolemia, unspecified: Secondary | ICD-10-CM | POA: Diagnosis not present

## 2021-10-30 DIAGNOSIS — M545 Low back pain, unspecified: Secondary | ICD-10-CM | POA: Diagnosis not present

## 2021-10-30 DIAGNOSIS — Z794 Long term (current) use of insulin: Secondary | ICD-10-CM | POA: Diagnosis not present

## 2021-10-30 DIAGNOSIS — E1142 Type 2 diabetes mellitus with diabetic polyneuropathy: Secondary | ICD-10-CM | POA: Diagnosis not present

## 2021-10-30 DIAGNOSIS — F3341 Major depressive disorder, recurrent, in partial remission: Secondary | ICD-10-CM | POA: Diagnosis not present

## 2021-10-30 NOTE — Progress Notes (Unsigned)
Chief Complaint:   OBESITY [name] is here to discuss her progress with her obesity treatment plan along with follow-up of her obesity related diagnoses. [name] is on the Category 2 Plan and states she is following her eating plan approximately 85% of the time. [name] states she is walking and house exercises 20-30 minutes 3-4 times per week.  Today's visit was #: 70 Starting weight: 266 lbs Starting date: 09/18/2019 Today's weight: 277 lbs Today's date: 10/22/2021 Total lbs lost to date: 0 Total lbs lost since last in-office visit: +2  Interim History: [name] reports eating exactly on plan. She increased her exercise by 5 more minutes a day but has not journaled yet.  Subjective:   1. Type 2 diabetes mellitus with other specified complication, with long-term current use of insulin ([place]) Discussed labs with patient today. A1c has gone from 7.5 to now 6.6! Pt started with an A1c of 8.7. medication: Lantus 50 units AM and 30 units PM, Glucotrol, Metformin, Ozempic, Invokana  2. Vitamin D deficiency Discussed labs with patient today. Vit D level has gone from 38 to 64. She is currently taking prescription vitamin D 50,000 IU twice a week. She denies nausea, vomiting or muscle weakness.  Assessment/Plan:  No orders of the defined types were placed in this encounter.   Medications Discontinued During This Encounter  Medication Reason   ergocalciferol (VITAMIN D2) 1.25 MG (50000 UT) capsule Reorder   metFORMIN (GLUCOPHAGE) 500 MG tablet Reorder     Meds ordered this encounter  Medications   metFORMIN (GLUCOPHAGE) 500 MG tablet    Sig: Take 1 tablet (500 mg total) by mouth 2 (two) times daily with a meal.    Dispense:  60 tablet    Refill:  0    30 d supply;  ** OV for RF **   Do not send RF request   ergocalciferol (VITAMIN D2) 1.25 MG (50000 UT) capsule    Sig: 1 q Thursday and 1 q Sun    Dispense:  8 capsule    Refill:  0    30 d supply;  ** OV for RF **   Do not  send RF request     1. Type 2 diabetes mellitus with other specified complication, with long-term current use of insulin (HCC) A1c much improved. Good blood sugar control is important to decrease the likelihood of diabetic complications such as nephropathy, neuropathy, limb loss, blindness, coronary artery disease, and death. Intensive lifestyle modification including diet, exercise and weight loss are the first line of treatment for diabetes.  Pt is at highest dose of Ozempic. Continue prudent nutritional plan, current medication regimen, and lifestyle changes.  Refill- metFORMIN (GLUCOPHAGE) 500 MG tablet; Take 1 tablet (500 mg total) by mouth 2 (two) times daily with a meal.  Dispense: 60 tablet; Refill: 0  2. Vitamin D deficiency Low Vitamin D level contributes to fatigue and are associated with obesity, breast, and colon cancer. She agrees to continue to take prescription Vitamin D '@50'$ ,000 IU twice a week and will follow-up for routine testing of Vitamin D, at least 2-3 times per year to avoid over-replacement.  Refill- ergocalciferol (VITAMIN D2) 1.25 MG (50000 UT) capsule; 1 q Thursday and 1 q Sun  Dispense: 8 capsule; Refill: 0  3. Obesity, Current BMI 52.4 [name] is currently in the action stage of change. As such, her goal is to continue with weight loss efforts. She has agreed to the Category 2 Plan and keeping a  food journal and adhering to recommended goals of 1300-1400 calories and 100+ grams protein.   Goal: Drink 7 bottles of water per day.  Exercise goals:  As is  Behavioral modification strategies: increasing lean protein intake and keeping a strict food journal.  [name] has agreed to follow-up with our clinic in 3-4 weeks. She was informed of the importance of frequent follow-up visits to maximize her success with intensive lifestyle modifications for her multiple health conditions.   Objective:   Blood pressure 136/76, pulse 69, temperature 98.8 F (37.1 C), height  '5\' 1"'$  (1.549 m), weight 277 lb (125.6 kg), SpO2 99 %. Body mass index is 52.34 kg/m.  General: Cooperative, alert, well developed, in no acute distress. HEENT: Conjunctivae and lids unremarkable. Cardiovascular: Regular rhythm.  Lungs: Normal work of breathing. Neurologic: No focal deficits.   Lab Results  Component Value Date   CREATININE 1.08 (H) 04/29/2021   BUN 27 04/29/2021   NA 144 04/29/2021   K 4.4 04/29/2021   CL 101 04/29/2021   CO2 24 04/29/2021   Lab Results  Component Value Date   ALT 14 04/29/2021   AST 13 04/29/2021   ALKPHOS 101 04/29/2021   BILITOT 0.5 04/29/2021   Lab Results  Component Value Date   HGBA1C 6.6 (H) 08/26/2021   HGBA1C 7.5 (H) 04/29/2021   HGBA1C 7.8 (H) 02/23/2021   HGBA1C 8.7 (H) 05/13/2020   HGBA1C 7.5 (H) 02/04/2020   Lab Results  Component Value Date   INSULIN 10.1 04/29/2021   Lab Results  Component Value Date   TSH 1.320 04/29/2021   Lab Results  Component Value Date   CHOL 127 04/29/2021   HDL 45 04/29/2021   LDLCALC 62 04/29/2021   LDLDIRECT 66 04/09/2009   TRIG 107 04/29/2021   CHOLHDL 3.5 05/13/2020   Lab Results  Component Value Date   VD25OH 64.9 08/26/2021   VD25OH 38.8 04/29/2021   VD25OH 38.0 02/23/2021   Lab Results  Component Value Date   WBC 6.4 10/27/2020   HGB 13.9 10/27/2020   HCT 44 10/27/2020   MCV 85 09/18/2019   PLT 283 10/27/2020   No results found for: "IRON", "TIBC", "FERRITIN"  Obesity Behavioral Intervention:   Approximately 15 minutes were spent on the discussion below.  ASK: We discussed the diagnosis of obesity with [name] today and [name] agreed to give [place] permission to discuss obesity behavioral modification therapy today.  ASSESS: [name] has the diagnosis of obesity and her BMI today is 52.4. [name] is in the action stage of change.   ADVISE: [name] was educated on the multiple health risks of obesity as well as the benefit of weight loss to improve her health. She  was advised of the need for long term treatment and the importance of lifestyle modifications to improve her current health and to decrease her risk of future health problems.  AGREE: Multiple dietary modification options and treatment options were discussed and [name] agreed to follow the recommendations documented in the above note.  ARRANGE: [name] was educated on the importance of frequent visits to treat obesity as outlined per CMS and USPSTF guidelines and agreed to schedule her next follow up appointment today.  Attestation Statements:   Reviewed by clinician on day of visit: allergies, medications, problem list, medical history, surgical history, family history, social history, and previous encounter notes.  I, [name], BS, CMA, am acting as transcriptionist for [employer], DO.  I have reviewed the above documentation for accuracy and  completeness, and I agree with the above. [name], D.O.  The [place] was signed into law in 2016 which includes the topic of electronic health records.  This provides immediate access to information in MyChart.  This includes consultation notes, operative notes, office notes, lab results and pathology reports.  If you have any questions about what you read please let us know at your next visit so we can discuss your concerns and take corrective action if need be.  We are right here with you.

## 2021-11-09 ENCOUNTER — Encounter: Payer: Self-pay | Admitting: Physician Assistant

## 2021-11-09 ENCOUNTER — Ambulatory Visit (INDEPENDENT_AMBULATORY_CARE_PROVIDER_SITE_OTHER): Payer: Medicare HMO | Admitting: Physician Assistant

## 2021-11-09 DIAGNOSIS — M1712 Unilateral primary osteoarthritis, left knee: Secondary | ICD-10-CM

## 2021-11-09 DIAGNOSIS — M1711 Unilateral primary osteoarthritis, right knee: Secondary | ICD-10-CM | POA: Diagnosis not present

## 2021-11-09 DIAGNOSIS — M170 Bilateral primary osteoarthritis of knee: Secondary | ICD-10-CM

## 2021-11-09 MED ORDER — METHYLPREDNISOLONE ACETATE 40 MG/ML IJ SUSP
40.0000 mg | INTRAMUSCULAR | Status: AC | PRN
  Administered 2021-11-09: 40 mg via INTRA_ARTICULAR

## 2021-11-09 MED ORDER — LIDOCAINE HCL 1 % IJ SOLN
3.0000 mL | INTRAMUSCULAR | Status: AC | PRN
  Administered 2021-11-09: 3 mL

## 2021-11-09 NOTE — Progress Notes (Signed)
   Procedure Note  Patient: [name]             Date of Birth: 11-09-1960           MRN: [id number]             Visit Date: 11/09/2021 [name] comes in today requesting injection in her left knee.  She underwent a right knee cortisone injection 10/26/2021.  And states that the injection in her right knee is helped tremendously with the pain she is having.  Did not greatly affect her glucose level.  Review of systems: Negative for fevers or chills.  Physical exam: Left knee good range of motion.  No abnormal warmth erythema or effusion.  Procedures: Visit Diagnoses:  1. Unilateral primary osteoarthritis, left knee   2. Unilateral primary osteoarthritis, right knee     Large Joint Inj: L knee on 11/09/2021 9:41 AM Indications: pain Details: 22 G 1.5 in needle, anterolateral approach  Arthrogram: No  Medications: 3 mL lidocaine 1 %; 40 mg methylPREDNISolone acetate 40 MG/ML Outcome: tolerated well, no immediate complications Procedure, treatment alternatives, risks and benefits explained, specific risks discussed. Consent was given by the patient. Immediately prior to procedure a time out was called to verify the correct patient, procedure, equipment, support staff and site/side marked as required. Patient was prepped and draped in the usual sterile fashion.    Plan: She will continue work on quad strengthening and knee friendly exercises discussed.  She will follow-up with [place] as needed basis pain persist or becomes worse.  She knows to wait at least 3 months between cortisone injections.  She will monitor her glucose levels closely for the next 24 to 48 hours.

## 2021-11-12 ENCOUNTER — Other Ambulatory Visit (INDEPENDENT_AMBULATORY_CARE_PROVIDER_SITE_OTHER): Payer: Self-pay | Admitting: Family Medicine

## 2021-11-12 ENCOUNTER — Encounter (INDEPENDENT_AMBULATORY_CARE_PROVIDER_SITE_OTHER): Payer: Self-pay | Admitting: Family Medicine

## 2021-11-12 ENCOUNTER — Ambulatory Visit (INDEPENDENT_AMBULATORY_CARE_PROVIDER_SITE_OTHER): Payer: Medicare HMO | Admitting: Family Medicine

## 2021-11-12 VITALS — BP 124/82 | HR 57 | Temp 98.3°F | Ht 61.0 in | Wt 266.0 lb

## 2021-11-12 DIAGNOSIS — Z794 Long term (current) use of insulin: Secondary | ICD-10-CM

## 2021-11-12 DIAGNOSIS — Z7984 Long term (current) use of oral hypoglycemic drugs: Secondary | ICD-10-CM | POA: Diagnosis not present

## 2021-11-12 DIAGNOSIS — E1169 Type 2 diabetes mellitus with other specified complication: Secondary | ICD-10-CM

## 2021-11-12 DIAGNOSIS — E559 Vitamin D deficiency, unspecified: Secondary | ICD-10-CM | POA: Diagnosis not present

## 2021-11-12 DIAGNOSIS — Z6841 Body Mass Index (BMI) 40.0 and over, adult: Secondary | ICD-10-CM | POA: Diagnosis not present

## 2021-11-12 MED ORDER — ERGOCALCIFEROL 1.25 MG (50000 UT) PO CAPS: ORAL_CAPSULE | ORAL | 0 refills | Status: DC

## 2021-11-12 MED ORDER — METFORMIN HCL 500 MG PO TABS: 500.0000 mg | ORAL_TABLET | Freq: Two times a day (BID) | ORAL | 0 refills | Status: DC

## 2021-11-16 ENCOUNTER — Other Ambulatory Visit (INDEPENDENT_AMBULATORY_CARE_PROVIDER_SITE_OTHER): Payer: Self-pay | Admitting: Family Medicine

## 2021-11-16 ENCOUNTER — Other Ambulatory Visit (INDEPENDENT_AMBULATORY_CARE_PROVIDER_SITE_OTHER): Payer: Self-pay | Admitting: Adult Health

## 2021-11-16 DIAGNOSIS — E559 Vitamin D deficiency, unspecified: Secondary | ICD-10-CM

## 2021-11-16 DIAGNOSIS — Z794 Long term (current) use of insulin: Secondary | ICD-10-CM

## 2021-11-19 NOTE — Progress Notes (Signed)
Chief Complaint:   OBESITY [name] is here to discuss her progress with her obesity treatment plan along with follow-up of her obesity related diagnoses. [name] is on the Category 2 Plan and keeping a food journal and adhering to recommended goals of 1300-1400 calories and 100+ grams protein and states she is following her eating plan approximately 10% of the time. [name] states she is walking 40 minutes 4 times per week.  Today's visit was #: 52 Starting weight: 266 lbs Starting date: 09/18/2019 Today's weight: 266 lbs Today's date: 11/12/2021 Total lbs lost to date: 0 Total lbs lost since last in-office visit: 10  Interim History: [name] denies hungry and cravings at all and has much more energy!  She has gained 2.6 lbs in muscle and lost 13 lbs of fat mass. Pt finds following category 2, but journaling it, really helps her.  Subjective:   1. Type 2 diabetes mellitus with other specified complication, with long-term current use of insulin (HCC) Crystals lowest fasting blood sugars 80, 82. She is asymptomatic and blood sugar usually runs around 100-110.  2. Vitamin D deficiency She is currently taking prescription vitamin D 50,000 IU twice a week. She denies nausea, vomiting or muscle weakness.  Assessment/Plan:  No orders of the defined types were placed in this encounter.   Medications Discontinued During This Encounter  Medication Reason   metFORMIN (GLUCOPHAGE) 500 MG tablet Reorder   ergocalciferol (VITAMIN D2) 1.25 MG (50000 UT) capsule Reorder     Meds ordered this encounter  Medications   metFORMIN (GLUCOPHAGE) 500 MG tablet    Sig: Take 1 tablet (500 mg total) by mouth 2 (two) times daily with a meal.    Dispense:  60 tablet    Refill:  0    30 d supply;  ** OV for RF **   Do not send RF request   ergocalciferol (VITAMIN D2) 1.25 MG (50000 UT) capsule    Sig: 1 q Thursday and 1 q Sun    Dispense:  8 capsule    Refill:  0    30 d supply;  ** OV for RF **    Do not send RF request     1. Type 2 diabetes mellitus with other specified complication, with long-term current use of insulin (HCC) Good blood sugar control is important to decrease the likelihood of diabetic complications such as nephropathy, neuropathy, limb loss, blindness, coronary artery disease, and death. Intensive lifestyle modification including diet, exercise and weight loss are the first line of treatment for diabetes. Continue Ozempic 2 mg and Invokamet. Decrease Lantus 2 by 2 units every time fasting blood sugar is less than 110.  Refill- metFORMIN (GLUCOPHAGE) 500 MG tablet; Take 1 tablet (500 mg total) by mouth 2 (two) times daily with a meal.  Dispense: 60 tablet; Refill: 0  2. Vitamin D deficiency Low Vitamin D level contributes to fatigue and are associated with obesity, breast, and colon cancer. She agrees to continue to take prescription Vitamin D '@50'$ ,000 IU twice a week and will follow-up for routine testing of Vitamin D, at least 2-3 times per year to avoid over-replacement.  Refill- ergocalciferol (VITAMIN D2) 1.25 MG (50000 UT) capsule; 1 q Thursday and 1 q Sun  Dispense: 8 capsule; Refill: 0  3. Obesity, Current BMI 50.4 [name] is currently in the action stage of change. As such, her goal is to continue with weight loss efforts. She has agreed to the Category 2 Plan and keeping  a food journal and adhering to recommended goals of 1300-1450 calories and 100+ grams protein.   Exercise goals:  As is  Behavioral modification strategies: increasing lean protein intake, decreasing simple carbohydrates, planning for success, and keeping a strict food journal.  [name] has agreed to follow-up with our clinic in 3 weeks. She was informed of the importance of frequent follow-up visits to maximize her success with intensive lifestyle modifications for her multiple health conditions.   Objective:   Blood pressure 124/82, pulse (!) 57, temperature 98.3 F (36.8 C), height 5'  1" (1.549 m), weight 266 lb (120.7 kg), SpO2 97 %. Body mass index is 50.26 kg/m.  General: Cooperative, alert, well developed, in no acute distress. HEENT: Conjunctivae and lids unremarkable. Cardiovascular: Regular rhythm.  Lungs: Normal work of breathing. Neurologic: No focal deficits.   Lab Results  Component Value Date   CREATININE 1.08 (H) 04/29/2021   BUN 27 04/29/2021   NA 144 04/29/2021   K 4.4 04/29/2021   CL 101 04/29/2021   CO2 24 04/29/2021   Lab Results  Component Value Date   ALT 14 04/29/2021   AST 13 04/29/2021   ALKPHOS 101 04/29/2021   BILITOT 0.5 04/29/2021   Lab Results  Component Value Date   HGBA1C 6.6 (H) 08/26/2021   HGBA1C 7.5 (H) 04/29/2021   HGBA1C 7.8 (H) 02/23/2021   HGBA1C 8.7 (H) 05/13/2020   HGBA1C 7.5 (H) 02/04/2020   Lab Results  Component Value Date   INSULIN 10.1 04/29/2021   Lab Results  Component Value Date   TSH 1.320 04/29/2021   Lab Results  Component Value Date   CHOL 127 04/29/2021   HDL 45 04/29/2021   LDLCALC 62 04/29/2021   LDLDIRECT 66 04/09/2009   TRIG 107 04/29/2021   CHOLHDL 3.5 05/13/2020   Lab Results  Component Value Date   VD25OH 64.9 08/26/2021   VD25OH 38.8 04/29/2021   VD25OH 38.0 02/23/2021   Lab Results  Component Value Date   WBC 6.4 10/27/2020   HGB 13.9 10/27/2020   HCT 44 10/27/2020   MCV 85 09/18/2019   PLT 283 10/27/2020   No results found for: "IRON", "TIBC", "FERRITIN"  Obesity Behavioral Intervention:   Approximately 15 minutes were spent on the discussion below.  ASK: We discussed the diagnosis of obesity with [name] today and [name] agreed to give [place] permission to discuss obesity behavioral modification therapy today.  ASSESS: [name] has the diagnosis of obesity and her BMI today is 50.4. [name] is in the action stage of change.   ADVISE: [name] was educated on the multiple health risks of obesity as well as the benefit of weight loss to improve her health. She was  advised of the need for long term treatment and the importance of lifestyle modifications to improve her current health and to decrease her risk of future health problems.  AGREE: Multiple dietary modification options and treatment options were discussed and V[place]ie agreed to follow the recommendations documented in the above note.  ARRANGE: Naija was educated on the importance of frequent visits to treat obesity as outlined per CMS and USPSTF guidelines and agreed to schedule her next follow up appointment today.  Attestation Statements:   Reviewed by clinician on day of visit: allergies, medications, problem list, medical history, surgical history, family history, social history, and previous encounter notes.  I, Kathlene November, BS, CMA, am acting as transcriptionist for [employer], DO.  I have reviewed the above documentation for accuracy and completeness,  and I agree with the above. Marjory Sneddon, D.O.  The [place] was signed into law in 2016 which includes the topic of electronic health records.  This provides immediate access to information in MyChart.  This includes consultation notes, operative notes, office notes, lab results and pathology reports.  If you have any questions about what you read please let us know at your next visit so we can discuss your concerns and take corrective action if need be.  We are right here with you.

## 2021-12-03 ENCOUNTER — Encounter (INDEPENDENT_AMBULATORY_CARE_PROVIDER_SITE_OTHER): Payer: Self-pay | Admitting: Family Medicine

## 2021-12-03 ENCOUNTER — Ambulatory Visit (INDEPENDENT_AMBULATORY_CARE_PROVIDER_SITE_OTHER): Payer: Medicare HMO | Admitting: Family Medicine

## 2021-12-03 VITALS — BP 130/80 | HR 62 | Temp 98.3°F | Ht 61.0 in | Wt 267.0 lb

## 2021-12-03 DIAGNOSIS — E669 Obesity, unspecified: Secondary | ICD-10-CM | POA: Diagnosis not present

## 2021-12-03 DIAGNOSIS — Z7984 Long term (current) use of oral hypoglycemic drugs: Secondary | ICD-10-CM

## 2021-12-03 DIAGNOSIS — E1169 Type 2 diabetes mellitus with other specified complication: Secondary | ICD-10-CM | POA: Diagnosis not present

## 2021-12-03 DIAGNOSIS — E559 Vitamin D deficiency, unspecified: Secondary | ICD-10-CM

## 2021-12-03 DIAGNOSIS — Z6841 Body Mass Index (BMI) 40.0 and over, adult: Secondary | ICD-10-CM | POA: Diagnosis not present

## 2021-12-03 DIAGNOSIS — Z7985 Long-term (current) use of injectable non-insulin antidiabetic drugs: Secondary | ICD-10-CM

## 2021-12-03 DIAGNOSIS — Z794 Long term (current) use of insulin: Secondary | ICD-10-CM

## 2021-12-03 MED ORDER — ERGOCALCIFEROL 1.25 MG (50000 UT) PO CAPS: ORAL_CAPSULE | ORAL | 0 refills | Status: DC

## 2021-12-03 MED ORDER — METFORMIN HCL 500 MG PO TABS: 500.0000 mg | ORAL_TABLET | Freq: Two times a day (BID) | ORAL | 0 refills | Status: DC

## 2021-12-05 NOTE — Progress Notes (Signed)
Chief Complaint:   OBESITY [name] is here to discuss her progress with her obesity treatment plan along with follow-up of her obesity related diagnoses. [name] is on keeping a food journal and adhering to recommended goals of 1300-1450 calories and 100+ grams of  protein and states she is following her eating plan approximately 90% of the time. [name] states she is walking 15 minutes and exercising in her house for 10-20 minutes 4 times per week.  Today's visit was #: 43 Starting weight: 266 lbs Starting date: 09/18/2019 Today's weight: 267 lbs Today's date: 12/03/2021 Total lbs lost to date: 0 lbs Total lbs lost since last in-office visit: 0 lbs  Interim History: [name] recently had an Uncle to pass and had to go the the funeral for a "celebration of life" and ate with the family ([place] comfort foods). She is still trying to walk in the house/yard weekly. [name] reports no hunger or cravings. She is eating everything on her plan, but occassionally skips meals. [name] is journaling regularly at 1300-1400 calories and 100 grams of protein daily.  Subjective:   1. Type 2 diabetes mellitus with other specified complication, with long-term current use of insulin (HCC) [name] is currently taking Ozempic, Metformin, Glucotrol, and Invokana. She has been out of Lantus for a couple of days. . She will see her PCP in the next day or two.She reports no lows or symptoms, or concerns. Fasting blood sugar = 110 or right around there.  2. Vitamin D deficiency She is currently taking prescription vitamin D 50,000 IU each week. She denies nausea, vomiting or muscle weakness.  Lab Results  Component Value Date   VD25OH 64.9 08/26/2021   VD25OH 38.8 04/29/2021   VD25OH 38.0 02/23/2021   Assessment/Plan:  No orders of the defined types were placed in this encounter.   Medications Discontinued During This Encounter  Medication Reason   metFORMIN (GLUCOPHAGE) 500 MG tablet Reorder    ergocalciferol (VITAMIN D2) 1.25 MG (50000 UT) capsule Reorder     Meds ordered this encounter  Medications   metFORMIN (GLUCOPHAGE) 500 MG tablet    Sig: Take 1 tablet (500 mg total) by mouth 2 (two) times daily with a meal.    Dispense:  60 tablet    Refill:  0    30 d supply;  ** OV for RF **   Do not send RF request   ergocalciferol (VITAMIN D2) 1.25 MG (50000 UT) capsule    Sig: 1 q Thursday and 1 q Sun    Dispense:  8 capsule    Refill:  0    30 d supply;  ** OV for RF **   Do not send RF request     1. Type 2 diabetes mellitus with other specified complication, with long-term current use of insulin ([place]) [name] will continue medications prescribe by her PCP. Her PCP recently refilled Ozempic. She will continue with fasting blood sugar monitoring and continue with her personal nutrion plan. Will refill Metformin as follows: - metFORMIN (GLUCOPHAGE) 500 MG tablet; Take 1 tablet (500 mg total) by mouth 2 (two) times daily with a meal.  Dispense: 60 tablet; Refill: 0  2. Vitamin D deficiency - I discussed the importance of vitamin D to the patient's health and well-being.  - I reviewed possible symptoms of low Vitamin D:  low energy, depressed mood, muscle aches, joint aches, osteoporosis etc. was reviewed with patient - low Vitamin D levels may be linked to an  increased risk of cardiovascular events and even increased risk of cancers- such as colon and breast.  - ideal vitamin D levels reviewed with patient  - I recommend pt take a weekly prescription vit D - see script below   - Informed patient this may be a lifelong thing, and she was encouraged to continue to take the medicine until told otherwise.    - weight loss will likely improve availability of vitamin D, thus encouraged [name] to continue with meal plan and their weight loss efforts to further improve this condition.  Thus, we will need to monitor levels regularly (every 3-4 mo on average) to keep levels within normal  limits and prevent over supplementation. - pt's questions and concerns regarding this condition addressed.  Will refill Vitamin D as follows; - ergocalciferol (VITAMIN D2) 1.25 MG (50000 UT) capsule; 1 q Thursday and 1 q Sun  Dispense: 8 capsule; Refill: 0  3. Obesity, Current BMI 50.6 [name] is currently in the action stage of change. As such, her goal is to continue with weight loss efforts. She has agreed to keeping a food journal and adhering to recommended goals of 1300-1450  calories and 100+ grams protein. [name] will be getting labs at her next PCP office visit in the next couple of days.  Exercise goals: [name] is to get 150 minutes of exercise weekly.  Behavioral modification strategies: planning for success and keeping a strict food journal.  [name] has agreed to follow-up with our clinic in 3 weeks. She was informed of the importance of frequent follow-up visits to maximize her success with intensive lifestyle modifications for her multiple health conditions.   Objective:   Blood pressure 130/80, pulse 62, temperature 98.3 F (36.8 C), height '5\' 1"'$  (1.549 m), weight 267 lb (121.1 kg), SpO2 95 %. Body mass index is 50.45 kg/m.  General: Cooperative, alert, well developed, in no acute distress. HEENT: Conjunctivae and lids unremarkable. Cardiovascular: Regular rhythm.  Lungs: Normal work of breathing. Neurologic: No focal deficits.   Lab Results  Component Value Date   CREATININE 1.08 (H) 04/29/2021   BUN 27 04/29/2021   NA 144 04/29/2021   K 4.4 04/29/2021   CL 101 04/29/2021   CO2 24 04/29/2021   Lab Results  Component Value Date   ALT 14 04/29/2021   AST 13 04/29/2021   ALKPHOS 101 04/29/2021   BILITOT 0.5 04/29/2021   Lab Results  Component Value Date   HGBA1C 6.6 (H) 08/26/2021   HGBA1C 7.5 (H) 04/29/2021   HGBA1C 7.8 (H) 02/23/2021   HGBA1C 8.7 (H) 05/13/2020   HGBA1C 7.5 (H) 02/04/2020   Lab Results  Component Value Date   INSULIN 10.1  04/29/2021   Lab Results  Component Value Date   TSH 1.320 04/29/2021   Lab Results  Component Value Date   CHOL 127 04/29/2021   HDL 45 04/29/2021   LDLCALC 62 04/29/2021   LDLDIRECT 66 04/09/2009   TRIG 107 04/29/2021   CHOLHDL 3.5 05/13/2020   Lab Results  Component Value Date   VD25OH 64.9 08/26/2021   VD25OH 38.8 04/29/2021   VD25OH 38.0 02/23/2021   Lab Results  Component Value Date   WBC 6.4 10/27/2020   HGB 13.9 10/27/2020   HCT 44 10/27/2020   MCV 85 09/18/2019   PLT 283 10/27/2020   No results found for: "IRON", "TIBC", "FERRITIN"  Obesity Behavioral Intervention:   Approximately 15 minutes were spent on the discussion below.  ASK: We discussed the diagnosis  of obesity with Corianna today and [name] agreed to give [place] permission to discuss obesity behavioral modification therapy today.  ASSESS: [name] has the diagnosis of obesity and her BMI today is 50.6. [name] is in the action stage of change.   ADVISE: [name] was educated on the multiple health risks of obesity as well as the benefit of weight loss to improve her health. She was advised of the need for long term treatment and the importance of lifestyle modifications to improve her current health and to decrease her risk of future health problems.  AGREE: Multiple dietary modification options and treatment options were discussed and Zera agreed to follow the recommendations documented in the above note.  ARRANGE: Kiyo was educated on the importance of frequent visits to treat obesity as outlined per CMS and USPSTF guidelines and agreed to schedule her next follow up appointment today.  Attestation Statements:   Reviewed by clinician on day of visit: allergies, medications, problem list, medical history, surgical history, family history, social history, and previous encounter notes.  ILennette Bihari, CMA, am acting as transcriptionist for Dr. Raliegh Scarlet, DO  I have reviewed the above  documentation for accuracy and completeness, and I agree with the above. Marjory Sneddon, D.O.  The [place] was signed into law in 2016 which includes the topic of electronic health records.  This provides immediate access to information in MyChart.  This includes consultation notes, operative notes, office notes, lab results and pathology reports.  If you have any questions about what you read please let us know at your next visit so we can discuss your concerns and take corrective action if need be.  We are right here with you.

## 2021-12-11 DIAGNOSIS — Z0000 Encounter for general adult medical examination without abnormal findings: Secondary | ICD-10-CM | POA: Diagnosis not present

## 2021-12-11 DIAGNOSIS — F3341 Major depressive disorder, recurrent, in partial remission: Secondary | ICD-10-CM | POA: Diagnosis not present

## 2021-12-11 DIAGNOSIS — E1142 Type 2 diabetes mellitus with diabetic polyneuropathy: Secondary | ICD-10-CM | POA: Diagnosis not present

## 2021-12-11 DIAGNOSIS — M1991 Primary osteoarthritis, unspecified site: Secondary | ICD-10-CM | POA: Diagnosis not present

## 2021-12-11 DIAGNOSIS — Z23 Encounter for immunization: Secondary | ICD-10-CM | POA: Diagnosis not present

## 2021-12-11 DIAGNOSIS — E559 Vitamin D deficiency, unspecified: Secondary | ICD-10-CM | POA: Diagnosis not present

## 2021-12-11 DIAGNOSIS — E7800 Pure hypercholesterolemia, unspecified: Secondary | ICD-10-CM | POA: Diagnosis not present

## 2021-12-11 DIAGNOSIS — F419 Anxiety disorder, unspecified: Secondary | ICD-10-CM | POA: Diagnosis not present

## 2021-12-11 DIAGNOSIS — I10 Essential (primary) hypertension: Secondary | ICD-10-CM | POA: Diagnosis not present

## 2021-12-11 DIAGNOSIS — K219 Gastro-esophageal reflux disease without esophagitis: Secondary | ICD-10-CM | POA: Diagnosis not present

## 2021-12-28 ENCOUNTER — Encounter (INDEPENDENT_AMBULATORY_CARE_PROVIDER_SITE_OTHER): Payer: Self-pay | Admitting: Family Medicine

## 2021-12-28 ENCOUNTER — Ambulatory Visit (INDEPENDENT_AMBULATORY_CARE_PROVIDER_SITE_OTHER): Payer: Medicare HMO | Admitting: Family Medicine

## 2021-12-28 VITALS — BP 121/76 | HR 61 | Temp 98.1°F | Ht 61.0 in | Wt 267.0 lb

## 2021-12-28 DIAGNOSIS — Z7984 Long term (current) use of oral hypoglycemic drugs: Secondary | ICD-10-CM | POA: Diagnosis not present

## 2021-12-28 DIAGNOSIS — Z6841 Body Mass Index (BMI) 40.0 and over, adult: Secondary | ICD-10-CM

## 2021-12-28 DIAGNOSIS — E669 Obesity, unspecified: Secondary | ICD-10-CM | POA: Diagnosis not present

## 2021-12-28 DIAGNOSIS — E1169 Type 2 diabetes mellitus with other specified complication: Secondary | ICD-10-CM | POA: Diagnosis not present

## 2021-12-28 DIAGNOSIS — Z794 Long term (current) use of insulin: Secondary | ICD-10-CM

## 2021-12-28 DIAGNOSIS — Z7985 Long-term (current) use of injectable non-insulin antidiabetic drugs: Secondary | ICD-10-CM

## 2021-12-28 DIAGNOSIS — E559 Vitamin D deficiency, unspecified: Secondary | ICD-10-CM

## 2021-12-28 MED ORDER — SEMAGLUTIDE (2 MG/DOSE) 8 MG/3ML ~~LOC~~ SOPN: 2.0000 mg | PEN_INJECTOR | SUBCUTANEOUS | 0 refills | Status: DC

## 2021-12-28 MED ORDER — ERGOCALCIFEROL 1.25 MG (50000 UT) PO CAPS: ORAL_CAPSULE | ORAL | 0 refills | Status: DC

## 2021-12-28 MED ORDER — METFORMIN HCL 500 MG PO TABS: 500.0000 mg | ORAL_TABLET | Freq: Two times a day (BID) | ORAL | 0 refills | Status: DC

## 2022-01-04 NOTE — Progress Notes (Unsigned)
Chief Complaint:   OBESITY [name] is here to discuss her progress with her obesity treatment plan along with follow-up of her obesity related diagnoses. [name] is on keeping a food journal and adhering to recommended goals of 1300-1450 calories and 100+ grams protein and states she is following her eating plan approximately 90% of the time. [name] states she is walking 30 minutes 4 times per week.  Today's visit was #: 16 Starting weight: 266 lbs Starting date: 09/18/2019 Today's weight: 267 lbs Today's date: 12/28/2021 Total lbs lost to date: 0 Total lbs lost since last in-office visit: 0  Interim History: [name] did journal for 4 days. She ate 1200 calories a day and 66 grams of protein. She also recently had labs with her [facility] PCP but forgot to bring results in today. Pt has no issues with meal plan.  Subjective:   1. Type 2 diabetes mellitus with other specified complication, with long-term current use of insulin (HCC) Pt's highest fasting blood sugar at home was in the 130's and the lowest was 85. She states she has good control of hunger and cravings.  2. Vitamin D deficiency [name] is tolerating medication(s) well without side effects.  Medication compliance is good as patient endorses taking it as prescribed.  The patient denies additional concerns regarding this condition.      Assessment/Plan:  No orders of the defined types were placed in this encounter.   Medications Discontinued During This Encounter  Medication Reason   Semaglutide, 2 MG/DOSE, 8 MG/3ML SOPN Reorder   metFORMIN (GLUCOPHAGE) 500 MG tablet Reorder   ergocalciferol (VITAMIN D2) 1.25 MG (50000 UT) capsule Reorder     Meds ordered this encounter  Medications   metFORMIN (GLUCOPHAGE) 500 MG tablet    Sig: Take 1 tablet (500 mg total) by mouth 2 (two) times daily with a meal.    Dispense:  60 tablet    Refill:  0    30 d supply;  ** OV for RF **   Do not send RF request    Semaglutide, 2 MG/DOSE, 8 MG/3ML SOPN    Sig: Inject 2 mg as directed once a week.    Dispense:  9 mL    Refill:  0    90 d supply;  ** OV for RF **   Do not send RF request   ergocalciferol (VITAMIN D2) 1.25 MG (50000 UT) capsule    Sig: 1 q Thursday and 1 q Sun    Dispense:  8 capsule    Refill:  0    30 d supply;  ** OV for RF **   Do not send RF request     1. Type 2 diabetes mellitus with other specified complication, with long-term current use of insulin ([place]) - Counseled patient on pathophysiology of disease and discussed how good blood sugar control is important to decrease the risk of diabetic complications such as nephropathy, neuropathy, limb loss, blindness, coronary artery disease, etc.   - Intensive lifestyle modification including diet, exercise and weight loss are the first line of treatment for diabetes. We extensively discussed the importance of decreasing simple carbs and how certain foods they eat will affect their blood sugars - Reminded [name] if she feels poorly- check Blood Sugar and Blood Pressure at that time.    - Hypoglycemia prevention discussed with the patient.  Eat on a regular basis- no skipping or going long periods without eating.     -  Recommend that any concerns about medicines should be directed at the prescribing provider - Recheck labs in 3 months if not done at Endo provider / PCP.  - Importance of f/up with PCP and all other specialists, as scheduled, was stressed to the patient today  Lab Results  Component Value Date   INSULIN 10.1 04/29/2021   Lab Results  Component Value Date   HGBA1C 6.6 (H) 08/26/2021   HGBA1C 7.5 (H) 04/29/2021   HGBA1C 7.8 (H) 02/23/2021   Refill- metFORMIN (GLUCOPHAGE) 500 MG tablet; Take 1 tablet (500 mg total) by mouth 2 (two) times daily with a meal.  Dispense: 60 tablet; Refill: 0 Refill- Semaglutide, 2 MG/DOSE, 8 MG/3ML SOPN; Inject 2 mg as directed once a week.  Dispense: 9 mL; Refill: 0  2. Vitamin D  deficiency - I again reiterated the importance of vitamin D (as well as calcium) to their health and wellbeing.  - I reviewed possible symptoms of low Vitamin D:  low energy, depressed mood, muscle aches, joint aches, osteoporosis etc. - low Vitamin D levels may be linked to an increased risk of cardiovascular events and even increased risk of cancers- such as colon and breast.  - ideal vitamin D levels reviewed with patient  - I recommend pt take a 50,000 IU twice weekly prescription vit D - see script below   - Informed patient this may be a lifelong thing, and she was encouraged to continue to take the medicine until told otherwise.    - weight loss will likely improve availability of vitamin D, thus encouraged [name] to continue with meal plan and their weight loss efforts to further improve this condition.  Thus, we will need to monitor levels regularly (every 3-4 mo on average) to keep levels within normal limits and prevent over supplementation. - pt's questions and concerns regarding this condition addressed.  Refill- ergocalciferol (VITAMIN D2) 1.25 MG (50000 UT) capsule; 1 q Thursday and 1 q Sun  Dispense: 8 capsule; Refill: 0  3. Obesity, Current BMI 50.5 [name] is currently in the action stage of change. As such, her goal is to continue with weight loss efforts. She has agreed to keeping a food journal and adhering to recommended goals of [phone] calories and 100+ grams protein.   [name] will journal intake and bring log to next OV.  We requested lab results from PCP.  Exercise goals: For substantial health benefits, adults should do at least 150 minutes (2 hours and 30 minutes) a week of moderate-intensity, or 75 minutes (1 hour and 15 minutes) a week of vigorous-intensity aerobic physical activity, or an equivalent combination of moderate- and vigorous-intensity aerobic activity. Aerobic activity should be performed in episodes of at least 10 minutes, and preferably, it should be  spread throughout the week.  Behavioral modification strategies: increasing lean protein intake, decreasing simple carbohydrates, and keeping a strict food journal.  [name] has agreed to follow-up with our clinic in 3-4 weeks. She was informed of the importance of frequent follow-up visits to maximize her success with intensive lifestyle modifications for her multiple health conditions.   Objective:   Blood pressure 121/76, pulse 61, temperature 98.1 F (36.7 C), height '5\' 1"'$  (1.549 m), weight 267 lb (121.1 kg), SpO2 97 %. Body mass index is 50.45 kg/m.  General: Cooperative, alert, well developed, in no acute distress. HEENT: Conjunctivae and lids unremarkable. Cardiovascular: Regular rhythm.  Lungs: Normal work of breathing. Neurologic: No focal deficits.   Lab Results  Component Value Date  CREATININE 1.08 (H) 04/29/2021   BUN 27 04/29/2021   NA 144 04/29/2021   K 4.4 04/29/2021   CL 101 04/29/2021   CO2 24 04/29/2021   Lab Results  Component Value Date   ALT 14 04/29/2021   AST 13 04/29/2021   ALKPHOS 101 04/29/2021   BILITOT 0.5 04/29/2021   Lab Results  Component Value Date   HGBA1C 6.6 (H) 08/26/2021   HGBA1C 7.5 (H) 04/29/2021   HGBA1C 7.8 (H) 02/23/2021   HGBA1C 8.7 (H) 05/13/2020   HGBA1C 7.5 (H) 02/04/2020   Lab Results  Component Value Date   INSULIN 10.1 04/29/2021   Lab Results  Component Value Date   TSH 1.320 04/29/2021   Lab Results  Component Value Date   CHOL 127 04/29/2021   HDL 45 04/29/2021   LDLCALC 62 04/29/2021   LDLDIRECT 66 04/09/2009   TRIG 107 04/29/2021   CHOLHDL 3.5 05/13/2020   Lab Results  Component Value Date   VD25OH 64.9 08/26/2021   VD25OH 38.8 04/29/2021   VD25OH 38.0 02/23/2021   Lab Results  Component Value Date   WBC 6.4 10/27/2020   HGB 13.9 10/27/2020   HCT 44 10/27/2020   MCV 85 09/18/2019   PLT 283 10/27/2020   No results found for: "IRON", "TIBC", "FERRITIN"  Obesity Behavioral Intervention:    Approximately 15 minutes were spent on the discussion below.  ASK: We discussed the diagnosis of obesity with [name] today and [name] agreed to give [place] permission to discuss obesity behavioral modification therapy today.  ASSESS: [name] has the diagnosis of obesity and her BMI today is 50.5. [name] is in the action stage of change.   ADVISE: [name] was educated on the multiple health risks of obesity as well as the benefit of weight loss to improve her health. She was advised of the need for long term treatment and the importance of lifestyle modifications to improve her current health and to decrease her risk of future health problems.  AGREE: Multiple dietary modification options and treatment options were discussed and Seeley agreed to follow the recommendations documented in the above note.  ARRANGE: Joory was educated on the importance of frequent visits to treat obesity as outlined per CMS and USPSTF guidelines and agreed to schedule her next follow up appointment today.  Attestation Statements:   Reviewed by clinician on day of visit: allergies, medications, problem list, medical history, surgical history, family history, social history, and previous encounter notes.  I, Kathlene November, BS, CMA, am acting as transcriptionist for [employer], DO.  I have reviewed the above documentation for accuracy and completeness, and I agree with the above. [name], D.O.  The [place] was signed into law in 2016 which includes the topic of electronic health records.  This provides immediate access to information in MyChart.  This includes consultation notes, operative notes, office notes, lab results and pathology reports.  If you have any questions about what you read please let us know at your next visit so we can discuss your concerns and take corrective action if need be.  We are right here with you.

## 2022-01-21 ENCOUNTER — Encounter (INDEPENDENT_AMBULATORY_CARE_PROVIDER_SITE_OTHER): Payer: Self-pay | Admitting: Family Medicine

## 2022-01-21 ENCOUNTER — Ambulatory Visit (INDEPENDENT_AMBULATORY_CARE_PROVIDER_SITE_OTHER): Payer: Medicare HMO | Admitting: Family Medicine

## 2022-01-21 VITALS — BP 109/65 | HR 58 | Temp 98.1°F | Ht 61.0 in | Wt 272.0 lb

## 2022-01-21 DIAGNOSIS — E559 Vitamin D deficiency, unspecified: Secondary | ICD-10-CM

## 2022-01-21 DIAGNOSIS — E669 Obesity, unspecified: Secondary | ICD-10-CM

## 2022-01-21 DIAGNOSIS — Z7985 Long-term (current) use of injectable non-insulin antidiabetic drugs: Secondary | ICD-10-CM | POA: Diagnosis not present

## 2022-01-21 DIAGNOSIS — E1169 Type 2 diabetes mellitus with other specified complication: Secondary | ICD-10-CM

## 2022-01-21 DIAGNOSIS — Z7984 Long term (current) use of oral hypoglycemic drugs: Secondary | ICD-10-CM | POA: Diagnosis not present

## 2022-01-21 DIAGNOSIS — Z794 Long term (current) use of insulin: Secondary | ICD-10-CM

## 2022-01-21 DIAGNOSIS — Z6841 Body Mass Index (BMI) 40.0 and over, adult: Secondary | ICD-10-CM

## 2022-01-21 MED ORDER — ERGOCALCIFEROL 1.25 MG (50000 UT) PO CAPS: ORAL_CAPSULE | ORAL | 0 refills | Status: DC

## 2022-01-21 MED ORDER — METFORMIN HCL 500 MG PO TABS: 500.0000 mg | ORAL_TABLET | Freq: Two times a day (BID) | ORAL | 0 refills | Status: DC

## 2022-01-21 MED ORDER — SEMAGLUTIDE (2 MG/DOSE) 8 MG/3ML ~~LOC~~ SOPN: 2.0000 mg | PEN_INJECTOR | SUBCUTANEOUS | 0 refills | Status: DC

## 2022-01-31 NOTE — Progress Notes (Signed)
Chief Complaint:   OBESITY [name] is here to discuss her progress with her obesity treatment plan along with follow-up of her obesity related diagnoses. [name] is on keeping a food journal and adhering to recommended goals of 1300-1450 calories and 100+ grams protein and states she is following her eating plan approximately 90% of the time. [name] states she is walking 30 minutes 4 times per week.  Today's visit was #: 42 Starting weight: 266 lbs Starting date: 09/18/2019 Today's weight: 272 lbs Today's date: 01/21/2022 Total lbs lost to date: 0 Total lbs lost since last in-office visit: +5  Interim History: [name] went out of town to a funeral and ate more than usual. Her starting weight here on 09/18/2019 was 166 lbs. Weight is now 172.  Subjective:   1. Type 2 diabetes mellitus with other specified complication, with long-term current use of insulin (HCC) Diabetes Mellitus: Not at goal. Medication: Ozempic and Metformin. Issues reviewed: blood sugar goals, complications of diabetes mellitus, hypoglycemia prevention and treatment, exercise, and nutrition.   2. Vitamin D deficiency She is currently taking prescription vitamin D 50,000 IU twice a week. She denies nausea, vomiting or muscle weakness.  Assessment/Plan:  No orders of the defined types were placed in this encounter.   Medications Discontinued During This Encounter  Medication Reason   metFORMIN (GLUCOPHAGE) 500 MG tablet Reorder   Semaglutide, 2 MG/DOSE, 8 MG/3ML SOPN Reorder   ergocalciferol (VITAMIN D2) 1.25 MG (50000 UT) capsule Reorder     Meds ordered this encounter  Medications   Semaglutide, 2 MG/DOSE, 8 MG/3ML SOPN    Sig: Inject 2 mg as directed once a week.    Dispense:  9 mL    Refill:  0    90 d supply;  ** OV for RF **   Do not send RF request   metFORMIN (GLUCOPHAGE) 500 MG tablet    Sig: Take 1 tablet (500 mg total) by mouth 2 (two) times daily with a meal.    Dispense:  60 tablet     Refill:  0    30 d supply;  ** OV for RF **   Do not send RF request   ergocalciferol (VITAMIN D2) 1.25 MG (50000 UT) capsule    Sig: 1 q Thursday and 1 q Sun    Dispense:  8 capsule    Refill:  0    30 d supply;  ** OV for RF **   Do not send RF request     1. Type 2 diabetes mellitus with other specified complication, with long-term current use of insulin (HCC) Good blood sugar control is important to decrease the likelihood of diabetic complications such as nephropathy, neuropathy, limb loss, blindness, coronary artery disease, and death. Intensive lifestyle modification including diet, exercise and weight loss are the first line of treatment for diabetes.   Refill- Semaglutide, 2 MG/DOSE, 8 MG/3ML SOPN; Inject 2 mg as directed once a week.  Dispense: 9 mL; Refill: 0 Refill- metFORMIN (GLUCOPHAGE) 500 MG tablet; Take 1 tablet (500 mg total) by mouth 2 (two) times daily with a meal.  Dispense: 60 tablet; Refill: 0  2. Vitamin D deficiency Low Vitamin D level contributes to fatigue and are associated with obesity, breast, and colon cancer. She agrees to continue to take prescription Vitamin D '@50'$ ,000 IU twice a week and will follow-up for routine testing of Vitamin D, at least 2-3 times per year to avoid over-replacement.  Refill- ergocalciferol (VITAMIN D2)  1.25 MG (50000 UT) capsule; 1 q Thursday and 1 q Sun  Dispense: 8 capsule; Refill: 0  3. Obesity, Current BMI 51.4 [name] is currently in the action stage of change. As such, her goal is to continue with weight loss efforts. She has agreed to keeping a food journal and adhering to recommended goals of 1300-1450 calories and 100+ grams protein.   Pt's only job is to track and journal every thing she eats and bring in log to next OV.  Exercise goals:  As is  Behavioral modification strategies: increasing lean protein intake, decreasing simple carbohydrates, and avoiding temptations.  [name] has agreed to follow-up with our clinic in  4 weeks. She was informed of the importance of frequent follow-up visits to maximize her success with intensive lifestyle modifications for her multiple health conditions.   Objective:   Blood pressure 109/65, pulse (!) 58, temperature 98.1 F (36.7 C), height '5\' 1"'$  (1.549 m), weight 272 lb (123.4 kg), SpO2 97 %. Body mass index is 51.39 kg/m.  General: Cooperative, alert, well developed, in no acute distress. HEENT: Conjunctivae and lids unremarkable. Cardiovascular: Regular rhythm.  Lungs: Normal work of breathing. Neurologic: No focal deficits.   Lab Results  Component Value Date   CREATININE 1.08 (H) 04/29/2021   BUN 27 04/29/2021   NA 144 04/29/2021   K 4.4 04/29/2021   CL 101 04/29/2021   CO2 24 04/29/2021   Lab Results  Component Value Date   ALT 14 04/29/2021   AST 13 04/29/2021   ALKPHOS 101 04/29/2021   BILITOT 0.5 04/29/2021   Lab Results  Component Value Date   HGBA1C 6.6 (H) 08/26/2021   HGBA1C 7.5 (H) 04/29/2021   HGBA1C 7.8 (H) 02/23/2021   HGBA1C 8.7 (H) 05/13/2020   HGBA1C 7.5 (H) 02/04/2020   Lab Results  Component Value Date   INSULIN 10.1 04/29/2021   Lab Results  Component Value Date   TSH 1.320 04/29/2021   Lab Results  Component Value Date   CHOL 127 04/29/2021   HDL 45 04/29/2021   LDLCALC 62 04/29/2021   LDLDIRECT 66 04/09/2009   TRIG 107 04/29/2021   CHOLHDL 3.5 05/13/2020   Lab Results  Component Value Date   VD25OH 64.9 08/26/2021   VD25OH 38.8 04/29/2021   VD25OH 38.0 02/23/2021   Lab Results  Component Value Date   WBC 6.4 10/27/2020   HGB 13.9 10/27/2020   HCT 44 10/27/2020   MCV 85 09/18/2019   PLT 283 10/27/2020   Attestation Statements:   Reviewed by clinician on day of visit: allergies, medications, problem list, medical history, surgical history, family history, social history, and previous encounter notes.  I, Kathlene November, BS, CMA, am acting as transcriptionist for [employer], DO.   I have  reviewed the above documentation for accuracy and completeness, and I agree with the above. Marjory Sneddon, D.O.  The [place] was signed into law in 2016 which includes the topic of electronic health records.  This provides immediate access to information in MyChart.  This includes consultation notes, operative notes, office notes, lab results and pathology reports.  If you have any questions about what you read please let us know at your next visit so we can discuss your concerns and take corrective action if need be.  We are right here with you.

## 2022-02-08 ENCOUNTER — Encounter: Payer: Self-pay | Admitting: Physician Assistant

## 2022-02-08 ENCOUNTER — Ambulatory Visit (INDEPENDENT_AMBULATORY_CARE_PROVIDER_SITE_OTHER): Payer: Medicare HMO | Admitting: Physician Assistant

## 2022-02-08 DIAGNOSIS — M1712 Unilateral primary osteoarthritis, left knee: Secondary | ICD-10-CM

## 2022-02-08 MED ORDER — METHYLPREDNISOLONE ACETATE 40 MG/ML IJ SUSP
40.0000 mg | INTRAMUSCULAR | Status: AC | PRN
  Administered 2022-02-08: 40 mg via INTRA_ARTICULAR

## 2022-02-08 MED ORDER — LIDOCAINE HCL 1 % IJ SOLN
3.0000 mL | INTRAMUSCULAR | Status: AC | PRN
  Administered 2022-02-08: 3 mL

## 2022-02-08 NOTE — Progress Notes (Signed)
   Procedure Note  Patient: [name]             Date of Birth: 30-Aug-1960           MRN: [id number]             Visit Date: 02/08/2022 HPI: Mrs. Ragin comes in today requesting left knee injection.  She has known osteoarthritis left knee.  Denies any injury to the left knee.  She states that her right knee overall is doing well.  She last had a left knee injection on 11/09/2021.  She is diabetic and reports her hemoglobin A1c to be 6.5.  She denies any fevers chills or ongoing infections.  She does note that she is walking more for exercise.  Review of systems: See HPI otherwise negative  Physical exam: General well-developed well-nourished female no acute distress ambulates without any assistive device. Left knee: No abnormal warmth erythema or effusion.  Good range of motion.  Tenderness along medial joint line no instability valgus varus stressing. Procedures: Visit Diagnoses:  1. Unilateral primary osteoarthritis, left knee     Large Joint Inj: L knee on 02/08/2022 11:58 AM Indications: pain Details: 22 G 1.5 in needle, anterolateral approach  Arthrogram: No  Medications: 3 mL lidocaine 1 %; 40 mg methylPREDNISolone acetate 40 MG/ML Outcome: tolerated well, no immediate complications Procedure, treatment alternatives, risks and benefits explained, specific risks discussed. Consent was given by the patient. Immediately prior to procedure a time out was called to verify the correct patient, procedure, equipment, support staff and site/side marked as required. Patient was prepped and draped in the usual sterile fashion.     Plan: She will follow-up with [place] as needed knows to watch her glucose levels over the next 24 to 48 hours.  Questions were encouraged and answered at length.

## 2022-02-15 ENCOUNTER — Other Ambulatory Visit (INDEPENDENT_AMBULATORY_CARE_PROVIDER_SITE_OTHER): Payer: Self-pay | Admitting: Family Medicine

## 2022-02-15 DIAGNOSIS — E559 Vitamin D deficiency, unspecified: Secondary | ICD-10-CM

## 2022-02-21 ENCOUNTER — Other Ambulatory Visit (INDEPENDENT_AMBULATORY_CARE_PROVIDER_SITE_OTHER): Payer: Self-pay | Admitting: Family Medicine

## 2022-02-21 DIAGNOSIS — E1169 Type 2 diabetes mellitus with other specified complication: Secondary | ICD-10-CM

## 2022-02-25 ENCOUNTER — Encounter (INDEPENDENT_AMBULATORY_CARE_PROVIDER_SITE_OTHER): Payer: Self-pay | Admitting: Family Medicine

## 2022-02-25 ENCOUNTER — Ambulatory Visit (INDEPENDENT_AMBULATORY_CARE_PROVIDER_SITE_OTHER): Payer: Medicare HMO | Admitting: Family Medicine

## 2022-02-25 VITALS — BP 132/83 | HR 57 | Temp 98.1°F | Ht 61.0 in | Wt 269.0 lb

## 2022-02-25 DIAGNOSIS — Z6841 Body Mass Index (BMI) 40.0 and over, adult: Secondary | ICD-10-CM

## 2022-02-25 DIAGNOSIS — E1169 Type 2 diabetes mellitus with other specified complication: Secondary | ICD-10-CM | POA: Diagnosis not present

## 2022-02-25 DIAGNOSIS — E669 Obesity, unspecified: Secondary | ICD-10-CM | POA: Diagnosis not present

## 2022-02-25 DIAGNOSIS — Z7984 Long term (current) use of oral hypoglycemic drugs: Secondary | ICD-10-CM | POA: Diagnosis not present

## 2022-02-25 DIAGNOSIS — E559 Vitamin D deficiency, unspecified: Secondary | ICD-10-CM | POA: Diagnosis not present

## 2022-03-05 DIAGNOSIS — L304 Erythema intertrigo: Secondary | ICD-10-CM | POA: Diagnosis not present

## 2022-03-05 DIAGNOSIS — E7800 Pure hypercholesterolemia, unspecified: Secondary | ICD-10-CM | POA: Diagnosis not present

## 2022-03-05 DIAGNOSIS — E1142 Type 2 diabetes mellitus with diabetic polyneuropathy: Secondary | ICD-10-CM | POA: Diagnosis not present

## 2022-03-05 DIAGNOSIS — R0683 Snoring: Secondary | ICD-10-CM | POA: Diagnosis not present

## 2022-03-05 DIAGNOSIS — I10 Essential (primary) hypertension: Secondary | ICD-10-CM | POA: Diagnosis not present

## 2022-03-16 ENCOUNTER — Other Ambulatory Visit (INDEPENDENT_AMBULATORY_CARE_PROVIDER_SITE_OTHER): Payer: Self-pay | Admitting: Family Medicine

## 2022-03-16 DIAGNOSIS — E1169 Type 2 diabetes mellitus with other specified complication: Secondary | ICD-10-CM

## 2022-03-16 DIAGNOSIS — E1142 Type 2 diabetes mellitus with diabetic polyneuropathy: Secondary | ICD-10-CM | POA: Diagnosis not present

## 2022-03-16 DIAGNOSIS — I10 Essential (primary) hypertension: Secondary | ICD-10-CM | POA: Diagnosis not present

## 2022-03-16 MED ORDER — METFORMIN HCL 500 MG PO TABS: 500.0000 mg | ORAL_TABLET | Freq: Two times a day (BID) | ORAL | 0 refills | Status: DC

## 2022-03-17 ENCOUNTER — Other Ambulatory Visit: Payer: Self-pay | Admitting: Internal Medicine

## 2022-03-17 DIAGNOSIS — Z1231 Encounter for screening mammogram for malignant neoplasm of breast: Secondary | ICD-10-CM

## 2022-03-24 DIAGNOSIS — G4733 Obstructive sleep apnea (adult) (pediatric): Secondary | ICD-10-CM | POA: Diagnosis not present

## 2022-03-25 DIAGNOSIS — G4733 Obstructive sleep apnea (adult) (pediatric): Secondary | ICD-10-CM | POA: Diagnosis not present

## 2022-03-31 ENCOUNTER — Ambulatory Visit (INDEPENDENT_AMBULATORY_CARE_PROVIDER_SITE_OTHER): Payer: Medicare HMO | Admitting: Family Medicine

## 2022-03-31 ENCOUNTER — Encounter (INDEPENDENT_AMBULATORY_CARE_PROVIDER_SITE_OTHER): Payer: Self-pay | Admitting: Family Medicine

## 2022-03-31 VITALS — BP 143/84 | HR 58 | Temp 98.4°F | Ht 61.0 in | Wt 272.4 lb

## 2022-03-31 DIAGNOSIS — E559 Vitamin D deficiency, unspecified: Secondary | ICD-10-CM

## 2022-03-31 DIAGNOSIS — Z7985 Long-term (current) use of injectable non-insulin antidiabetic drugs: Secondary | ICD-10-CM | POA: Diagnosis not present

## 2022-03-31 DIAGNOSIS — E1169 Type 2 diabetes mellitus with other specified complication: Secondary | ICD-10-CM

## 2022-03-31 DIAGNOSIS — Z794 Long term (current) use of insulin: Secondary | ICD-10-CM | POA: Diagnosis not present

## 2022-03-31 DIAGNOSIS — Z6841 Body Mass Index (BMI) 40.0 and over, adult: Secondary | ICD-10-CM | POA: Diagnosis not present

## 2022-03-31 DIAGNOSIS — Z7984 Long term (current) use of oral hypoglycemic drugs: Secondary | ICD-10-CM | POA: Diagnosis not present

## 2022-03-31 DIAGNOSIS — E669 Obesity, unspecified: Secondary | ICD-10-CM

## 2022-03-31 DIAGNOSIS — G4733 Obstructive sleep apnea (adult) (pediatric): Secondary | ICD-10-CM | POA: Diagnosis not present

## 2022-03-31 MED ORDER — ERGOCALCIFEROL 1.25 MG (50000 UT) PO CAPS: ORAL_CAPSULE | ORAL | 0 refills | Status: DC

## 2022-03-31 MED ORDER — METFORMIN HCL 500 MG PO TABS: ORAL_TABLET | ORAL | 3 refills | Status: DC

## 2022-04-01 DIAGNOSIS — G4733 Obstructive sleep apnea (adult) (pediatric): Secondary | ICD-10-CM | POA: Insufficient documentation

## 2022-04-03 NOTE — Progress Notes (Signed)
Chief Complaint:   OBESITY [name] is here to discuss her progress with her obesity treatment plan along with follow-up of her obesity related diagnoses. [name] is on keeping a food journal and adhering to recommended goals of 1300-1450 calories and 100+ protein and states she is following her eating plan approximately 85% of the time. [name] states she is walking 30 minutes 5 times per week.  Today's visit was #: 76 Starting weight: 266 lbs Starting date: 09/18/2019 Today's weight: 269 lbs Today's date: 02/25/2022 Total lbs lost to date: 0 Total lbs lost since last in-office visit: 3 LBS  Interim History: He has been over 2 years and 4 months since patient started with our program.  She brought in her journal and every day she took in 1276 cal and 153 g of protein for 1286 cal and 152.9 g of protein.  Nothing different even over Thanksgiving etc.  No cravings or hunger.  Subjective:   1. Vitamin D deficiency Kellin A Chrestman is tolerating medication(s) well without side effects.  Medication compliance is good as patient endorses taking it as prescribed.  Symptoms are stable and the patient denies additional concerns regarding this condition.  Last vitamin D level 54.9.  2. Type 2 diabetes mellitus with obesity ([place]) Patient gets her Lantus and Glucotrol per PCP.  Patient is tolerating Ozempic well no need for refill.  But needs refill of her metformin.  A1c controlled at 6.6.  Patient is taking Lantus, Glucotrol, Ozempic and metformin.  Assessment/Plan:  No orders of the defined types were placed in this encounter.   Medications Discontinued During This Encounter  Medication Reason   metFORMIN (GLUCOPHAGE) 500 MG tablet Reorder     Meds ordered this encounter  Medications   DISCONTD: metFORMIN (GLUCOPHAGE) 500 MG tablet    Sig: Take 1 tablet (500 mg total) by mouth 2 (two) times daily with a meal.    Dispense:  60 tablet    Refill:  0    30 d supply;  ** OV for RF **    Do not send RF request     1. Vitamin D deficiency Continue ergocalciferol twice weekly.  Patient denies need for a refill today.  Vitamin D is at goal.  Patient will obtain vitamin D labs with her PCP in the near future.  2. Type 2 diabetes mellitus with obesity ([place]) Refill metformin twice daily 500 mg and continue Ozempic.  Patient needs labs but per patient she has seen her PCP soon and will have labs then.  Continue PNP and weight loss.  3. Obesity, Current BMI 50.8 Patient has follow-up with her PCP this month legal physician and will bring in her labs at next office visit.  She had labs done 3 months ago.  Kanita is currently in the action stage of change. As such, her goal is to continue with weight loss efforts. She has agreed to keeping a food journal and adhering to recommended goals of 1300-1450 calories and 100+ protein.   Exercise goals:  As is.  Behavioral modification strategies: keeping a strict food journal.  Focus on journaling any extra calories, or snacks  Ailee has agreed to follow-up with our clinic in 4 weeks. She was informed of the importance of frequent follow-up visits to maximize her success with intensive lifestyle modifications for her multiple health conditions.   Objective:   Blood pressure 132/83, pulse (!) 57, temperature 98.1 F (36.7 C), height '5\' 1"'$  (1.549 m), weight 269  lb (122 kg), SpO2 96 %. Body mass index is 50.83 kg/m.  General: Cooperative, alert, well developed, in no acute distress. HEENT: Conjunctivae and lids unremarkable. Cardiovascular: Regular rhythm.  Lungs: Normal work of breathing. Neurologic: No focal deficits.   Lab Results  Component Value Date   CREATININE 1.08 (H) 04/29/2021   BUN 27 04/29/2021   NA 144 04/29/2021   K 4.4 04/29/2021   CL 101 04/29/2021   CO2 24 04/29/2021   Lab Results  Component Value Date   ALT 14 04/29/2021   AST 13 04/29/2021   ALKPHOS 101 04/29/2021   BILITOT 0.5 04/29/2021   Lab  Results  Component Value Date   HGBA1C 6.6 (H) 08/26/2021   HGBA1C 7.5 (H) 04/29/2021   HGBA1C 7.8 (H) 02/23/2021   HGBA1C 8.7 (H) 05/13/2020   HGBA1C 7.5 (H) 02/04/2020   Lab Results  Component Value Date   INSULIN 10.1 04/29/2021   Lab Results  Component Value Date   TSH 1.320 04/29/2021   Lab Results  Component Value Date   CHOL 127 04/29/2021   HDL 45 04/29/2021   LDLCALC 62 04/29/2021   LDLDIRECT 66 04/09/2009   TRIG 107 04/29/2021   CHOLHDL 3.5 05/13/2020   Lab Results  Component Value Date   VD25OH 64.9 08/26/2021   VD25OH 38.8 04/29/2021   VD25OH 38.0 02/23/2021   Lab Results  Component Value Date   WBC 6.4 10/27/2020   HGB 13.9 10/27/2020   HCT 44 10/27/2020   MCV 85 09/18/2019   PLT 283 10/27/2020   No results found for: "IRON", "TIBC", "FERRITIN"  Attestation Statements:   Reviewed by clinician on day of visit: allergies, medications, problem list, medical history, surgical history, family history, social history, and previous encounter notes.  I, Davy Pique, RMA, am acting as Location manager for [employer], DO.  I have reviewed the above documentation for accuracy and completeness, and I agree with the above. Marjory Sneddon, D.O.  The [place] was signed into law in 2016 which includes the topic of electronic health records.  This provides immediate access to information in MyChart.  This includes consultation notes, operative notes, office notes, lab results and pathology reports.  If you have any questions about what you read please let us know at your next visit so we can discuss your concerns and take corrective action if need be.  We are right here with you.

## 2022-04-16 NOTE — Progress Notes (Unsigned)
Chief Complaint:   OBESITY [name] is here to discuss her progress with her obesity treatment plan along with follow-up of her obesity related diagnoses. [name] is on keeping a food journal and adhering to recommended goals of 1300-1450 calories and 100+ grams protein and states she is following her eating plan approximately 85% of the time. [name] states she is walking and stretching 15 minutes 7 times per week.  Today's visit was #: 61 Starting weight: 266 lbs Starting date: 09/18/2019 Today's weight: 272 lbs Today's date: 03/31/2022 Total lbs lost to date: 0 Total lbs lost since last in-office visit: +3  Interim History: [name] ate off plan for a few days over the holidays. She did not journal every day and did not bring log in today.  Subjective:   1. Type 2 diabetes mellitus with other specified complication, with long-term current use of insulin (HCC) Worsening. Discussed labs with patient today. [name]'s A1c was worse with PCP recently on 03/16/22 at 7.1 (prior 6.6). She declines need for Ozempic refill but need Metformin. Pt tolerating meds well with no side effects.  2. Vitamin D deficiency Vitamin D 6 months ago was 64.9. [name] has been off supplement for 1-2 months because insurance won't pay for Ergocalciferol, and she does not want to take OTC daily Vitamin D.  3. OSA (obstructive sleep apnea) Pt recently went for a sleep study with PCP, as we recommended. Study reveals OSA and pt needs CPAP titration study now.  Assessment/Plan:  No orders of the defined types were placed in this encounter.   Medications Discontinued During This Encounter  Medication Reason   Insulin Syringe-Needle U-100 30G X 1/2" 1 ML MISC Completed Course   metFORMIN (GLUCOPHAGE) 500 MG tablet    ergocalciferol (VITAMIN D2) 1.25 MG (50000 UT) capsule Reorder   metFORMIN (GLUCOPHAGE) 500 MG tablet Reorder     Meds ordered this encounter  Medications   metFORMIN (GLUCOPHAGE) 500 MG  tablet    Sig: TAKE 1 TABLET TWICE DAILY WITH MEALS    Dispense:  60 tablet    Refill:  3   ergocalciferol (VITAMIN D2) 1.25 MG (50000 UT) capsule    Sig: 1 q Thursday and 1 q Sun    Dispense:  8 capsule    Refill:  0    30 d supply;  ** OV for RF **   Do not send RF request     1. Type 2 diabetes mellitus with other specified complication, with long-term current use of insulin (HCC) Continue all meds as directed. Extensive discussion with pt regarding following meal plan and increasing awareness of how foods will affect her health.   Refill- metFORMIN (GLUCOPHAGE) 500 MG tablet; TAKE 1 TABLET TWICE DAILY WITH MEALS  Dispense: 60 tablet; Refill: 3  2. Vitamin D deficiency Use GoodRx or Rx Saver to see about discount for med.   Refill- ergocalciferol (VITAMIN D2) 1.25 MG (50000 UT) capsule; 1 q Thursday and 1 q Sun  Dispense: 8 capsule; Refill: 0  3. OSA (obstructive sleep apnea) Follow up for CPAP titration study. Counseling done on the importance of nightly use of CPAP and the impact on weight loss journey and quality of life.  4. Obesity, Current BMI 51.5 [name] is currently in the action stage of change. As such, her goal is to continue with weight loss efforts. She has agreed to the Category 2 Plan with breakfast and lunch options but journal intake each day.   Bring in log  of total protein and calories each day. Showed pt how to journal and use app or write down by hand. Also reviewed how to calculate totals with pt.  Exercise goals:  Increase as tolerated.  Behavioral modification strategies: keeping a strict food journal.  [name] has agreed to follow-up with our clinic in 3 weeks. She was informed of the importance of frequent follow-up visits to maximize her success with intensive lifestyle modifications for her multiple health conditions.   Objective:   Blood pressure (!) 143/84, pulse (!) 58, temperature 98.4 F (36.9 C), height '5\' 1"'$  (1.549 m), weight 272 lb 6.4 oz  (123.6 kg), SpO2 99 %. Body mass index is 51.47 kg/m.  General: Cooperative, alert, well developed, in no acute distress. HEENT: Conjunctivae and lids unremarkable. Cardiovascular: Regular rhythm.  Lungs: Normal work of breathing. Neurologic: No focal deficits.   Lab Results  Component Value Date   CREATININE 1.08 (H) 04/29/2021   BUN 27 04/29/2021   NA 144 04/29/2021   K 4.4 04/29/2021   CL 101 04/29/2021   CO2 24 04/29/2021   Lab Results  Component Value Date   ALT 14 04/29/2021   AST 13 04/29/2021   ALKPHOS 101 04/29/2021   BILITOT 0.5 04/29/2021   Lab Results  Component Value Date   HGBA1C 6.6 (H) 08/26/2021   HGBA1C 7.5 (H) 04/29/2021   HGBA1C 7.8 (H) 02/23/2021   HGBA1C 8.7 (H) 05/13/2020   HGBA1C 7.5 (H) 02/04/2020   Lab Results  Component Value Date   INSULIN 10.1 04/29/2021   Lab Results  Component Value Date   TSH 1.320 04/29/2021   Lab Results  Component Value Date   CHOL 127 04/29/2021   HDL 45 04/29/2021   LDLCALC 62 04/29/2021   LDLDIRECT 66 04/09/2009   TRIG 107 04/29/2021   CHOLHDL 3.5 05/13/2020   Lab Results  Component Value Date   VD25OH 64.9 08/26/2021   VD25OH 38.8 04/29/2021   VD25OH 38.0 02/23/2021   Lab Results  Component Value Date   WBC 6.4 10/27/2020   HGB 13.9 10/27/2020   HCT 44 10/27/2020   MCV 85 09/18/2019   PLT 283 10/27/2020   Attestation Statements:   Reviewed by clinician on day of visit: allergies, medications, problem list, medical history, surgical history, family history, social history, and previous encounter notes.  Time spent on visit including pre-visit chart review and post-visit care and charting was 40 minutes.   I, Kathlene November, BS, CMA, am acting as transcriptionist for [employer], DO.   I have reviewed the above documentation for accuracy and completeness, and I agree with the above. Marjory Sneddon, D.O.  The [place] was signed into law in 2016 which includes the  topic of electronic health records.  This provides immediate access to information in MyChart.  This includes consultation notes, operative notes, office notes, lab results and pathology reports.  If you have any questions about what you read please let us know at your next visit so we can discuss your concerns and take corrective action if need be.  We are right here with you.

## 2022-04-18 ENCOUNTER — Other Ambulatory Visit (INDEPENDENT_AMBULATORY_CARE_PROVIDER_SITE_OTHER): Payer: Self-pay | Admitting: Family Medicine

## 2022-04-18 DIAGNOSIS — E559 Vitamin D deficiency, unspecified: Secondary | ICD-10-CM

## 2022-05-03 ENCOUNTER — Ambulatory Visit (INDEPENDENT_AMBULATORY_CARE_PROVIDER_SITE_OTHER): Payer: Medicare HMO | Admitting: Family Medicine

## 2022-05-03 ENCOUNTER — Encounter (INDEPENDENT_AMBULATORY_CARE_PROVIDER_SITE_OTHER): Payer: Self-pay | Admitting: Family Medicine

## 2022-05-03 VITALS — BP 131/85 | HR 67 | Temp 98.7°F | Ht 61.0 in | Wt 268.8 lb

## 2022-05-03 DIAGNOSIS — Z7984 Long term (current) use of oral hypoglycemic drugs: Secondary | ICD-10-CM | POA: Diagnosis not present

## 2022-05-03 DIAGNOSIS — Z7985 Long-term (current) use of injectable non-insulin antidiabetic drugs: Secondary | ICD-10-CM

## 2022-05-03 DIAGNOSIS — Z794 Long term (current) use of insulin: Secondary | ICD-10-CM

## 2022-05-03 DIAGNOSIS — I152 Hypertension secondary to endocrine disorders: Secondary | ICD-10-CM

## 2022-05-03 DIAGNOSIS — E559 Vitamin D deficiency, unspecified: Secondary | ICD-10-CM

## 2022-05-03 DIAGNOSIS — E1159 Type 2 diabetes mellitus with other circulatory complications: Secondary | ICD-10-CM

## 2022-05-03 DIAGNOSIS — Z6841 Body Mass Index (BMI) 40.0 and over, adult: Secondary | ICD-10-CM

## 2022-05-03 DIAGNOSIS — E1169 Type 2 diabetes mellitus with other specified complication: Secondary | ICD-10-CM | POA: Diagnosis not present

## 2022-05-03 MED ORDER — ERGOCALCIFEROL 1.25 MG (50000 UT) PO CAPS: ORAL_CAPSULE | ORAL | 0 refills | Status: DC

## 2022-05-04 LAB — COMPREHENSIVE METABOLIC PANEL
ALT: 14 IU/L (ref 0–32)
AST: 16 IU/L (ref 0–40)
Albumin/Globulin Ratio: 1.3 (ref 1.2–2.2)
Albumin: 4.4 g/dL (ref 3.9–4.9)
Alkaline Phosphatase: 98 IU/L (ref 44–121)
BUN/Creatinine Ratio: 25 (ref 12–28)
BUN: 25 mg/dL (ref 8–27)
Bilirubin Total: 0.5 mg/dL (ref 0.0–1.2)
CO2: 26 mmol/L (ref 20–29)
Calcium: 10.2 mg/dL (ref 8.7–10.3)
Chloride: 100 mmol/L (ref 96–106)
Creatinine, Ser: 1.01 mg/dL — ABNORMAL HIGH (ref 0.57–1.00)
Globulin, Total: 3.5 g/dL (ref 1.5–4.5)
Glucose: 87 mg/dL (ref 70–99)
Potassium: 4.2 mmol/L (ref 3.5–5.2)
Sodium: 144 mmol/L (ref 134–144)
Total Protein: 7.9 g/dL (ref 6.0–8.5)
eGFR: 62 mL/min/{1.73_m2} (ref >59)

## 2022-05-04 LAB — HEMOGLOBIN A1C
Est. average glucose Bld gHb Est-mCnc: 154 mg/dL
Hgb A1c MFr Bld: 7 % — ABNORMAL HIGH (ref 4.8–5.6)

## 2022-05-04 LAB — LIPID PANEL
Chol/HDL Ratio: 3.3 ratio (ref 0.0–4.4)
Cholesterol, Total: 145 mg/dL (ref 100–199)
HDL: 44 mg/dL (ref >39)
LDL Chol Calc (NIH): 78 mg/dL (ref 0–99)
Triglycerides: 127 mg/dL (ref 0–149)
VLDL Cholesterol Cal: 23 mg/dL (ref 5–40)

## 2022-05-04 LAB — INSULIN, RANDOM: INSULIN: 23.8 u[IU]/mL (ref 2.6–24.9)

## 2022-05-04 LAB — VITAMIN D 25 HYDROXY (VIT D DEFICIENCY, FRACTURES): Vit D, 25-Hydroxy: 52.7 ng/mL (ref 30.0–100.0)

## 2022-05-06 ENCOUNTER — Ambulatory Visit
Admission: RE | Admit: 2022-05-06 | Discharge: 2022-05-06 | Disposition: A | Discharge status: Home / Self Care | Payer: Medicare HMO | Source: Ambulatory Visit | Attending: Internal Medicine | Admitting: Internal Medicine

## 2022-05-06 DIAGNOSIS — Z1231 Encounter for screening mammogram for malignant neoplasm of breast: Secondary | ICD-10-CM | POA: Diagnosis not present

## 2022-05-13 NOTE — Progress Notes (Signed)
Chief Complaint:   OBESITY [name] is here to discuss her progress with her obesity treatment plan along with follow-up of her obesity related diagnoses. [name] is on the Category 2 Plan and states she is following her eating plan approximately 85% of the time. [name] states she is walking and flexing 15 minutes 7 times per week.  Today's visit was #: 19 Starting weight: 256 LBS Starting date: 09/18/2019 Today's weight: 272 LBS Today's date: 05/03/2022 Total lbs lost to date: 0 Total lbs lost since last in-office visit: 4 LBS  Interim History: Patient does Silver sneakers classes daily from home via video.  Has been even using hand weights and exercise ball for three weeks.  Plans to increase time. She logged eating out more per week.    Subjective:   1. Type 2 diabetes mellitus with other specified complication, with long-term current use of insulin (HCC) Last A1c here was 6.6.  Fasting blood sugar 111 120.  Patient is taking Lipitor nightly and tolerating well.  Patient is taking Invokana, Glucotrol, Lantus 46 units every morning and 24 units every afternoon metformin, Ozempic.  2. Vitamin D deficiency She is currently taking prescription vitamin D 50,000 IU 2 times per  week. She denies nausea, vomiting or muscle weakness.  Compliance is good.  3. Hypertension associated with type 2 diabetes mellitus ([place]) Patient is taking Lopressor, Lasix, Accuretic.  Patient is asymptomatic with no concerns.  Assessment/Plan:   Orders Placed This Encounter  Procedures   VITAMIN D 25 Hydroxy (Vit-D Deficiency, Fractures)   Lipid panel   Comprehensive metabolic panel   Hemoglobin A1c   Insulin, random    Medications Discontinued During This Encounter  Medication Reason   ergocalciferol (VITAMIN D2) 1.25 MG (50000 UT) capsule Reorder   ergocalciferol (VITAMIN D2) 1.25 MG (50000 UT) capsule Reorder   losartan-hydrochlorothiazide (HYZAAR) 100-25 MG tablet Completed Course     Meds  ordered this encounter  Medications   DISCONTD: ergocalciferol (VITAMIN D2) 1.25 MG (50000 UT) capsule    Sig: 1 q Thursday and 1 q Sun    Dispense:  8 capsule    Refill:  0    30 d supply;  ** OV for RF **   Do not send RF request   ergocalciferol (VITAMIN D2) 1.25 MG (50000 UT) capsule    Sig: 1 q Thursday and 1 q Sun    Dispense:  8 capsule    Refill:  0    30 d supply;  ** OV for RF **   Do not send RF request     1. Type 2 diabetes mellitus with other specified complication, with long-term current use of insulin (HCC) Check labs today.  Continue medication and wean insulin as needed.  Continue PNP, weight loss and increase exercise. - Lipid panel - Hemoglobin A1c - Insulin, random  2. Vitamin D deficiency Check labs today.  - VITAMIN D 25 Hydroxy (Vit-D Deficiency, Fractures)  Refill- ergocalciferol (VITAMIN D2) 1.25 MG (50000 UT) capsule; 1 q Thursday and 1 q Sun  Dispense: 8 capsule; Refill: 0  3. Hypertension associated with type 2 diabetes mellitus ([place]) Check labs today.  Blood pressure at goal.  Continue treatment plan to lose weight and increase exercise. - Comprehensive metabolic panel  4. BMI 50.0-59.9, adult (HCC)-current bmi 50.8  5. Morbid obesity (HCC)-start bmi 51.47 Log everything you eat and bring it in to the next office visit.   [name] is currently in the action stage  of change. As such, her goal is to continue with weight loss efforts. She has agreed to the Category 2 Plan.   Exercise goals:  As is.  Behavioral modification strategies: increasing lean protein intake and planning for success.  [name] has agreed to follow-up with our clinic in 3 weeks. She was informed of the importance of frequent follow-up visits to maximize her success with intensive lifestyle modifications for her multiple health conditions.   Objective:   Blood pressure 131/85, pulse 67, temperature 98.7 F (37.1 C), height '5\' 1"'$  (1.549 m), weight 268 lb 12.8 oz (121.9 kg),  SpO2 97 %. Body mass index is 50.79 kg/m.  General: Cooperative, alert, well developed, in no acute distress. HEENT: Conjunctivae and lids unremarkable. Cardiovascular: Regular rhythm.  Lungs: Normal work of breathing. Neurologic: No focal deficits.   Lab Results  Component Value Date   CREATININE 1.01 (H) 05/03/2022   BUN 25 05/03/2022   NA 144 05/03/2022   K 4.2 05/03/2022   CL 100 05/03/2022   CO2 26 05/03/2022   Lab Results  Component Value Date   ALT 14 05/03/2022   AST 16 05/03/2022   ALKPHOS 98 05/03/2022   BILITOT 0.5 05/03/2022   Lab Results  Component Value Date   HGBA1C 7.0 (H) 05/03/2022   HGBA1C 6.6 (H) 08/26/2021   HGBA1C 7.5 (H) 04/29/2021   HGBA1C 7.8 (H) 02/23/2021   HGBA1C 8.7 (H) 05/13/2020   Lab Results  Component Value Date   INSULIN 23.8 05/03/2022   INSULIN 10.1 04/29/2021   Lab Results  Component Value Date   TSH 1.320 04/29/2021   Lab Results  Component Value Date   CHOL 145 05/03/2022   HDL 44 05/03/2022   LDLCALC 78 05/03/2022   LDLDIRECT 66 04/09/2009   TRIG 127 05/03/2022   CHOLHDL 3.3 05/03/2022   Lab Results  Component Value Date   VD25OH 52.7 05/03/2022   VD25OH 64.9 08/26/2021   VD25OH 38.8 04/29/2021   Lab Results  Component Value Date   WBC 6.4 10/27/2020   HGB 13.9 10/27/2020   HCT 44 10/27/2020   MCV 85 09/18/2019   PLT 283 10/27/2020   No results found for: "IRON", "TIBC", "FERRITIN"  Attestation Statements:   Reviewed by clinician on day of visit: allergies, medications, problem list, medical history, surgical history, family history, social history, and previous encounter notes.  I, Davy Pique, RMA, am acting as Location manager for [employer], DO.   I have reviewed the above documentation for accuracy and completeness, and I agree with the above. [name], D.O.  [facility] was signed into law in 2016 which includes the topic of electronic health records.  This provides  immediate access to information in MyChart.  This includes consultation notes, operative notes, office notes, lab results and pathology reports.  If you have any questions about what you read please let us know at your next visit so we can discuss your concerns and take corrective action if need be.  We are right here with you.

## 2022-05-21 ENCOUNTER — Other Ambulatory Visit (INDEPENDENT_AMBULATORY_CARE_PROVIDER_SITE_OTHER): Payer: Self-pay | Admitting: Family Medicine

## 2022-05-21 DIAGNOSIS — E559 Vitamin D deficiency, unspecified: Secondary | ICD-10-CM

## 2022-05-27 ENCOUNTER — Encounter: Payer: Self-pay | Admitting: Radiology

## 2022-05-31 ENCOUNTER — Ambulatory Visit (INDEPENDENT_AMBULATORY_CARE_PROVIDER_SITE_OTHER): Payer: Medicare HMO | Admitting: Family Medicine

## 2022-05-31 ENCOUNTER — Encounter (INDEPENDENT_AMBULATORY_CARE_PROVIDER_SITE_OTHER): Payer: Self-pay | Admitting: Family Medicine

## 2022-05-31 VITALS — BP 119/78 | HR 61 | Temp 98.1°F | Ht 61.0 in | Wt 265.4 lb

## 2022-05-31 DIAGNOSIS — E785 Hyperlipidemia, unspecified: Secondary | ICD-10-CM | POA: Diagnosis not present

## 2022-05-31 DIAGNOSIS — E1159 Type 2 diabetes mellitus with other circulatory complications: Secondary | ICD-10-CM

## 2022-05-31 DIAGNOSIS — Z6841 Body Mass Index (BMI) 40.0 and over, adult: Secondary | ICD-10-CM | POA: Diagnosis not present

## 2022-05-31 DIAGNOSIS — I152 Hypertension secondary to endocrine disorders: Secondary | ICD-10-CM

## 2022-05-31 DIAGNOSIS — G4733 Obstructive sleep apnea (adult) (pediatric): Secondary | ICD-10-CM

## 2022-05-31 DIAGNOSIS — Z794 Long term (current) use of insulin: Secondary | ICD-10-CM | POA: Diagnosis not present

## 2022-05-31 DIAGNOSIS — E1169 Type 2 diabetes mellitus with other specified complication: Secondary | ICD-10-CM | POA: Diagnosis not present

## 2022-05-31 MED ORDER — SEMAGLUTIDE (2 MG/DOSE) 8 MG/3ML ~~LOC~~ SOPN: 2.0000 mg | PEN_INJECTOR | SUBCUTANEOUS | 0 refills | Status: DC

## 2022-05-31 NOTE — Progress Notes (Signed)
[name], D.O.  ABFM, ABOM Specializing in Clinical Bariatric Medicine  Office located at: [address], [place]  [address]     Assessment and Plan:   No orders of the defined types were placed in this encounter.   Medications Discontinued During This Encounter  Medication Reason   Semaglutide, 2 MG/DOSE, 8 MG/3ML SOPN Reorder     Meds ordered this encounter  Medications   Semaglutide, 2 MG/DOSE, 8 MG/3ML SOPN    Sig: Inject 2 mg as directed once a week.    Dispense:  9 mL    Refill:  0    90 d supply;  ** OV for RF **   Do not send RF request     Hypertension associated with type 2 diabetes mellitus ([place]) Assessment: Condition is At goal.. She is compliant Metoprolol '50mg'$  BID. BP Readings from Last 3 Encounters:  05/31/22 119/78  05/03/22 131/85  03/31/22 (!) 143/84   Lab Results  Component Value Date   CREATININE 1.01 (H) 05/03/2022   BUN 25 05/03/2022   NA 144 05/03/2022   K 4.2 05/03/2022   CL 100 05/03/2022   CO2 26 05/03/2022   Plan:BP is at goal today.  Counseled [name] on pathophysiology of disease and discussed treatment plan, which always includes dietary and lifestyle modification as first line.  Lifestyle changes such as following our low salt, heart healthy meal plan and engaging in a regular exercise program discussed  - Avoid buying foods that are: processed, frozen, or prepackaged to avoid excess salt. - Ambulatory blood pressure monitoring encouraged.  Reminded patient that if they ever feel poorly in any way, to check their blood pressure and pulse as well. - We will continue to monitor closely alongside PCP/ specialists.  Pt reminded to also f/up with those individuals as instructed by them.  - We will continue to monitor symptoms as they relate to the her weight loss [name].    Hyperlipidemia associated with type 2 diabetes mellitus ([place]) Assessment: Condition is At goal.. Labs were reviewed. Extensive  discussion was had with patient regarding how the foods that they eat affect their labs.  Lab Results  Component Value Date   CHOL 145 05/03/2022   HDL 44 05/03/2022   LDLCALC 78 05/03/2022   LDLDIRECT 66 04/09/2009   TRIG 127 05/03/2022   CHOLHDL 3.3 05/03/2022  LDL and TRIG slightly higher than last lipids. However, cholesterol is at goal.  Plan:[name] agrees to continue with meds and/or our treatment plan of a heart-heathy, low cholesterol meal plan - We recommend: aerobic activity with eventual goal of a minimum of 150+ min wk plus 2 days/ week of resistance or strength training.   - Cardiovascular risk and specific lipid/LDL goals reviewed. - We extensively discussed several lifestyle modifications today and [name] will continue to work on diet, exercise and weight loss efforts.  - I stressed the importance that patient continue with our prudent nutritional plan that is low in saturated and trans fats, and low in fatty carbs to improve these numbers.   - We will continue routine screening as patient continues to achieve health goals along their weight loss [name] Last lipid panel as above.    Type 2 diabetes mellitus with other specified complication, with long-term current use of insulin (HCC) Assessment: Condition is Worsening.. Labs were reviewed. Extensive discussion was had with patient regarding how the foods that they eat affect their labs.  Since her  last A1c check, she states that she has had better blood sugars at home. Her PCP decreased her Lantus due to this improvement. BGL has been between 110-125 fasting. Lab Results  Component Value Date   HGBA1C 7.0 (H) 05/03/2022   HGBA1C 6.6 (H) 08/26/2021   HGBA1C 7.5 (H) 04/29/2021   INSULIN 23.8 05/03/2022   INSULIN 10.1 04/29/2021   Plan:Continue Ozempic '2mg'$  weekly and Metformin '500mg'$  BID.   OSA (obstructive sleep apnea) Assessment: Condition is Controlled.. She reports compliance with CPAP nightly with good  control.  Plan: Continue nightly CPAP use   BMI 50.0-59.9, adult (HCC)-current bmi 50.1 Morbid obesity (HCC)-start bmi 50.26/date 09/18/19 Assessment: Condition is Improving, but not optimized.. Biometric data collected today, was reviewed with patient.  Muscle mass is down .4lb, fat mass is down 2.8lbs, and total body water is down 1.8lbs. She has done well on Ozempic '2mg'$  weekly with good appetite and craving control. She denies any negative side effects.  Plan:Continue Ozempic '2mg'$  weekly- refill provided today.   - Continue all other meds per PCP/ specialists  - Intensive lifestyle modification including diet, exercise and weight loss are the first line of treatment for diabetes. We extensively discussed the importance of decreasing simple carbs and how certain foods they eat will affect their blood sugars - Reminded [name] if she feels poorly- check Blood Sugar and Blood Pressure at that time.    - Hypoglycemia prevention discussed with the patient.  Eat on a regular basis- no skipping or going long periods without eating.     - Recommend that any concerns about medicines should be directed at the prescribing provider - Importance of f/up with PCP and all other specialists, as scheduled, was stressed to the patient today       TREATMENT PLAN FOR OBESITY:  Recommended Dietary Goals [name] is currently in the action stage of change. As such, her goal is to continue weight management plan. She has agreed to continue the Category 2 Plan.  Behavioral Intervention We discussed the following Behavioral Modification Strategies today: increasing lean protein intake, increasing vegetables, work on meal planning and easy cooking plans, and work on tracking and journaling calories using tracking App. Additional resources provided today:  patient declined Evidence-based interventions for health behavior change were utilized today including the discussion of self monitoring techniques,  problem-solving barriers and SMART goal setting techniques.   Regarding patient's less desirable eating habits and patterns, we employed the technique of small changes.  Pt will specifically work on: continue journaling, increase exercise routine for next visit.    Recommended Physical Activity Goals [name] has been advised to work up to 150 minutes of moderate intensity aerobic activity a week and strengthening exercises 2-3 times per week for cardiovascular health, weight loss maintenance and preservation of muscle mass.  She has agreed to increase physical activity in their day and reduce sedentary time (increase NEAT).   Pharmacotherapy We discussed various medication options to help [name] with her weight loss efforts and we both agreed to continue Ozempic '2mg'$  weekly.   FOLLOW UP: Return in about 4 weeks (around 06/28/2022).Marland Kitchen She was informed of the importance of frequent follow up visits to maximize her success with intensive lifestyle modifications for her multiple health conditions.  Weight Summary and Biometrics   Weight Lost Since Last Visit: 3lb  No data recorded  Vitals Temp: 98.1 F (36.7 C) BP: 119/78 Pulse Rate: 61 SpO2: 96 %   Anthropometric Measurements Height: '5\' 1"'$  (1.549 m)  Weight: 265 lb 6.4 oz (120.4 kg) BMI (Calculated): 50.17 Weight at Last Visit: 268lb Weight Lost Since Last Visit: 3lb Starting Weight: 266lb Total Weight Loss (lbs): 3 lb (1.361 kg) Peak Weight: 279lb   Body Composition  Body Fat %: 54.6 % Fat Mass (lbs): 145 lbs Muscle Mass (lbs): 114.4 lbs Total Body Water (lbs): 89.8 lbs Visceral Fat Rating : 22   Other Clinical Data Fasting: yes Labs: no Today's Visit #: 41 Starting Date: 09/18/19    Subjective:   Chief complaint: Obesity [name] is here to discuss her progress with her obesity treatment plan. She is on the the Category 2 Plan and states she is following her eating plan approximately 90 % of the time. She states  she is exercising 40 minutes 5 days per week.  Interval History:  Since last office visit she has been tracking her calories more and eating out of the home less. She has been eating sweets less and substituting sugar with Splenda. She has been eating at least 100g of protein per day. She takes her journal and eating out guide handouts with her even when she goes out to eat. She denies any challenges with her meal plan.  She struggles with her exercise routine. She has increased her walking routine from 15 to 20 minutes per day in the mornings. She is exercising for 20 minutes in the afternoons with weights following silver sneakers plan.  She is checking her blood sugars daily with her max around 110-125. She states that her Lantus has been decreased by her PCP due to the improvement in her blood sugars. She has been compliant with Metformin '500mg'$  BID, Lantus, and Glipizide '10mg'$  daily.   Pharmacotherapy for weight loss: She is currently taking  Ozempic '2mg'$  weekly  for medical weight loss.  Denies side effects. She feels that this is controlling her appetite and cravings. She eats fruit when she does have sugar cravings.  Review of Systems:  Pertinent positives were addressed with patient today.  Objective:   PHYSICAL EXAM:  Blood pressure 119/78, pulse 61, temperature 98.1 F (36.7 C), height '5\' 1"'$  (1.549 m), weight 265 lb 6.4 oz (120.4 kg), SpO2 96 %. Body mass index is 50.15 kg/m.  General: Well Developed, well nourished, and in no acute distress.  HEENT: Normocephalic, atraumatic Skin: Warm and dry, cap RF less 2 sec, good turgor Chest:  Normal excursion, shape, no gross abn Respiratory: speaking in full sentences, no conversational dyspnea NeuroM-Sk: Ambulates w/o assistance, moves * 4 Psych: A and O *3, insight good, mood-full   DIAGNOSTIC DATA REVIEWED:  BMET    Component Value Date/Time   NA 144 05/03/2022 1018   K 4.2 05/03/2022 1018   CL 100 05/03/2022 1018   CO2  26 05/03/2022 1018   GLUCOSE 87 05/03/2022 1018   GLUCOSE 110 (H) 03/02/2015 1534   BUN 25 05/03/2022 1018   CREATININE 1.01 (H) 05/03/2022 1018   CALCIUM 10.2 05/03/2022 1018   GFRNONAA 66 10/27/2020 0000   GFRAA 77 10/27/2020 0000   Lab Results  Component Value Date   HGBA1C 7.0 (H) 05/03/2022   HGBA1C 9.1 06/27/2006   Lab Results  Component Value Date   INSULIN 23.8 05/03/2022   INSULIN 10.1 04/29/2021   Lab Results  Component Value Date   TSH 1.320 04/29/2021   CBC    Component Value Date/Time   WBC 6.4 10/27/2020 0000   WBC 6.7 03/02/2015 1534   RBC 5.17 (A) 10/27/2020 0000  HGB 13.9 10/27/2020 0000   HGB 15.3 09/18/2019 1455   HCT 44 10/27/2020 0000   HCT 47.5 (H) 09/18/2019 1455   PLT 283 10/27/2020 0000   PLT 312 09/18/2019 1455   MCV 85 09/18/2019 1455   MCH 27.4 09/18/2019 1455   MCH 27.0 03/02/2015 1534   MCHC 32.2 09/18/2019 1455   MCHC 31.3 03/02/2015 1534   RDW 13.5 09/18/2019 1455   Iron Studies No results found for: "IRON", "TIBC", "FERRITIN", "IRONPCTSAT" Lipid Panel     Component Value Date/Time   CHOL 145 05/03/2022 1018   TRIG 127 05/03/2022 1018   HDL 44 05/03/2022 1018   CHOLHDL 3.3 05/03/2022 1018   CHOLHDL 2.6 Ratio 06/27/2008 2132   VLDL 23 06/27/2008 2132   LDLCALC 78 05/03/2022 1018   LDLDIRECT 66 04/09/2009 2134   Hepatic Function Panel     Component Value Date/Time   PROT 7.9 05/03/2022 1018   ALBUMIN 4.4 05/03/2022 1018   AST 16 05/03/2022 1018   ALT 14 05/03/2022 1018   ALKPHOS 98 05/03/2022 1018   BILITOT 0.5 05/03/2022 1018      Component Value Date/Time   TSH 1.320 04/29/2021 0849   Nutritional Lab Results  Component Value Date   VD25OH 52.7 05/03/2022   VD25OH 64.9 08/26/2021   VD25OH 38.8 04/29/2021    Attestations:   Reviewed by clinician on day of visit: allergies, medications, problem list, medical history, surgical history, family history, social history, and previous encounter  notes.   I,Alexis Herring,acting as a Education administrator for [employer], DO.,have documented all relevant documentation on the behalf of Theresa Dance, DO,as directed by  Theresa Dance, DO while in the presence of Theresa Dance, DO.   I, Theresa Dance, DO, have reviewed all documentation for this visit. The documentation on 05/31/22 for the exam, diagnosis, procedures, and orders are all accurate and complete.

## 2022-06-22 DIAGNOSIS — G4733 Obstructive sleep apnea (adult) (pediatric): Secondary | ICD-10-CM | POA: Diagnosis not present

## 2022-06-28 ENCOUNTER — Encounter (INDEPENDENT_AMBULATORY_CARE_PROVIDER_SITE_OTHER): Payer: Self-pay | Admitting: Family Medicine

## 2022-06-28 ENCOUNTER — Ambulatory Visit (INDEPENDENT_AMBULATORY_CARE_PROVIDER_SITE_OTHER): Payer: Medicare HMO | Admitting: Family Medicine

## 2022-06-28 VITALS — BP 130/1 | HR 72 | Temp 98.4°F | Ht 61.0 in | Wt 265.0 lb

## 2022-06-28 DIAGNOSIS — Z6841 Body Mass Index (BMI) 40.0 and over, adult: Secondary | ICD-10-CM

## 2022-06-28 DIAGNOSIS — E559 Vitamin D deficiency, unspecified: Secondary | ICD-10-CM | POA: Diagnosis not present

## 2022-06-28 DIAGNOSIS — Z794 Long term (current) use of insulin: Secondary | ICD-10-CM | POA: Diagnosis not present

## 2022-06-28 DIAGNOSIS — Z7984 Long term (current) use of oral hypoglycemic drugs: Secondary | ICD-10-CM | POA: Diagnosis not present

## 2022-06-28 DIAGNOSIS — Z7985 Long-term (current) use of injectable non-insulin antidiabetic drugs: Secondary | ICD-10-CM | POA: Diagnosis not present

## 2022-06-28 DIAGNOSIS — E1169 Type 2 diabetes mellitus with other specified complication: Secondary | ICD-10-CM

## 2022-06-28 MED ORDER — ERGOCALCIFEROL 1.25 MG (50000 UT) PO CAPS: ORAL_CAPSULE | ORAL | 0 refills | Status: DC

## 2022-06-28 NOTE — Progress Notes (Signed)
[name], D.O.  ABFM, ABOM Specializing in Clinical Bariatric Medicine  Office located at: [address] [place], [place]  [address]     Assessment and Plan:   No orders of the defined types were placed in this encounter.   Medications Discontinued During This Encounter  Medication Reason   ergocalciferol (VITAMIN D2) 1.25 MG (50000 UT) capsule Reorder     Meds ordered this encounter  Medications   ergocalciferol (VITAMIN D2) 1.25 MG (50000 UT) capsule    Sig: 1 q Thursday and 1 q Sun    Dispense:  8 capsule    Refill:  0    30 d supply;  ** OV for RF **   Do not send RF request     Type 2 diabetes mellitus with other specified complication, with long-term current use of insulin Assessment: Condition is Not optimized. Lab Results  Component Value Date   HGBA1C 7.0 (H) 05/03/2022   HGBA1C 6.6 (H) 08/26/2021   HGBA1C 7.5 (H) 04/29/2021   INSULIN 23.8 05/03/2022   INSULIN 10.1 04/29/2021  She has been more compliant with Semaglutide 2 mg weekly, Metformin 500 mg daily, Lantus 46 units in the morning  and 26 units at unit, and Invokana 300 mg daily. Denies any side effects. She endorses that her hunger and cravings have been better controlled. She checks her blood sugars twice a day. The highest her blood sugars get in the morning is 121. The highest her blood sugars get at night is 140.  Plan: Continue with meds and checking blood sugars at home.  Continue her prudent nutritional plan and continue to advance exercise and cardiovascular fitness as tolerated.    Vitamin D Deficiency Assessment: Condition is stable. Lab Results  Component Value Date   VD25OH 52.7 05/03/2022   VD25OH 64.9 08/26/2021   VD25OH 38.8 04/29/2021  She has been compliant with Ergocalciferol 50K IU twice weekly. Denies any side effects.  Plan: Will refill this today. Continue with med. - weight loss will likely improve availability of vitamin D, thus encouraged Murlene to  continue with meal plan and their weight loss efforts to further improve this condition.  Thus, we will need to monitor levels regularly (every 3-4 mo on average) to keep levels within normal limits and prevent over supplementation.   TREATMENT PLAN FOR OBESITY: BMI 50.0-59.9, adult (HCC)-current bmi 50.1 Morbid obesity (HCC)-start bmi 50.26/date 09/18/19  Assessment: Condition is Improving, but not optimized.. Biometric data collected today, was reviewed with patient.  Fat mass has decreased by 7.2lb. Muscle mass has increased by 6.4lb. Total body water has increased by 5lb.   Plan: Continue with  Category 2 meal plan and Journaling.  I reviewed her food journaling log with her and encouraged her to continue tracking even if she eats off plan.   Behavioral Intervention Additional resources provided today: patient declined Evidence-based interventions for health behavior change were utilized today including the discussion of self monitoring techniques, problem-solving barriers and SMART goal setting techniques.   Regarding patient's less desirable eating habits and patterns, we employed the technique of small changes.  Pt will specifically work on: continue with Category 2 meal plan and Journaling for next visit.    Recommended Physical Activity Goals Lether has been advised to work up to 150 minutes of moderate intensity aerobic activity a week and strengthening exercises 2-3 times per week for cardiovascular health, weight loss maintenance and preservation of muscle mass.  She has agreed to [employer]  current level of physical activity   FOLLOW UP: No follow-ups on file. She was informed of the importance of frequent follow up visits to maximize her success with intensive lifestyle modifications for her multiple health conditions.  Subjective:   Chief complaint: Obesity Estelene is here to discuss her progress with her obesity treatment plan. She is on the the Category 2 Plan + Journaling  and states she is following her eating plan approximately 90% of the time. She states she is exercising 30 minutes 7 days per week.  Interval History:  Keiyana A Kleinberg is here for a follow up office visit. She endorses that her journaling has been helping her with more mindful eating. She eats out 5x a week (fast food and at friends home)   We reviewed her meal plan and all questions were answered. Patient's food recall appears to be accurate and consistent with what is on plan when she is following it. When eating on plan, her hunger and cravings are well controlled.      Pharmacotherapy for weight loss: She is currently taking  Metformin and Semaglutide  for medical weight loss.  Denies side effects.    Review of Systems:  Pertinent positives were addressed with patient today.  Weight Summary and Biometrics   Weight Lost Since Last Visit: 0  Weight Gained Since Last Visit: 0    Vitals Temp: 98.4 F (36.9 C) BP: (!) 130/1 Pulse Rate: 72 SpO2: 97 %   Anthropometric Measurements Height: 5\' 1"  (1.549 m) Weight: 265 lb (120.2 kg) BMI (Calculated): 50.1 Weight at Last Visit: 265lb Weight Lost Since Last Visit: 0 Weight Gained Since Last Visit: 0 Starting Weight: 266lb Total Weight Loss (lbs): 3 lb (1.361 kg) Peak Weight: 279lb   Body Composition  Body Fat %: 52 % Fat Mass (lbs): 137.8 lbs Muscle Mass (lbs): 120.8 lbs Total Body Water (lbs): 94.8 lbs Visceral Fat Rating : 21   Other Clinical Data Fasting: yes Labs: no Today's Visit #: 44 Starting Date: 09/18/19    Objective:   PHYSICAL EXAM:  Blood pressure (!) 130/1, pulse 72, temperature 98.4 F (36.9 C), height 5\' 1"  (1.549 m), weight 265 lb (120.2 kg), SpO2 97 %. Body mass index is 50.07 kg/m.  General: Well Developed, well nourished, and in no acute distress.  HEENT: Normocephalic, atraumatic Skin: Warm and dry, cap RF less 2 sec, good turgor Chest:  Normal excursion, shape, no gross  abn Respiratory: speaking in full sentences, no conversational dyspnea NeuroM-Sk: Ambulates w/o assistance, moves * 4 Psych: A and O *3, insight good, mood-full  DIAGNOSTIC DATA REVIEWED:  BMET    Component Value Date/Time   NA 144 05/03/2022 1018   K 4.2 05/03/2022 1018   CL 100 05/03/2022 1018   CO2 26 05/03/2022 1018   GLUCOSE 87 05/03/2022 1018   GLUCOSE 110 (H) 03/02/2015 1534   BUN 25 05/03/2022 1018   CREATININE 1.01 (H) 05/03/2022 1018   CALCIUM 10.2 05/03/2022 1018   GFRNONAA 66 10/27/2020 0000   GFRAA 77 10/27/2020 0000   Lab Results  Component Value Date   HGBA1C 7.0 (H) 05/03/2022   HGBA1C 9.1 06/27/2006   Lab Results  Component Value Date   INSULIN 23.8 05/03/2022   INSULIN 10.1 04/29/2021   Lab Results  Component Value Date   TSH 1.320 04/29/2021   CBC    Component Value Date/Time   WBC 6.4 10/27/2020 0000   WBC 6.7 03/02/2015 1534   RBC 5.17 (A) 10/27/2020  0000   HGB 13.9 10/27/2020 0000   HGB 15.3 09/18/2019 1455   HCT 44 10/27/2020 0000   HCT 47.5 (H) 09/18/2019 1455   PLT 283 10/27/2020 0000   PLT 312 09/18/2019 1455   MCV 85 09/18/2019 1455   MCH 27.4 09/18/2019 1455   MCH 27.0 03/02/2015 1534   MCHC 32.2 09/18/2019 1455   MCHC 31.3 03/02/2015 1534   RDW 13.5 09/18/2019 1455   Iron Studies No results found for: "IRON", "TIBC", "FERRITIN", "IRONPCTSAT" Lipid Panel     Component Value Date/Time   CHOL 145 05/03/2022 1018   TRIG 127 05/03/2022 1018   HDL 44 05/03/2022 1018   CHOLHDL 3.3 05/03/2022 1018   CHOLHDL 2.6 Ratio 06/27/2008 2132   VLDL 23 06/27/2008 2132   LDLCALC 78 05/03/2022 1018   LDLDIRECT 66 04/09/2009 2134   Hepatic Function Panel     Component Value Date/Time   PROT 7.9 05/03/2022 1018   ALBUMIN 4.4 05/03/2022 1018   AST 16 05/03/2022 1018   ALT 14 05/03/2022 1018   ALKPHOS 98 05/03/2022 1018   BILITOT 0.5 05/03/2022 1018      Component Value Date/Time   TSH 1.320 04/29/2021 0849   Nutritional Lab  Results  Component Value Date   VD25OH 52.7 05/03/2022   VD25OH 64.9 08/26/2021   VD25OH 38.8 04/29/2021    Attestations:   Reviewed by clinician on day of visit: allergies, medications, problem list, medical history, surgical history, family history, social history, and previous encounter notes.    I,Special Puri,acting as a Neurosurgeonscribe for Marsh & McLennanDeborah Amyri Frenz, DO.,have documented all relevant documentation on the behalf of Thomasene LotDeborah Sophia Sperry, DO,as directed by  Thomasene Loteborah Viona Hosking, DO while in the presence of Thomasene Loteborah Humbert Morozov, DO.   I, Thomasene Loteborah Delores Thelen, DO, have reviewed all documentation for this visit. The documentation on 06/28/22 for the exam, diagnosis, procedures, and orders are all accurate and complete.

## 2022-07-09 DIAGNOSIS — I10 Essential (primary) hypertension: Secondary | ICD-10-CM | POA: Diagnosis not present

## 2022-07-09 DIAGNOSIS — E1142 Type 2 diabetes mellitus with diabetic polyneuropathy: Secondary | ICD-10-CM | POA: Diagnosis not present

## 2022-07-09 DIAGNOSIS — L304 Erythema intertrigo: Secondary | ICD-10-CM | POA: Diagnosis not present

## 2022-07-09 DIAGNOSIS — Z794 Long term (current) use of insulin: Secondary | ICD-10-CM | POA: Diagnosis not present

## 2022-07-09 DIAGNOSIS — Z6841 Body Mass Index (BMI) 40.0 and over, adult: Secondary | ICD-10-CM | POA: Diagnosis not present

## 2022-07-09 DIAGNOSIS — E7800 Pure hypercholesterolemia, unspecified: Secondary | ICD-10-CM | POA: Diagnosis not present

## 2022-07-19 ENCOUNTER — Encounter (INDEPENDENT_AMBULATORY_CARE_PROVIDER_SITE_OTHER): Payer: Self-pay | Admitting: Family Medicine

## 2022-07-19 ENCOUNTER — Ambulatory Visit (INDEPENDENT_AMBULATORY_CARE_PROVIDER_SITE_OTHER): Payer: Medicare HMO | Admitting: Family Medicine

## 2022-07-19 VITALS — BP 110/74 | HR 74 | Temp 98.2°F | Ht 61.0 in | Wt 261.0 lb

## 2022-07-19 DIAGNOSIS — Z7984 Long term (current) use of oral hypoglycemic drugs: Secondary | ICD-10-CM | POA: Diagnosis not present

## 2022-07-19 DIAGNOSIS — E559 Vitamin D deficiency, unspecified: Secondary | ICD-10-CM | POA: Diagnosis not present

## 2022-07-19 DIAGNOSIS — E1169 Type 2 diabetes mellitus with other specified complication: Secondary | ICD-10-CM

## 2022-07-19 DIAGNOSIS — Z794 Long term (current) use of insulin: Secondary | ICD-10-CM | POA: Diagnosis not present

## 2022-07-19 DIAGNOSIS — Z6841 Body Mass Index (BMI) 40.0 and over, adult: Secondary | ICD-10-CM | POA: Diagnosis not present

## 2022-07-19 MED ORDER — ERGOCALCIFEROL 1.25 MG (50000 UT) PO CAPS: ORAL_CAPSULE | ORAL | 0 refills | Status: DC

## 2022-07-19 NOTE — Progress Notes (Signed)
[name], D.O.  ABFM, ABOM Specializing in Clinical Bariatric Medicine  Office located at: [address] [place], [place]  [address]     Assessment and Plan:   Medications Discontinued During This Encounter  Medication Reason   ergocalciferol (VITAMIN D2) 1.25 MG (50000 UT) capsule Reorder     Meds ordered this encounter  Medications   ergocalciferol (VITAMIN D2) 1.25 MG (50000 UT) capsule    Sig: 1 q Thursday and 1 q Sun    Dispense:  8 capsule    Refill:  0    30 d supply;  ** OV for RF **   Do not send RF request     Type 2 diabetes mellitus with other specified complication, with long-term current use of insulin (HCC)  Assessment: Condition is Not at goal.. Labs were reviewed.  Lab Results  Component Value Date   HGBA1C 7.0 (H) 05/03/2022   HGBA1C 6.6 (H) 08/26/2021   HGBA1C 7.5 (H) 04/29/2021   INSULIN 23.8 05/03/2022   INSULIN 10.1 04/29/2021   She endorses that her hunger and cravings have been better controlled. Her blood sugar has been good and her PCP has reduced to reduce Lantus to 20 mg at night and 40 mg in the morning. Her PCP has also increased Metformin to 1000 mg in the morning and the evening. She reports compliance with Semaglutide 2 mg weekly. She reports compliance and good tolerance of Invokana 300 mg and glipizide 10 mg  Plan:Continue medications per the recommendation of her PCP.  -Continue with meds and checking blood sugars at home.  -Continue her prudent nutritional plan and continue to advance exercise and cardiovascular fitness as tolerated.   - Hypoglycemia prevention discussed with the patient.  Eat on a regular basis- no skipping or going long periods without eating.     - Recommend that any concerns about medicines should be directed at the prescribing provider - Recheck labs in 3 months if not done at Endo provider / PCP.  - Importance of f/up with PCP and all other specialists, as scheduled, was stressed to the patient  today    Vitamin D deficiency Assessment: Condition is Not at goal.. Labs were reviewed.  Lab Results  Component Value Date   VD25OH 52.7 05/03/2022   VD25OH 64.9 08/26/2021   VD25OH 38.8 04/29/2021  She has been compliant with Ergocalciferol 50K IU twice weekly. Denies any side effects.   Plan:Continue with meds. Will refill this today.  - I discussed the importance of vitamin D to the patient's health and well-being as well as to their ability to lose weight.  - Informed patient this may be a lifelong thing, and she was encouraged to continue to take the medicine until told otherwise.    - weight loss will likely improve availability of vitamin D, thus encouraged [name] to continue with meal plan and their weight loss efforts to further improve this condition.  Thus, we will need to monitor levels regularly (every 3-4 mo on average) to keep levels within normal limits and prevent over supplementation. - pt's questions and concerns regarding this condition addressed.     TREATMENT PLAN FOR OBESITY: Morbid obesity (HCC)-start bmi 50.26/date 09/18/19 Obesity, Current BMI 51.5 Assessment: Condition is improving. Biometric data collected today, was reviewed with patient.  Fat mass has decreased by 4.2lb. Muscle mass has decreased by 0.4lb. Total body water has increased by 1.6lb.  She states that her hunger and cravings have been well controlled.  She also states that she eats plenty of fruits.   Plan:  [name] is currently in the action stage of change. As such, her goal is to continue weight management plan. [name] will work on healthier eating habits and try their best to follow the Continue Category 4 meal plan and journaling  best they can.   Behavioral Intervention Additional resources provided today: category 2 meal plan information Evidence-based interventions for health behavior change were utilized today including the discussion of self monitoring techniques, problem-solving  barriers and SMART goal setting techniques.   Regarding patient's less desirable eating habits and patterns, we employed the technique of small changes.  Pt will specifically work on: lose at least 1 pound of fat each visit and increase protein intake for next visit.    Recommended Physical Activity Goals [name] has been advised to work up to 150 minutes of moderate intensity aerobic activity a week and strengthening exercises 2-3 times per week for cardiovascular health, weight loss maintenance and preservation of muscle mass.  She has agreed to Think about ways to increase physical activity  FOLLOW UP: Return in about 3 weeks (around 08/09/2022). She was informed of the importance of frequent follow up visits to maximize her success with intensive lifestyle modifications for her multiple health conditions.   Subjective:   Chief complaint: Obesity [name] is here to discuss her progress with her obesity treatment plan. She is on the the Category 2 Plan and journaling and states she is following her eating plan approximately 85-90% of the time. She states she is exercising 60 minutes 4 days per week.  Interval History:  [name] is here for a follow up office visit. Since last office visit she states the her meal plan has been good and she has been following it since getting her handouts from last visit. She denies any food journaling as she feels as if she doesn't need it. She's been going to her exercises regularly. She states that she typically eats out twice a week and eats plenty of fruits. She reports that she eats a piece of fruit with breakfast and dinner. She states that she has been feeling better since losing some weight and her knee pain has improved. She states the she's been having 4 bottles of water a day.   We reviewed her meal plan and all questions were answered. Patient's food recall appears to be accurate and consistent with what is on plan when she is following it.  When eating on plan, her hunger and cravings are well controlled.      Pharmacotherapy for weight loss: She is currently taking  Semaglutide  for medical weight loss.  Denies side effects.    Review of Systems:  Pertinent positives were addressed with patient today.   Weight Summary and Biometrics   Weight Lost Since Last Visit: 4 lb  No data recorded   Vitals Temp: 98.2 F (36.8 C) BP: 110/74 Pulse Rate: 74 SpO2: 97 %   Anthropometric Measurements Height: 5\' 1"  (1.549 m) Weight: 261 lb (118.4 kg) BMI (Calculated): 49.34 Weight at Last Visit: 265 lb Weight Lost Since Last Visit: 4 lb Starting Weight: 266 lb Total Weight Loss (lbs): 7 lb (3.175 kg) Peak Weight: 279 lb   Body Composition  Body Fat %: 54.2 % Fat Mass (lbs): 141.8 lbs Muscle Mass (lbs): 114 lbs Total Body Water (lbs): 91.6 lbs   Other Clinical Data Fasting: No Labs: No Today's Visit #: 45 Starting Date: 09/18/19  Objective:   PHYSICAL EXAM: Blood pressure 110/74, pulse 74, temperature 98.2 F (36.8 C), height 5\' 1"  (1.549 m), weight 261 lb (118.4 kg), SpO2 97 %. Body mass index is 49.32 kg/m.  General: Well Developed, well nourished, and in no acute distress.  HEENT: Normocephalic, atraumatic Skin: Warm and dry, cap RF less 2 sec, good turgor Chest:  Normal excursion, shape, no gross abn Respiratory: speaking in full sentences, no conversational dyspnea NeuroM-Sk: Ambulates w/o assistance, moves * 4 Psych: A and O *3, insight good, mood-full  DIAGNOSTIC DATA REVIEWED:  BMET    Component Value Date/Time   NA 144 05/03/2022 1018   K 4.2 05/03/2022 1018   CL 100 05/03/2022 1018   CO2 26 05/03/2022 1018   GLUCOSE 87 05/03/2022 1018   GLUCOSE 110 (H) 03/02/2015 1534   BUN 25 05/03/2022 1018   CREATININE 1.01 (H) 05/03/2022 1018   CALCIUM 10.2 05/03/2022 1018   GFRNONAA 66 10/27/2020 0000   GFRAA 77 10/27/2020 0000   Lab Results  Component Value Date   HGBA1C 7.0 (H)  05/03/2022   HGBA1C 9.1 06/27/2006   Lab Results  Component Value Date   INSULIN 23.8 05/03/2022   INSULIN 10.1 04/29/2021   Lab Results  Component Value Date   TSH 1.320 04/29/2021   CBC    Component Value Date/Time   WBC 6.4 10/27/2020 0000   WBC 6.7 03/02/2015 1534   RBC 5.17 (A) 10/27/2020 0000   HGB 13.9 10/27/2020 0000   HGB 15.3 09/18/2019 1455   HCT 44 10/27/2020 0000   HCT 47.5 (H) 09/18/2019 1455   PLT 283 10/27/2020 0000   PLT 312 09/18/2019 1455   MCV 85 09/18/2019 1455   MCH 27.4 09/18/2019 1455   MCH 27.0 03/02/2015 1534   MCHC 32.2 09/18/2019 1455   MCHC 31.3 03/02/2015 1534   RDW 13.5 09/18/2019 1455   Iron Studies No results found for: "IRON", "TIBC", "FERRITIN", "IRONPCTSAT" Lipid Panel     Component Value Date/Time   CHOL 145 05/03/2022 1018   TRIG 127 05/03/2022 1018   HDL 44 05/03/2022 1018   CHOLHDL 3.3 05/03/2022 1018   CHOLHDL 2.6 Ratio 06/27/2008 2132   VLDL 23 06/27/2008 2132   LDLCALC 78 05/03/2022 1018   LDLDIRECT 66 04/09/2009 2134   Hepatic Function Panel     Component Value Date/Time   PROT 7.9 05/03/2022 1018   ALBUMIN 4.4 05/03/2022 1018   AST 16 05/03/2022 1018   ALT 14 05/03/2022 1018   ALKPHOS 98 05/03/2022 1018   BILITOT 0.5 05/03/2022 1018      Component Value Date/Time   TSH 1.320 04/29/2021 0849   Nutritional Lab Results  Component Value Date   VD25OH 52.7 05/03/2022   VD25OH 64.9 08/26/2021   VD25OH 38.8 04/29/2021    Attestations:   Reviewed by clinician on day of visit: allergies, medications, problem list, medical history, surgical history, family history, social history, and previous encounter notes.    I,Safa M Kadhim,acting as a scribe for [employer], DO.,have documented all relevant documentation on the behalf of Theresa Lot, DO,as directed by  Theresa Lot, DO while in the presence of Theresa Lot, DO.   I, Theresa Lot, DO, have reviewed all documentation for this visit. The  documentation on 07/19/22 for the exam, diagnosis, procedures, and orders are all accurate and complete.

## 2022-08-09 ENCOUNTER — Other Ambulatory Visit (INDEPENDENT_AMBULATORY_CARE_PROVIDER_SITE_OTHER): Payer: Self-pay | Admitting: Family Medicine

## 2022-08-09 ENCOUNTER — Ambulatory Visit (INDEPENDENT_AMBULATORY_CARE_PROVIDER_SITE_OTHER): Payer: Medicare HMO | Admitting: Family Medicine

## 2022-08-09 ENCOUNTER — Encounter (INDEPENDENT_AMBULATORY_CARE_PROVIDER_SITE_OTHER): Payer: Self-pay | Admitting: Family Medicine

## 2022-08-09 VITALS — BP 112/64 | HR 64 | Temp 98.2°F | Ht 61.0 in | Wt 265.0 lb

## 2022-08-09 DIAGNOSIS — Z7984 Long term (current) use of oral hypoglycemic drugs: Secondary | ICD-10-CM

## 2022-08-09 DIAGNOSIS — Z794 Long term (current) use of insulin: Secondary | ICD-10-CM | POA: Diagnosis not present

## 2022-08-09 DIAGNOSIS — E785 Hyperlipidemia, unspecified: Secondary | ICD-10-CM

## 2022-08-09 DIAGNOSIS — Z7985 Long-term (current) use of injectable non-insulin antidiabetic drugs: Secondary | ICD-10-CM

## 2022-08-09 DIAGNOSIS — E1169 Type 2 diabetes mellitus with other specified complication: Secondary | ICD-10-CM | POA: Diagnosis not present

## 2022-08-09 DIAGNOSIS — E559 Vitamin D deficiency, unspecified: Secondary | ICD-10-CM | POA: Diagnosis not present

## 2022-08-09 DIAGNOSIS — Z6841 Body Mass Index (BMI) 40.0 and over, adult: Secondary | ICD-10-CM | POA: Diagnosis not present

## 2022-08-09 MED ORDER — SEMAGLUTIDE (2 MG/DOSE) 8 MG/3ML ~~LOC~~ SOPN: 2.0000 mg | PEN_INJECTOR | SUBCUTANEOUS | 0 refills | Status: DC

## 2022-08-09 MED ORDER — ERGOCALCIFEROL 1.25 MG (50000 UT) PO CAPS: ORAL_CAPSULE | ORAL | 0 refills | Status: DC

## 2022-08-09 MED ORDER — METFORMIN HCL 500 MG PO TABS: ORAL_TABLET | ORAL | 0 refills | Status: DC

## 2022-08-09 NOTE — Progress Notes (Signed)
[name], D.O.  ABFM, ABOM Specializing in Clinical Bariatric Medicine  Office located at: [address] [place], [place]  [address]     Assessment and Plan:   Orders Placed This Encounter  Procedures   Hemoglobin A1c   Basic metabolic panel   VITAMIN D 25 Hydroxy (Vit-D Deficiency, Fractures)   Medications Discontinued During This Encounter  Medication Reason   glipiZIDE (GLUCOTROL) 10 MG tablet Side effect (s)   metFORMIN (GLUCOPHAGE) 500 MG tablet Reorder   Semaglutide, 2 MG/DOSE, 8 MG/3ML SOPN Reorder   ergocalciferol (VITAMIN D2) 1.25 MG (50000 UT) capsule Reorder     Meds ordered this encounter  Medications   ergocalciferol (VITAMIN D2) 1.25 MG (50000 UT) capsule    Sig: 1 q Thursday and 1 q Sun    Dispense:  8 capsule    Refill:  0    30 d supply;  ** OV for RF **   Do not send RF request   Semaglutide, 2 MG/DOSE, 8 MG/3ML SOPN    Sig: Inject 2 mg as directed once a week.    Dispense:  9 mL    Refill:  0    90 d supply;  ** OV for RF **   Do not send RF request   metFORMIN (GLUCOPHAGE) 500 MG tablet    Sig: TAKE 1 TABLET TWICE DAILY WITH MEALS    Dispense:  180 tablet    Refill:  0     Hyperlipidemia associated with type 2 diabetes mellitus (HCC) Assessment: Condition is not optimized.  Lab Results  Component Value Date   CHOL 145 05/03/2022   HDL 44 05/03/2022   LDLCALC 78 05/03/2022   LDLDIRECT 66 04/09/2009   TRIG 127 05/03/2022   CHOLHDL 3.3 05/03/2022  - No issues with Lipitor 40 mg daily, compliance good. Denies any side effects.  Plan:  - Salley Scarlet agrees to continue with med and our treatment plan of a heart-heathy, low cholesterol meal plan - We will continue routine screening as patient continues to achieve health goals along their weight loss journey      Vitamin D deficiency Assessment: Condition is stable. Lab Results  Component Value Date   VD25OH 52.7 05/03/2022   VD25OH 64.9 08/26/2021   VD25OH 38.8  04/29/2021  - Reports good compliance and tolerance with Vitamin D2 50,000 IU twice weekly. Denies any side effects.  Plan: - Recheck labs.  - Continue with med. Will refill this today.      Type 2 diabetes mellitus with other specified complication, with long-term current use of insulin (HCC) Assessment: Condition is not optimized. Lab Results  Component Value Date   HGBA1C 7.0 (H) 05/03/2022   HGBA1C 6.6 (H) 08/26/2021   HGBA1C 7.5 (H) 04/29/2021   INSULIN 23.8 05/03/2022   INSULIN 10.1 04/29/2021  - Patient endorses that her blood sugars have been running low in the mornings before breakfast. She reports that it was 52 on one occasion and typically runs in the 80s-90s. Her low blood sugars are likely due to the Glipizide.  - In the evenings, she endorses that her blood sugars are stable.  - For the last 2-3 days, pt endorses that she has been taking Metformin 1000 mg, which was given to her by her [facility] PCP. There is no evidence of this in the medical record.  - Pt states that her PCP also recently halfed the dose of her night time Lantus. - She has also  been compliant with Invokana 300 mg daily.  - Furth more, pt endorses feeling nauseated recently, which is likely due to her eating higher carb foods (I.e grilled cheese sandwiches)    Plan: - Recheck labs.  - Discontinue Glipizide.  Will refill Semaglutide and Metformin today, take 1 tablet twice a day of Metformin with meals.  Continue all other diabetic medications as recommended by PCP.  - Continue her prudent nutritional plan that is low in simple carbohydrates, saturated fats and trans fats to goal of 5-10% weight loss to achieve significant health benefits.  Pt encouraged to continually advance exercise and cardiovascular fitness as tolerated throughout weight loss journey.    TREATMENT PLAN FOR OBESITY: Obesity, Current BMI 50.1 Morbid obesity (HCC)-start bmi 50.26/date 09/18/19 Assessment:  [name] is here to  discuss her progress with her obesity treatment plan along with follow-up of her obesity related diagnoses. See Medical Weight Management Flowsheet for complete bioelectrical impedance results.  Condition is improving, but not optimized.   Since last office visit on 07/19/22 patient's Fat mass has decreased by 24.8 lb. Muscle mass has increased by 27 lb. Total body water has increased by 14 lb.  Counseling done on how various foods will affect these numbers and how to maximize success  Total lbs lost to date: -1  Total weight loss percentage to date: 0.38   Plan:  [name] is currently in the action stage of change. As such, her goal is to continue her weight management plan. [name] will work on healthier eating habits and continue the Category 4 meal plan with Journaling.   Behavioral Intervention Additional resources provided today: category 3 meal plan information Evidence-based interventions for health behavior change were utilized today including the discussion of self monitoring techniques, problem-solving barriers and SMART goal setting techniques.   Regarding patient's less desirable eating habits and patterns, we employed the technique of small changes.  Pt will specifically work on: continue adherence to prudent nutritional plan for next visit.    Recommended Physical Activity Goals  [name] has been advised to slowly work up to 150 minutes of moderate intensity aerobic activity a week and strengthening exercises 2-3 times per week for cardiovascular health, weight loss maintenance and preservation of muscle mass.   She has agreed to Continue current level of physical activity   FOLLOW UP: Return in about 12 weeks (around 11/01/2022). She was informed of the importance of frequent follow up visits to maximize her success with intensive lifestyle modifications for her multiple health conditions.  [name] is aware that we will review all of her lab results at our next visit.   She is aware that if anything is critical/ life threatening with the results, we will be contacting her via MyChart prior to the office visit to discuss management.   Subjective:   Chief complaint: Obesity [name] is here to discuss her progress with her obesity treatment plan. She is on the the Category 4 Plan + Journaling and states she is following her eating plan approximately 85% of the time. She states she is exercising (walking and silver-sneakers) 60 minutes 3 days per week.  Interval History:  Brenlyn A Machak is here for a follow up office visit.     Since last office visit:   - She did not journal consistently. - Has not been able to walk as much as she would like to because of her knee pain. - Endorses she has not been eating out lately and is  not skipping meals.  - Patient endorses being very busy  in her personal life.  - We will see patient back in 90 days and assess her readiness for change at that time.    Pharmacotherapy for weight loss: She is currently taking  Metformin and Semaglutide  for medical weight loss.  Denies side effects.    Review of Systems:  Pertinent positives were addressed with patient today.  Weight Summary and Biometrics   Weight Lost Since Last Visit: 0  Weight Gained Since Last Visit: 4 lb    Vitals Temp: 98.2 F (36.8 C) BP: 112/64 Pulse Rate: 64 SpO2: 95 %   Anthropometric Measurements Height: 5\' 1"  (1.549 m) Weight: 265 lb (120.2 kg) BMI (Calculated): 50.1 Weight at Last Visit: 261 lb Weight Lost Since Last Visit: 0 Weight Gained Since Last Visit: 4 lb Starting Weight: 266 lb Total Weight Loss (lbs): 3 lb (1.361 kg) Peak Weight: 279 lb   Body Composition  Body Fat %: 44.1 % Fat Mass (lbs): 117 lbs Muscle Mass (lbs): 141 lbs Total Body Water (lbs): 105.6 lbs Visceral Fat Rating : 18   Other Clinical Data Fasting: No Labs: No Today's Visit #: 46 Starting Date: 09/18/19   Objective:   PHYSICAL EXAM: Blood  pressure 112/64, pulse 64, temperature 98.2 F (36.8 C), height 5\' 1"  (1.549 m), weight 265 lb (120.2 kg), SpO2 95 %. Body mass index is 50.07 kg/m.  General: Well Developed, well nourished, and in no acute distress.  HEENT: Normocephalic, atraumatic Skin: Warm and dry, cap RF less 2 sec, good turgor Chest:  Normal excursion, shape, no gross abn Respiratory: speaking in full sentences, no conversational dyspnea NeuroM-Sk: Ambulates w/o assistance, moves * 4 Psych: A and O *3, insight good, mood-full  DIAGNOSTIC DATA REVIEWED:  BMET    Component Value Date/Time   NA 144 05/03/2022 1018   K 4.2 05/03/2022 1018   CL 100 05/03/2022 1018   CO2 26 05/03/2022 1018   GLUCOSE 87 05/03/2022 1018   GLUCOSE 110 (H) 03/02/2015 1534   BUN 25 05/03/2022 1018   CREATININE 1.01 (H) 05/03/2022 1018   CALCIUM 10.2 05/03/2022 1018   GFRNONAA 66 10/27/2020 0000   GFRAA 77 10/27/2020 0000   Lab Results  Component Value Date   HGBA1C 7.0 (H) 05/03/2022   HGBA1C 9.1 06/27/2006   Lab Results  Component Value Date   INSULIN 23.8 05/03/2022   INSULIN 10.1 04/29/2021   Lab Results  Component Value Date   TSH 1.320 04/29/2021   CBC    Component Value Date/Time   WBC 6.4 10/27/2020 0000   WBC 6.7 03/02/2015 1534   RBC 5.17 (A) 10/27/2020 0000   HGB 13.9 10/27/2020 0000   HGB 15.3 09/18/2019 1455   HCT 44 10/27/2020 0000   HCT 47.5 (H) 09/18/2019 1455   PLT 283 10/27/2020 0000   PLT 312 09/18/2019 1455   MCV 85 09/18/2019 1455   MCH 27.4 09/18/2019 1455   MCH 27.0 03/02/2015 1534   MCHC 32.2 09/18/2019 1455   MCHC 31.3 03/02/2015 1534   RDW 13.5 09/18/2019 1455   Iron Studies No results found for: "IRON", "TIBC", "FERRITIN", "IRONPCTSAT" Lipid Panel     Component Value Date/Time   CHOL 145 05/03/2022 1018   TRIG 127 05/03/2022 1018   HDL 44 05/03/2022 1018   CHOLHDL 3.3 05/03/2022 1018   CHOLHDL 2.6 Ratio 06/27/2008 2132   VLDL 23 06/27/2008 2132   LDLCALC 78 05/03/2022  1018  LDLDIRECT 66 04/09/2009 2134   Hepatic Function Panel     Component Value Date/Time   PROT 7.9 05/03/2022 1018   ALBUMIN 4.4 05/03/2022 1018   AST 16 05/03/2022 1018   ALT 14 05/03/2022 1018   ALKPHOS 98 05/03/2022 1018   BILITOT 0.5 05/03/2022 1018      Component Value Date/Time   TSH 1.320 04/29/2021 0849   Nutritional Lab Results  Component Value Date   VD25OH 52.7 05/03/2022   VD25OH 64.9 08/26/2021   VD25OH 38.8 04/29/2021    Attestations:   Reviewed by clinician on day of visit: allergies, medications, problem list, medical history, surgical history, family history, social history, and previous encounter notes.    I,Special Puri,acting as a Neurosurgeon for [employer], DO.,have documented all relevant documentation on the behalf of Thomasene Lot, DO,as directed by  Thomasene Lot, DO while in the presence of Thomasene Lot, DO.   I, Thomasene Lot, DO, have reviewed all documentation for this visit. The documentation on 08/09/22 for the exam, diagnosis, procedures, and orders are all accurate and complete.

## 2022-08-10 LAB — VITAMIN D 25 HYDROXY (VIT D DEFICIENCY, FRACTURES): Vit D, 25-Hydroxy: 51.3 ng/mL (ref 30.0–100.0)

## 2022-08-10 LAB — BASIC METABOLIC PANEL
BUN/Creatinine Ratio: 22 (ref 12–28)
BUN: 21 mg/dL (ref 8–27)
CO2: 27 mmol/L (ref 20–29)
Calcium: 10.1 mg/dL (ref 8.7–10.3)
Chloride: 101 mmol/L (ref 96–106)
Creatinine, Ser: 0.96 mg/dL (ref 0.57–1.00)
Glucose: 44 mg/dL — ABNORMAL LOW (ref 70–99)
Potassium: 4.2 mmol/L (ref 3.5–5.2)
Sodium: 140 mmol/L (ref 134–144)
eGFR: 66 mL/min/{1.73_m2} (ref >59)

## 2022-08-10 LAB — HEMOGLOBIN A1C
Est. average glucose Bld gHb Est-mCnc: 148 mg/dL
Hgb A1c MFr Bld: 6.8 % — ABNORMAL HIGH (ref 4.8–5.6)

## 2022-08-26 ENCOUNTER — Other Ambulatory Visit (INDEPENDENT_AMBULATORY_CARE_PROVIDER_SITE_OTHER): Payer: Medicare HMO

## 2022-08-26 ENCOUNTER — Ambulatory Visit: Payer: Medicare HMO | Admitting: Physician Assistant

## 2022-08-26 ENCOUNTER — Ambulatory Visit (INDEPENDENT_AMBULATORY_CARE_PROVIDER_SITE_OTHER): Payer: Medicare HMO | Admitting: Physician Assistant

## 2022-08-26 DIAGNOSIS — M25562 Pain in left knee: Secondary | ICD-10-CM | POA: Diagnosis not present

## 2022-08-26 MED ORDER — METHYLPREDNISOLONE ACETATE 40 MG/ML IJ SUSP
40.0000 mg | INTRAMUSCULAR | Status: AC | PRN
  Administered 2022-08-26: 40 mg via INTRA_ARTICULAR

## 2022-08-26 MED ORDER — LIDOCAINE HCL 1 % IJ SOLN
3.0000 mL | INTRAMUSCULAR | Status: AC | PRN
  Administered 2022-08-26: 3 mL

## 2022-08-26 NOTE — Progress Notes (Signed)
Office Visit Note   Patient: [name]           Date of Birth: 27-Dec-1960           MRN: [id number] Visit Date: 08/26/2022              Requested by: Thana Ates, MD [address] [place],  [place] [address] PCP: Thana Ates, MD  No chief complaint on file.     HPI: Patient is a 62 year old woman who is a patient of [name]'s.  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  She periodically gets steroid injections.  She comes in today requesting an injection into her left knee.  No injury.  Rates her pain as moderate  Assessment & Plan: Visit Diagnoses:  1. Acute pain of left knee     Plan: Will go forward with an injection into her left knee today.  She may follow-up with [name] as needed  Follow-Up Instructions: As needed with [name]  Ortho Exam  Patient is alert, oriented, no adenopathy, well-dressed, normal affect, normal respiratory effort. Examination of her left knee she has no redness no effusion no erythema she does have clinically varus alignment compartments are soft and compressible she does have tenderness and grinding with range of motion  Imaging: No results found. No images are attached to the encounter.  Labs: Lab Results  Component Value Date   HGBA1C 6.8 (H) 08/09/2022   HGBA1C 7.0 (H) 05/03/2022   HGBA1C 6.6 (H) 08/26/2021     Lab Results  Component Value Date   ALBUMIN 4.4 05/03/2022   ALBUMIN 4.2 04/29/2021   ALBUMIN 7.0 (A) 10/27/2020    Lab Results  Component Value Date   MG 2.3 09/18/2019   Lab Results  Component Value Date   VD25OH 51.3 08/09/2022   VD25OH 52.7 05/03/2022   VD25OH 64.9 08/26/2021    No results found for: "PREALBUMIN"    Latest Ref Rng & Units 10/27/2020   12:00 AM 11/29/2019   12:00 AM 09/18/2019    2:55 PM  CBC EXTENDED  WBC  6.4     6.5     6.2   RBC 3.87 - 5.11 5.17      5.58   Hemoglobin 12.0 - 16.0 13.9     14.5     15.3   HCT 36 - 46 44     47     47.5    Platelets 150 - 399 283     349     312   NEUT# 1.4 - 7.0 x10E3/uL   2.9   Lymph# 0.7 - 3.1 x10E3/uL   2.6      This result is from an external source.     There is no height or weight on file to calculate BMI.  Orders:  Orders Placed This Encounter  Procedures  . XR Knee 1-2 Views Left   No orders of the defined types were placed in this encounter.    Procedures: Large Joint Inj on 08/26/2022 9:29 AM Indications: pain and diagnostic evaluation Details: 25 G 1.5 in needle, anteromedial approach  Arthrogram: No  Medications: 40 mg methylPREDNISolone acetate 40 MG/ML; 3 mL lidocaine 1 % Outcome: tolerated well, no immediate complications Procedure, treatment alternatives, risks and benefits explained, specific risks discussed. Consent was given by the patient.    Clinical Data: No additional findings.  ROS:  All other systems negative, except as  noted in the HPI. Review of Systems  Objective: Vital Signs: There were no vitals taken for this visit.  Specialty Comments:  No specialty comments available.  PMFS History: Patient Active Problem List   Diagnosis Date Noted  . Morbid obesity (HCC)-start bmi 51.47 05/03/2022  . OSA (obstructive sleep apnea) 04/01/2022  . Type 2 diabetes mellitus with obesity (HCC) 08/26/2021  . At risk for heart disease 05/25/2021  . Benign neoplasm of colon   . Depression 06/02/2020  . Other chronic pain 05/15/2020  . SOB (shortness of breath) 04/05/2020  . Hyperlipidemia associated with type 2 diabetes mellitus (HCC) 02/04/2020  . Vitamin D deficiency 01/16/2020  . Encounter for screening colonoscopy 01/16/2020  . Major depressive disorder, recurrent episode, mild (HCC) 01/12/2020  . Hypertension associated with type 2 diabetes mellitus (HCC) 01/02/2020  . BMI 50.0-59.9, adult (HCC) 05/21/2019  . Chronic pain of left knee 05/21/2019  . Chronic pain of both knees 05/21/2019  . Unilateral primary osteoarthritis, left knee  05/21/2019  . Unilateral primary osteoarthritis, right knee 05/21/2019  . ACROCHORDON 11/12/2009  . PERIPHERAL EDEMA 08/01/2009  . Pain in joint, lower leg 07/07/2009  . SINUS TACHYCARDIA 04/09/2009  . BACK PAIN, LUMBOSACRAL, CHRONIC 06/17/2008  . SHOULDER PAIN, RIGHT, CHRONIC 05/21/2008  . PERIPHERAL NEUROPATHY, MILD 03/05/2008  . 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  . Diabetes mellitus (HCC) 05/19/2006  . HYPERCHOLESTEROLEMIA 05/19/2006  . OBESITY, NOS 05/19/2006  . DEPRESSIVE DISORDER, NOS 05/19/2006  . HYPERTENSION, BENIGN SYSTEMIC 05/19/2006  . RHINITIS, ALLERGIC 05/19/2006  . GASTROESOPHAGEAL REFLUX, NO ESOPHAGITIS 05/19/2006   Past Medical History:  Diagnosis Date  . Acid reflux   . Arthritis   . Back pain   . Depression   . Diabetes mellitus without complication (HCC)   . Edema, lower extremity   . Glaucoma   . High cholesterol   . Hypertension   . Joint pain   . Nerve damage   . Shortness of breath   . Vitamin D deficiency     Family History  Problem Relation Age of Onset  . Obesity Mother   . Eating disorder Mother   . Hypertension Father   . Hyperlipidemia Father   . Alcoholism Father     Past Surgical History:  Procedure Laterality Date  . btl    . CHOLECYSTECTOMY  2001  . COLONOSCOPY WITH PROPOFOL N/A 01/06/2021   Procedure: COLONOSCOPY WITH PROPOFOL;  Surgeon: Benancio Deeds, MD;  Location: [facility];  Service: Gastroenterology;  Laterality: N/A;  . POLYPECTOMY  01/06/2021   Procedure: POLYPECTOMY;  Surgeon: Benancio Deeds, MD;  Location: [facility];  Service: Gastroenterology;;   Social History   Occupational History  . Occupation: [employer]  Tobacco Use  . Smoking status: Former    Packs/day: 1    Types: Cigarettes    Quit date: 2001    Years since quitting: 23.4  . Smokeless tobacco: Never  Substance and Sexual Activity  . Alcohol use: No  . Drug use: No  . Sexual activity: Not on file

## 2022-09-27 ENCOUNTER — Other Ambulatory Visit (INDEPENDENT_AMBULATORY_CARE_PROVIDER_SITE_OTHER): Payer: Self-pay | Admitting: Family Medicine

## 2022-09-27 DIAGNOSIS — E559 Vitamin D deficiency, unspecified: Secondary | ICD-10-CM

## 2022-10-17 ENCOUNTER — Other Ambulatory Visit (INDEPENDENT_AMBULATORY_CARE_PROVIDER_SITE_OTHER): Payer: Self-pay | Admitting: Family Medicine

## 2022-10-17 DIAGNOSIS — E1169 Type 2 diabetes mellitus with other specified complication: Secondary | ICD-10-CM

## 2022-11-01 ENCOUNTER — Encounter (INDEPENDENT_AMBULATORY_CARE_PROVIDER_SITE_OTHER): Payer: Self-pay | Admitting: Family Medicine

## 2022-11-01 ENCOUNTER — Ambulatory Visit (INDEPENDENT_AMBULATORY_CARE_PROVIDER_SITE_OTHER): Payer: Medicare HMO | Admitting: Family Medicine

## 2022-11-01 VITALS — BP 127/83 | HR 76 | Temp 98.3°F | Ht 61.0 in | Wt 265.0 lb

## 2022-11-01 DIAGNOSIS — E559 Vitamin D deficiency, unspecified: Secondary | ICD-10-CM | POA: Diagnosis not present

## 2022-11-01 DIAGNOSIS — E1159 Type 2 diabetes mellitus with other circulatory complications: Secondary | ICD-10-CM

## 2022-11-01 DIAGNOSIS — Z7984 Long term (current) use of oral hypoglycemic drugs: Secondary | ICD-10-CM

## 2022-11-01 DIAGNOSIS — E1169 Type 2 diabetes mellitus with other specified complication: Secondary | ICD-10-CM

## 2022-11-01 DIAGNOSIS — Z6841 Body Mass Index (BMI) 40.0 and over, adult: Secondary | ICD-10-CM

## 2022-11-01 DIAGNOSIS — Z7985 Long-term (current) use of injectable non-insulin antidiabetic drugs: Secondary | ICD-10-CM | POA: Diagnosis not present

## 2022-11-01 DIAGNOSIS — I152 Hypertension secondary to endocrine disorders: Secondary | ICD-10-CM | POA: Diagnosis not present

## 2022-11-01 DIAGNOSIS — Z794 Long term (current) use of insulin: Secondary | ICD-10-CM

## 2022-11-01 MED ORDER — SEMAGLUTIDE (2 MG/DOSE) 8 MG/3ML ~~LOC~~ SOPN: 2.0000 mg | PEN_INJECTOR | SUBCUTANEOUS | 0 refills | Status: DC

## 2022-11-01 MED ORDER — ERGOCALCIFEROL 1.25 MG (50000 UT) PO CAPS: ORAL_CAPSULE | ORAL | 1 refills | Status: DC

## 2022-11-01 NOTE — Progress Notes (Signed)
[name], D.O.  ABFM, ABOM Specializing in Clinical Bariatric Medicine  Office located at: [address] [place], [place]  [address]     Assessment and Plan:   Medications Discontinued During This Encounter  Medication Reason   ergocalciferol (VITAMIN D2) 1.25 MG (50000 UT) capsule Reorder   Semaglutide, 2 MG/DOSE, 8 MG/3ML SOPN Reorder    Meds ordered this encounter  Medications   Semaglutide, 2 MG/DOSE, 8 MG/3ML SOPN    Sig: Inject 2 mg as directed once a week.    Dispense:  9 mL    Refill:  0    90 d supply;  ** OV for RF **   Do not send RF request   ergocalciferol (VITAMIN D2) 1.25 MG (50000 UT) capsule    Sig: 1 q Thursday and 1 q Sun    Dispense:  8 capsule    Refill:  1    30 d supply;  ** OV for RF **   Do not send RF request    Type 2 diabetes mellitus with other specified complication, with long-term current use of insulin (HCC) Assessment: Condition is Not at goal.. Although her levels are still elevated her A1c has improved from 7.0 to 6.8. She endorses her hunger and cravings are well controlled. Pt is tolerating Metformin and Semaglutide well and denies any GI upset and any adverse side effects. Her PCP decreased her metformin from 1000 twice daily to 500mg  twice daily. Pt has been taking Lantus 42 units in the mornings and 24 units in the afternoons. She continues and tolerates invokana and denies any adverse side effects.  Lab Results  Component Value Date   HGBA1C 6.8 (H) 08/09/2022   HGBA1C 7.0 (H) 05/03/2022   HGBA1C 6.6 (H) 08/26/2021   INSULIN 23.8 05/03/2022   INSULIN 10.1 04/29/2021    Plan: - Continue Metformin 500mg  twice daily and Semaglutide 2mg  weekly. I will refill this today.   - Continue Lantus and Invokana at current dose.    - Reminded [name] if she feels poorly- check Blood Sugar at that time.     - Recommend that any concerns about medicines should be directed at the prescribing provider.   - Recheck labs in 3  months if not done at Endo provider / PCP.   - Labs were reviewed with patient today and education provided on them. We discussed how the foods patient eats may influence these laboratory findings.  All of the patient's questions about them were answered.    Vitamin D deficiency Assessment: Condition is Controlled.. Pt vitamin D levels are well controlled with Ergocalciferol oral supplement weekly. She is tolerating this well and denies any adverse side effects.  Lab Results  Component Value Date   VD25OH 51.3 08/09/2022   VD25OH 52.7 05/03/2022   VD25OH 64.9 08/26/2021   Plan: - Continue with Ergocalciferol 50K IU weekly. I will refill this today.   - weight loss will likely improve availability of vitamin D, thus encouraged [name] to continue with meal plan and their weight loss efforts to further improve this condition.  Thus, we will need to monitor levels regularly (every 3-4 mo on average) to keep levels within normal limits and prevent over supplementation.  - Labs were reviewed with patient today and education provided on them. We discussed how the foods patient eats may influence these laboratory findings.  All of the patient's questions about them were answered    Hypertension associated with type  2 diabetes mellitus (HCC) Assessment: Condition is Controlled. Her HTN is well controlled with Metoprolol 50mg  twice daily, lsaix, and Accurectic. She denies any lightheadedness, dizziness, or headaches.  Last 3 blood pressure readings in our office are as follows: BP Readings from Last 3 Encounters:  11/01/22 127/83  08/09/22 112/64  07/19/22 110/74   Lab Results  Component Value Date   CREATININE 0.96 08/09/2022   BUN 21 08/09/2022   NA 140 08/09/2022   K 4.2 08/09/2022   CL 101 08/09/2022   CO2 27 08/09/2022    Plan: - Continue Metoprolol 50mg  twice daily, lasix 20mg  once daily, and Accurectic 20-25mg  once daily.    - BP is 127/83 at goal today.   - Ambulatory blood  pressure monitoring encouraged.  Reminded patient that if they ever feel poorly in any way, to check their blood pressure and pulse as well.  - We will continue to monitor symptoms closely alongside PCP/ specialists.  Pt reminded to also f/up with those individuals as instructed by them.   - Labs were reviewed with patient today and education provided on them. We discussed how the foods patient eats may influence these laboratory findings.  All of the patient's questions about them were answered    TREATMENT PLAN FOR OBESITY: Obesity, Current BMI 50.1 Morbid obesity (HCC)-start bmi 50.26/date 09/18/19 Assessment:  [name] is here to discuss her progress with her obesity treatment plan along with follow-up of her obesity related diagnoses. See Medical Weight Management Flowsheet for complete bioelectrical impedance results.  Condition is docourse: improving. Biometric data collected today, was reviewed with patient.   Since last office visit on 08/09/2022 patient's  Muscle mass has increased by 20.2lb. Fat mass has decreased by 21.2lb. Total body water has decreased by 1.6lb.  Counseling done on how various foods will affect these numbers and how to maximize success  Total lbs lost to date: 1lbs Total weight loss percentage to date: 0.38%   Plan: - Continue to adhere to the now Category 2 meal plan and journaling.    Behavioral Intervention Additional resources provided today: category 2 meal plan information Evidence-based interventions for health behavior change were utilized today including the discussion of self monitoring techniques, problem-solving barriers and SMART goal setting techniques.   Regarding patient's less desirable eating habits and patterns, we employed the technique of small changes.  Pt will specifically work on: Getting back to the basics on the meal plan for next visit.    Recommended Physical Activity Goals  [name] has been advised to slowly work up to  150 minutes of moderate intensity aerobic activity a week and strengthening exercises 2-3 times per week for cardiovascular health, weight loss maintenance and preservation of muscle mass.   She has agreed to Think about ways to increase daily physical activity and overcoming barriers to exercise   FOLLOW UP: Return in about 5 weeks (around 12/06/2022).  She was informed of the importance of frequent follow up visits to maximize her success with intensive lifestyle modifications for her multiple health conditions.  Subjective:   Chief complaint: Obesity Yan is here to discuss her progress with her obesity treatment plan. She is on the the Category 2 Plan and journaling and states she is following her eating plan approximately 20% of the time. She states she is walking 35 minutes 4 days per week.  Interval History:  [name] is here for a follow up office visit.   Since last OV she has been  doing well. I last seen her on 08/09/2022. Pt tends to drink ensure and glucerna for breakfast. She has been trying to eat lean meat with salads as she is not a "big eater". She notes not journaling and that her best strategy would be to cook and eat inside as she is not the best at counting her calories and protein when eating out. She notes going out to eat at least once a week. Pt informed me that she drinks about 4-5 small bottles of water daily.    Pharmacotherapy for weight loss: She is currently taking  Metformin and Semaglutide  for medical weight loss.  Denies side effects.    Review of Systems:  Pertinent positives were addressed with patient today.   Reviewed by clinician on day of visit: allergies, medications, problem list, medical history, surgical history, family history, social history, and previous encounter notes.  Weight Summary and Biometrics   Weight Lost Since Last Visit: 0lb  Weight Gained Since Last Visit: 0lb   Vitals Temp: 98.3 F (36.8 C) BP: 127/83 Pulse  Rate: 76 SpO2: 96 %   Anthropometric Measurements Height: 5\' 1"  (1.549 m) Weight: 265 lb (120.2 kg) BMI (Calculated): 50.1 Weight at Last Visit: 265lb Weight Lost Since Last Visit: 0lb Weight Gained Since Last Visit: 0lb Starting Weight: 266lb Total Weight Loss (lbs): 1 lb (0.454 kg) Peak Weight: 266lb   Body Composition  Body Fat %: 36.1 % Fat Mass (lbs): 95.8 lbs Muscle Mass (lbs): 161.2 lbs Total Body Water (lbs): 104 lbs Visceral Fat Rating : 15   Other Clinical Data Fasting: no Labs: no Today's Visit #: 47 Starting Date: 09/18/19     Objective:   PHYSICAL EXAM: Blood pressure 127/83, pulse 76, temperature 98.3 F (36.8 C), height 5\' 1"  (1.549 m), weight 265 lb (120.2 kg), SpO2 96%. Body mass index is 50.07 kg/m.  General: Well Developed, well nourished, and in no acute distress.  HEENT: Normocephalic, atraumatic Skin: Warm and dry, cap RF less 2 sec, good turgor Chest:  Normal excursion, shape, no gross abn Respiratory: speaking in full sentences, no conversational dyspnea NeuroM-Sk: Ambulates w/o assistance, moves * 4 Psych: A and O *3, insight good, mood-full  DIAGNOSTIC DATA REVIEWED:  BMET    Component Value Date/Time   NA 140 08/09/2022 1457   K 4.2 08/09/2022 1457   CL 101 08/09/2022 1457   CO2 27 08/09/2022 1457   GLUCOSE 44 (L) 08/09/2022 1457   GLUCOSE 110 (H) 03/02/2015 1534   BUN 21 08/09/2022 1457   CREATININE 0.96 08/09/2022 1457   CALCIUM 10.1 08/09/2022 1457   GFRNONAA 66 10/27/2020 0000   GFRAA 77 10/27/2020 0000   Lab Results  Component Value Date   HGBA1C 6.8 (H) 08/09/2022   HGBA1C 9.1 06/27/2006   Lab Results  Component Value Date   INSULIN 23.8 05/03/2022   INSULIN 10.1 04/29/2021   Lab Results  Component Value Date   TSH 1.320 04/29/2021   CBC    Component Value Date/Time   WBC 6.4 10/27/2020 0000   WBC 6.7 03/02/2015 1534   RBC 5.17 (A) 10/27/2020 0000   HGB 13.9 10/27/2020 0000   HGB 15.3 09/18/2019  1455   HCT 44 10/27/2020 0000   HCT 47.5 (H) 09/18/2019 1455   PLT 283 10/27/2020 0000   PLT 312 09/18/2019 1455   MCV 85 09/18/2019 1455   MCH 27.4 09/18/2019 1455   MCH 27.0 03/02/2015 1534   MCHC 32.2 09/18/2019 1455  MCHC 31.3 03/02/2015 1534   RDW 13.5 09/18/2019 1455   Iron Studies No results found for: "IRON", "TIBC", "FERRITIN", "IRONPCTSAT" Lipid Panel     Component Value Date/Time   CHOL 145 05/03/2022 1018   TRIG 127 05/03/2022 1018   HDL 44 05/03/2022 1018   CHOLHDL 3.3 05/03/2022 1018   CHOLHDL 2.6 Ratio 06/27/2008 2132   VLDL 23 06/27/2008 2132   LDLCALC 78 05/03/2022 1018   LDLDIRECT 66 04/09/2009 2134   Hepatic Function Panel     Component Value Date/Time   PROT 7.9 05/03/2022 1018   ALBUMIN 4.4 05/03/2022 1018   AST 16 05/03/2022 1018   ALT 14 05/03/2022 1018   ALKPHOS 98 05/03/2022 1018   BILITOT 0.5 05/03/2022 1018      Component Value Date/Time   TSH 1.320 04/29/2021 0849   Nutritional Lab Results  Component Value Date   VD25OH 51.3 08/09/2022   VD25OH 52.7 05/03/2022   VD25OH 64.9 08/26/2021    Attestations:   This encounter took 40 total minutes of time including any pre-visit and [name]-visit time spent on this date of service, including taking a thorough history, reviewing any labs and/or imaging, reviewing prior notes, as well as documenting in the electronic health record on the date of service. Over 50% of that time was in direct face-to-face counseling and coordinating care for the patient today  I, Clinical biochemist, acting as a Stage manager for [employer], DO., have compiled all relevant documentation for today's office visit on behalf of Thomasene Lot, DO, while in the presence of [employer], DO.  I have reviewed the above documentation for accuracy and completeness, and I agree with the above. [name], D.O.  The 21st Century Cures Act was signed into law in 2016 which includes the topic of electronic health  records.  This provides immediate access to information in MyChart.  This includes consultation notes, operative notes, office notes, lab results and pathology reports.  If you have any questions about what you read please let us know at your next visit so we can discuss your concerns and take corrective action if need be.  We are right here with you.

## 2022-11-04 ENCOUNTER — Other Ambulatory Visit (INDEPENDENT_AMBULATORY_CARE_PROVIDER_SITE_OTHER): Payer: Self-pay | Admitting: Family Medicine

## 2022-11-04 DIAGNOSIS — E559 Vitamin D deficiency, unspecified: Secondary | ICD-10-CM

## 2022-11-14 ENCOUNTER — Other Ambulatory Visit (INDEPENDENT_AMBULATORY_CARE_PROVIDER_SITE_OTHER): Payer: Self-pay | Admitting: Family Medicine

## 2022-11-14 DIAGNOSIS — E1169 Type 2 diabetes mellitus with other specified complication: Secondary | ICD-10-CM

## 2022-11-15 ENCOUNTER — Encounter: Payer: Self-pay | Admitting: Physician Assistant

## 2022-11-15 ENCOUNTER — Other Ambulatory Visit (INDEPENDENT_AMBULATORY_CARE_PROVIDER_SITE_OTHER): Payer: Medicare HMO

## 2022-11-15 ENCOUNTER — Ambulatory Visit (INDEPENDENT_AMBULATORY_CARE_PROVIDER_SITE_OTHER): Payer: Medicare HMO | Admitting: Physician Assistant

## 2022-11-15 VITALS — Ht 60.0 in | Wt 267.0 lb

## 2022-11-15 DIAGNOSIS — M25561 Pain in right knee: Secondary | ICD-10-CM

## 2022-11-15 DIAGNOSIS — G8929 Other chronic pain: Secondary | ICD-10-CM

## 2022-11-15 DIAGNOSIS — M1712 Unilateral primary osteoarthritis, left knee: Secondary | ICD-10-CM

## 2022-11-15 DIAGNOSIS — M25562 Pain in left knee: Secondary | ICD-10-CM | POA: Diagnosis not present

## 2022-11-15 MED ORDER — METHYLPREDNISOLONE ACETATE 40 MG/ML IJ SUSP
60.0000 mg | INTRAMUSCULAR | Status: AC | PRN
  Administered 2022-11-15: 60 mg via INTRA_ARTICULAR

## 2022-11-15 MED ORDER — LIDOCAINE HCL 1 % IJ SOLN
2.0000 mL | INTRAMUSCULAR | Status: AC | PRN
  Administered 2022-11-15: 2 mL

## 2022-11-15 MED ORDER — BUPIVACAINE HCL 0.25 % IJ SOLN
2.0000 mL | INTRAMUSCULAR | Status: AC | PRN
  Administered 2022-11-15: 2 mL via INTRA_ARTICULAR

## 2022-11-15 NOTE — Progress Notes (Signed)
Office Visit Note   Patient: [name]           Date of Birth: [birth year]           MRN: [id number] Visit Date: 11/15/2022              Requested by: Thana Ates, MD [address] [place],  [place] [address] PCP: Thana Ates, MD  Chief Complaint  Patient presents with  . Right Knee - Pain  . Left Knee - Pain      HPI: 62 year old woman with Right knee pain . Makes it difficult to walk. Was seen for left knee pain a few months ago and received a steroid injection. . Says it helped some. Takes Tramadol. Both knees swell . Rates her pain as moderate, worse when ambulating  Assessment & Plan: Visit Diagnoses:  1. Chronic pain of right knee     Plan: Patient has bilateral osteoarthritis of her knees. Had some relief with a steroid injection into the left knee. Her current BMI is 52. We discussed PT and she is willing to try this. Will also go forward with an injection into her left knee. She is diabetic and we will have her return in 2 weeks for an injection into her right knee. Will also consider viscosupplementation  Follow-Up Instructions: Return in about 2 weeks (around 11/29/2022).   Right Knee Exam   Tenderness  The patient is experiencing tenderness in the medial joint line.  Range of Motion  Extension:  normal  Flexion:  normal   Tests  Lachman:  Anterior - negative     Drawer:  Anterior - negative      Other  Sensation: normal Pulse: present Swelling: mild   Left Knee Exam   Tenderness  The patient is experiencing tenderness in the medial joint line.  Range of Motion  The patient has normal left knee ROM. Extension:  normal  Flexion:  normal   Tests  Lachman:  Anterior - negative     Drawer:  Anterior - negative          Patient is alert, oriented, no adenopathy, well-dressed, normal affect, normal respiratory effort.   Imaging: XR KNEE 3 VIEW RIGHT  Result Date: 11/15/2022 Tricompartmental arthritis. No acute  fracture  No images are attached to the encounter.  Labs: Lab Results  Component Value Date   HGBA1C 6.8 (H) 08/09/2022   HGBA1C 7.0 (H) 05/03/2022   HGBA1C 6.6 (H) 08/26/2021     Lab Results  Component Value Date   ALBUMIN 4.4 05/03/2022   ALBUMIN 4.2 04/29/2021   ALBUMIN 7.0 (A) 10/27/2020    Lab Results  Component Value Date   MG 2.3 09/18/2019   Lab Results  Component Value Date   VD25OH 51.3 08/09/2022   VD25OH 52.7 05/03/2022   VD25OH 64.9 08/26/2021    No results found for: "PREALBUMIN"    Latest Ref Rng & Units 10/27/2020   12:00 AM 11/29/2019   12:00 AM 09/18/2019    2:55 PM  CBC EXTENDED  WBC  6.4     6.5     6.2   RBC 3.87 - 5.11 5.17      5.58   Hemoglobin 12.0 - 16.0 13.9     14.5     15.3   HCT 36 - 46 44     47     47.5   Platelets 150 - 399 283  349     312   NEUT# 1.4 - 7.0 x10E3/uL   2.9   Lymph# 0.7 - 3.1 x10E3/uL   2.6      This result is from an external source.     Body mass index is 52.14 kg/m.  Orders:  Orders Placed This Encounter  Procedures  . XR KNEE 3 VIEW RIGHT  . Ambulatory referral to Physical Therapy   No orders of the defined types were placed in this encounter.    Procedures: Large Joint Inj: L knee on 11/15/2022 10:27 AM Indications: pain and diagnostic evaluation Details: 25 G 1.5 in needle, anteromedial approach  Arthrogram: No  Medications: 60 mg methylPREDNISolone acetate 40 MG/ML; 2 mL lidocaine 1 %; 2 mL bupivacaine 0.25 % Outcome: tolerated well, no immediate complications Procedure, treatment alternatives, risks and benefits explained, specific risks discussed. Consent was given by the patient.    Clinical Data: No additional findings.  ROS:  All other systems negative, except as noted in the HPI. Review of Systems  Objective: Vital Signs: Ht 5' (1.524 m)   Wt 267 lb (121.1 kg)   BMI 52.14 kg/m   Specialty Comments:  No specialty comments available.  PMFS History: Patient Active Problem  List   Diagnosis Date Noted  . Morbid obesity (HCC)-start bmi 51.47 05/03/2022  . OSA (obstructive sleep apnea) 04/01/2022  . Type 2 diabetes mellitus with obesity (HCC) 08/26/2021  . At risk for heart disease 05/25/2021  . Benign neoplasm of colon   . Depression 06/02/2020  . Other chronic pain 05/15/2020  . SOB (shortness of breath) 04/05/2020  . Hyperlipidemia associated with type 2 diabetes mellitus (HCC) 02/04/2020  . Vitamin D deficiency 01/16/2020  . Encounter for screening colonoscopy 01/16/2020  . Major depressive disorder, recurrent episode, mild (HCC) 01/12/2020  . Hypertension associated with type 2 diabetes mellitus (HCC) 01/02/2020  . BMI 50.0-59.9, adult (HCC) 05/21/2019  . Chronic pain of left knee 05/21/2019  . Chronic pain of both knees 05/21/2019  . Unilateral primary osteoarthritis, left knee 05/21/2019  . Unilateral primary osteoarthritis, right knee 05/21/2019  . ACROCHORDON 11/12/2009  . PERIPHERAL EDEMA 08/01/2009  . Pain in joint, lower leg 07/07/2009  . SINUS TACHYCARDIA 04/09/2009  . BACK PAIN, LUMBOSACRAL, CHRONIC 06/17/2008  . SHOULDER PAIN, RIGHT, CHRONIC 05/21/2008  . PERIPHERAL NEUROPATHY, MILD 03/05/2008  . 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  . Diabetes mellitus (HCC) 05/19/2006  . HYPERCHOLESTEROLEMIA 05/19/2006  . OBESITY, NOS 05/19/2006  . DEPRESSIVE DISORDER, NOS 05/19/2006  . HYPERTENSION, BENIGN SYSTEMIC 05/19/2006  . RHINITIS, ALLERGIC 05/19/2006  . GASTROESOPHAGEAL REFLUX, NO ESOPHAGITIS 05/19/2006   Past Medical History:  Diagnosis Date  . Acid reflux   . Arthritis   . Back pain   . Depression   . Diabetes mellitus without complication (HCC)   . Edema, lower extremity   . Glaucoma   . High cholesterol   . Hypertension   . Joint pain   . Nerve damage   . Shortness of breath   . Vitamin D deficiency     Family History  Problem Relation Age of Onset  . Obesity Mother   . Eating disorder Mother   . Hypertension Father   .  Hyperlipidemia Father   . Alcoholism Father     Past Surgical History:  Procedure Laterality Date  . btl    . CHOLECYSTECTOMY  2001  . COLONOSCOPY WITH PROPOFOL N/A 01/06/2021   Procedure: COLONOSCOPY WITH PROPOFOL;  Surgeon: Benancio Deeds, MD;  Location: [facility];  Service: Gastroenterology;  Laterality: N/A;  . POLYPECTOMY  01/06/2021   Procedure: POLYPECTOMY;  Surgeon: Benancio Deeds, MD;  Location: [facility];  Service: Gastroenterology;;   Social History   Occupational History  . Occupation: [employer]  Tobacco Use  . Smoking status: Former    Current packs/day: 0.00    Types: Cigarettes    Quit date: 2001    Years since quitting: 23.6  . Smokeless tobacco: Never  Substance and Sexual Activity  . Alcohol use: No  . Drug use: No  . Sexual activity: Not on file

## 2022-11-20 ENCOUNTER — Other Ambulatory Visit (INDEPENDENT_AMBULATORY_CARE_PROVIDER_SITE_OTHER): Payer: Self-pay | Admitting: Family Medicine

## 2022-11-20 DIAGNOSIS — Z794 Long term (current) use of insulin: Secondary | ICD-10-CM

## 2022-11-29 ENCOUNTER — Ambulatory Visit (INDEPENDENT_AMBULATORY_CARE_PROVIDER_SITE_OTHER): Payer: Medicare HMO | Admitting: Physician Assistant

## 2022-11-29 ENCOUNTER — Encounter: Payer: Self-pay | Admitting: Physician Assistant

## 2022-11-29 DIAGNOSIS — M1711 Unilateral primary osteoarthritis, right knee: Secondary | ICD-10-CM | POA: Diagnosis not present

## 2022-11-29 MED ORDER — LIDOCAINE HCL 1 % IJ SOLN
2.0000 mL | INTRAMUSCULAR | Status: AC | PRN
  Administered 2022-11-29: 2 mL

## 2022-11-29 MED ORDER — BUPIVACAINE HCL 0.25 % IJ SOLN
2.0000 mL | INTRAMUSCULAR | Status: AC | PRN
  Administered 2022-11-29: 2 mL via INTRA_ARTICULAR

## 2022-11-29 MED ORDER — METHYLPREDNISOLONE ACETATE 40 MG/ML IJ SUSP
80.0000 mg | INTRAMUSCULAR | Status: AC | PRN
  Administered 2022-11-29: 80 mg via INTRA_ARTICULAR

## 2022-11-29 NOTE — Progress Notes (Signed)
Office Visit Note   Patient: [name]           Date of Birth: 1961/02/22           MRN: [id number] Visit Date: 11/29/2022              Requested by: Thana Ates, MD [address] [place],  [place] [address] PCP: Thana Ates, MD  Chief Complaint  Patient presents with  . Knee Pain    Inj right knee      HPI: [name] is a pleasant 62 year old woman with bilateral osteoarthritis of her knees.  She is also diabetic but fairly well-controlled.  She came in 2 weeks ago for injection into her left knee and she has had good relief.  Presents today for an injection in her right knee.  No new injuries no fever or chills  Assessment & Plan: Visit Diagnoses: Osteoarthritis bilateral knees  Plan: Went forward with a right knee injection today she may follow-up with [place] as needed  Follow-Up Instructions: As needed  Ortho Exam  Patient is alert, oriented, no adenopathy, well-dressed, normal affect, normal respiratory effort. Right knee no effusion no erythema compartments are soft and nontender she is neurovascularly intact negative Denna Haggard' sign  Imaging: No results found. No images are attached to the encounter.  Labs: Lab Results  Component Value Date   HGBA1C 6.8 (H) 08/09/2022   HGBA1C 7.0 (H) 05/03/2022   HGBA1C 6.6 (H) 08/26/2021     Lab Results  Component Value Date   ALBUMIN 4.4 05/03/2022   ALBUMIN 4.2 04/29/2021   ALBUMIN 7.0 (A) 10/27/2020    Lab Results  Component Value Date   MG 2.3 09/18/2019   Lab Results  Component Value Date   VD25OH 51.3 08/09/2022   VD25OH 52.7 05/03/2022   VD25OH 64.9 08/26/2021    No results found for: "PREALBUMIN"    Latest Ref Rng & Units 10/27/2020   12:00 AM 11/29/2019   12:00 AM 09/18/2019    2:55 PM  CBC EXTENDED  WBC  6.4     6.5     6.2   RBC 3.87 - 5.11 5.17      5.58   Hemoglobin 12.0 - 16.0 13.9     14.5     15.3   HCT 36 - 46 44     47     47.5   Platelets 150 - 399 283     349     312    NEUT# 1.4 - 7.0 x10E3/uL   2.9   Lymph# 0.7 - 3.1 x10E3/uL   2.6      This result is from an external source.     There is no height or weight on file to calculate BMI.  Orders:  No orders of the defined types were placed in this encounter.  No orders of the defined types were placed in this encounter.    Procedures: Large Joint Inj: R knee on 11/29/2022 11:09 AM Indications: pain and diagnostic evaluation Details: 25 G 1.5 in needle, anteromedial approach  Arthrogram: No  Medications: 80 mg methylPREDNISolone acetate 40 MG/ML; 2 mL lidocaine 1 %; 2 mL bupivacaine 0.25 % Outcome: tolerated well, no immediate complications Procedure, treatment alternatives, risks and benefits explained, specific risks discussed. Consent was given by the patient.    Clinical Data: No additional findings.  ROS:  All other systems negative, except as noted in the HPI. Review of Systems  Objective: Vital Signs: There were no vitals taken for this visit.  Specialty Comments:  No specialty comments available.  PMFS History: Patient Active Problem List   Diagnosis Date Noted  . Morbid obesity (HCC)-start bmi 51.47 05/03/2022  . OSA (obstructive sleep apnea) 04/01/2022  . Type 2 diabetes mellitus with obesity (HCC) 08/26/2021  . At risk for heart disease 05/25/2021  . Benign neoplasm of colon   . Depression 06/02/2020  . Other chronic pain 05/15/2020  . SOB (shortness of breath) 04/05/2020  . Hyperlipidemia associated with type 2 diabetes mellitus (HCC) 02/04/2020  . Vitamin D deficiency 01/16/2020  . Encounter for screening colonoscopy 01/16/2020  . Major depressive disorder, recurrent episode, mild (HCC) 01/12/2020  . Hypertension associated with type 2 diabetes mellitus (HCC) 01/02/2020  . BMI 50.0-59.9, adult (HCC) 05/21/2019  . Chronic pain of left knee 05/21/2019  . Chronic pain of both knees 05/21/2019  . Unilateral primary osteoarthritis, left knee 05/21/2019  . Unilateral  primary osteoarthritis, right knee 05/21/2019  . ACROCHORDON 11/12/2009  . PERIPHERAL EDEMA 08/01/2009  . Pain in joint, lower leg 07/07/2009  . SINUS TACHYCARDIA 04/09/2009  . BACK PAIN, LUMBOSACRAL, CHRONIC 06/17/2008  . SHOULDER PAIN, RIGHT, CHRONIC 05/21/2008  . PERIPHERAL NEUROPATHY, MILD 03/05/2008  . 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  . Diabetes mellitus (HCC) 05/19/2006  . HYPERCHOLESTEROLEMIA 05/19/2006  . OBESITY, NOS 05/19/2006  . DEPRESSIVE DISORDER, NOS 05/19/2006  . HYPERTENSION, BENIGN SYSTEMIC 05/19/2006  . RHINITIS, ALLERGIC 05/19/2006  . GASTROESOPHAGEAL REFLUX, NO ESOPHAGITIS 05/19/2006   Past Medical History:  Diagnosis Date  . Acid reflux   . Arthritis   . Back pain   . Depression   . Diabetes mellitus without complication (HCC)   . Edema, lower extremity   . Glaucoma   . High cholesterol   . Hypertension   . Joint pain   . Nerve damage   . Shortness of breath   . Vitamin D deficiency     Family History  Problem Relation Age of Onset  . Obesity Mother   . Eating disorder Mother   . Hypertension Father   . Hyperlipidemia Father   . Alcoholism Father     Past Surgical History:  Procedure Laterality Date  . btl    . CHOLECYSTECTOMY  2001  . COLONOSCOPY WITH PROPOFOL N/A 01/06/2021   Procedure: COLONOSCOPY WITH PROPOFOL;  Surgeon: Benancio Deeds, MD;  Location: [facility];  Service: Gastroenterology;  Laterality: N/A;  . POLYPECTOMY  01/06/2021   Procedure: POLYPECTOMY;  Surgeon: Benancio Deeds, MD;  Location: [facility];  Service: Gastroenterology;;   Social History   Occupational History  . Occupation: [employer]  Tobacco Use  . Smoking status: Former    Current packs/day: 0.00    Types: Cigarettes    Quit date: 2001    Years since quitting: 23.7  . Smokeless tobacco: Never  Substance and Sexual Activity  . Alcohol use: No  . Drug use: No  . Sexual activity: Not on file

## 2022-12-02 ENCOUNTER — Ambulatory Visit: Payer: Medicare HMO | Admitting: Physical Therapy

## 2022-12-13 ENCOUNTER — Ambulatory Visit (INDEPENDENT_AMBULATORY_CARE_PROVIDER_SITE_OTHER): Payer: Medicare HMO | Admitting: Family Medicine

## 2022-12-13 ENCOUNTER — Encounter (INDEPENDENT_AMBULATORY_CARE_PROVIDER_SITE_OTHER): Payer: Self-pay | Admitting: Family Medicine

## 2022-12-13 VITALS — BP 112/74 | HR 80 | Temp 98.1°F | Ht 61.0 in | Wt 256.0 lb

## 2022-12-13 DIAGNOSIS — E559 Vitamin D deficiency, unspecified: Secondary | ICD-10-CM | POA: Diagnosis not present

## 2022-12-13 DIAGNOSIS — Z6841 Body Mass Index (BMI) 40.0 and over, adult: Secondary | ICD-10-CM

## 2022-12-13 DIAGNOSIS — E1169 Type 2 diabetes mellitus with other specified complication: Secondary | ICD-10-CM | POA: Diagnosis not present

## 2022-12-13 DIAGNOSIS — Z7984 Long term (current) use of oral hypoglycemic drugs: Secondary | ICD-10-CM | POA: Diagnosis not present

## 2022-12-13 DIAGNOSIS — Z794 Long term (current) use of insulin: Secondary | ICD-10-CM

## 2022-12-13 MED ORDER — SEMAGLUTIDE (2 MG/DOSE) 8 MG/3ML ~~LOC~~ SOPN: 2.0000 mg | PEN_INJECTOR | SUBCUTANEOUS | 0 refills | Status: DC

## 2022-12-13 MED ORDER — ERGOCALCIFEROL 1.25 MG (50000 UT) PO CAPS: ORAL_CAPSULE | ORAL | 0 refills | Status: DC

## 2022-12-13 NOTE — Progress Notes (Signed)
[name], D.O.  ABFM, ABOM Specializing in Clinical Bariatric Medicine  Office located at: [address] [place], [place]  [address]     Assessment and Plan:   No orders of the defined types were placed in this encounter.   There are no discontinued medications.   No orders of the defined types were placed in this encounter.    Pt going to PCP on Wednesday and will obtain full set of labs and will bring into next visit.   Vitamin D deficiency Taking every Thursday and Sunday We discussed her latest vit D levels from 08/09/22 energy  Type 2 diabetes mellitus with other specified complication, with long-term current use of insulin (HCC) ***  In 06/2022 her lantus was decreased and her Metformin increased from 500 mg bid to 1000 mg bid due to Pt having low blood glucose levels in the mornings. She currently taking *** 40 in AM and 20 in PM.   C/w ozempic managed by me as well as lantus, invokana, and metformin per PCP.   Recommend chat gpt to search for healthy recipes.  TREATMENT PLAN FOR OBESITY:   Assessment:  [name] is here to discuss her progress with her obesity treatment plan along with follow-up of her obesity related diagnoses. See Medical Weight Management Flowsheet for complete bioelectrical impedance results.  Since last office visit on *** patient's  Muscle mass has {DID:29233} by ***lb. Fat mass has {DID:29233} by ***lb. Total body water has {DID:29233} by ***lb.  Counseling done on how various foods will affect these numbers and how to maximize success  Total lbs lost to date: *** Total weight loss percentage to date: ***   Behavioral Intervention Additional resources provided today: {weightresources:29185} Evidence-based interventions for health behavior change were utilized today including the discussion of self monitoring techniques, problem-solving barriers and SMART goal setting techniques.   Regarding patient's less desirable  eating habits and patterns, we employed the technique of small changes.  Pt will specifically work on: Ecologist for next visit.    FOLLOW UP: No follow-ups on file.*** She was informed of the importance of frequent follow up visits to maximize her success with intensive lifestyle modifications for her multiple health conditions.  Subjective:   Chief complaint: Obesity [name] is here to discuss her progress with her obesity treatment plan. She is on the {MWMwtlossportion/plan2:23431} and states she is following her eating plan approximately 90% of the time. She states she is walking 15 mins 4 days a week.   Interval History:  [name] is here for a follow up office visit. Since last OV,  ***  Been doing overall well Eating more home cooked meals and exercising.  Typically eats out sometimes Participating in [employer] sne[employer] but is following our meal plan.  Previously wasn't eating bfast much so her PCP advised to drink Insure.   Pharmacotherapy for weight loss: She {srtis (Optional):29129} currently taking {srtpreviousweightlossmeds (Optional):29124} for medical weight loss.  Denies side effects.    Review of Systems:  Pertinent positives were addressed with patient today.  Reviewed by clinician on day of visit: allergies, medications, problem list, medical history, surgical history, family history, social history, and previous encounter notes.  Weight Summary and Biometrics   Weight Lost Since Last Visit: 9lb  Weight Gained Since Last Visit: 0lb  ***  Vitals Temp: 98.1 F (36.7 C) BP: 112/74 Pulse Rate: 80 SpO2: 99 %   Anthropometric Measurements Height: 5\' 1"  (1.549 m) Weight:  256 lb (116.1 kg) BMI (Calculated): 48.4 Weight at Last Visit: 265lb Weight Lost Since Last Visit: 9lb Weight Gained Since Last Visit: 0lb Starting Weight: 266lb Total Weight Loss (lbs): 10 lb (4.536 kg) Peak Weight: 266lb   Body Composition  Body Fat %: 51.2 % Fat Mass  (lbs): 131.4 lbs Muscle Mass (lbs): 119 lbs Total Body Water (lbs): 87.2 lbs Visceral Fat Rating : 20   Other Clinical Data Fasting: yes Labs: no Today's Visit #: 48 Starting Date: 09/18/19    Objective:   PHYSICAL EXAM: Blood pressure 112/74, pulse 80, temperature 98.1 F (36.7 C), height 5\' 1"  (1.549 m), weight 256 lb (116.1 kg), SpO2 99%. Body mass index is 48.37 kg/m.  General: Well Developed, well nourished, and in no acute distress.  HEENT: Normocephalic, atraumatic Skin: Warm and dry, cap RF less 2 sec, good turgor Chest:  Normal excursion, shape, no gross abn Respiratory: speaking in full sentences, no conversational dyspnea NeuroM-Sk: Ambulates w/o assistance, moves * 4 Psych: A and O *3, insight good, mood-full  DIAGNOSTIC DATA REVIEWED:  BMET    Component Value Date/Time   NA 140 08/09/2022 1457   K 4.2 08/09/2022 1457   CL 101 08/09/2022 1457   CO2 27 08/09/2022 1457   GLUCOSE 44 (L) 08/09/2022 1457   GLUCOSE 110 (H) 03/02/2015 1534   BUN 21 08/09/2022 1457   CREATININE 0.96 08/09/2022 1457   CALCIUM 10.1 08/09/2022 1457   GFRNONAA 66 10/27/2020 0000   GFRAA 77 10/27/2020 0000   Lab Results  Component Value Date   HGBA1C 6.8 (H) 08/09/2022   HGBA1C 9.1 06/27/2006   Lab Results  Component Value Date   INSULIN 23.8 05/03/2022   INSULIN 10.1 04/29/2021   Lab Results  Component Value Date   TSH 1.320 04/29/2021   CBC    Component Value Date/Time   WBC 6.4 10/27/2020 0000   WBC 6.7 03/02/2015 1534   RBC 5.17 (A) 10/27/2020 0000   HGB 13.9 10/27/2020 0000   HGB 15.3 09/18/2019 1455   HCT 44 10/27/2020 0000   HCT 47.5 (H) 09/18/2019 1455   PLT 283 10/27/2020 0000   PLT 312 09/18/2019 1455   MCV 85 09/18/2019 1455   MCH 27.4 09/18/2019 1455   MCH 27.0 03/02/2015 1534   MCHC 32.2 09/18/2019 1455   MCHC 31.3 03/02/2015 1534   RDW 13.5 09/18/2019 1455   Iron Studies No results found for: "IRON", "TIBC", "FERRITIN", "IRONPCTSAT" Lipid  Panel     Component Value Date/Time   CHOL 145 05/03/2022 1018   TRIG 127 05/03/2022 1018   HDL 44 05/03/2022 1018   CHOLHDL 3.3 05/03/2022 1018   CHOLHDL 2.6 Ratio 06/27/2008 2132   VLDL 23 06/27/2008 2132   LDLCALC 78 05/03/2022 1018   LDLDIRECT 66 04/09/2009 2134   Hepatic Function Panel     Component Value Date/Time   PROT 7.9 05/03/2022 1018   ALBUMIN 4.4 05/03/2022 1018   AST 16 05/03/2022 1018   ALT 14 05/03/2022 1018   ALKPHOS 98 05/03/2022 1018   BILITOT 0.5 05/03/2022 1018      Component Value Date/Time   TSH 1.320 04/29/2021 0849   Nutritional Lab Results  Component Value Date   VD25OH 51.3 08/09/2022   VD25OH 52.7 05/03/2022   VD25OH 64.9 08/26/2021    Attestations:   I, Isabelle Course, acting as a Stage manager for Thomasene Lot, DO., have compiled all relevant documentation for today's office visit on behalf of Thomasene Lot, DO,  while in the presence of [employer], DO.  I have reviewed the above documentation for accuracy and completeness, and I agree with the above. [name], D.O.  The 21st Century Cures Act was signed into law in 2016 which includes the topic of electronic health records.  This provides immediate access to information in MyChart.  This includes consultation notes, operative notes, office notes, lab results and pathology reports.  If you have any questions about what you read please let us know at your next visit so we can discuss your concerns and take corrective action if need be.  We are right here with you.

## 2022-12-15 ENCOUNTER — Encounter (INDEPENDENT_AMBULATORY_CARE_PROVIDER_SITE_OTHER): Payer: Self-pay | Admitting: Family Medicine

## 2022-12-15 DIAGNOSIS — E1142 Type 2 diabetes mellitus with diabetic polyneuropathy: Secondary | ICD-10-CM | POA: Diagnosis not present

## 2022-12-15 DIAGNOSIS — G4733 Obstructive sleep apnea (adult) (pediatric): Secondary | ICD-10-CM | POA: Diagnosis not present

## 2022-12-15 DIAGNOSIS — E559 Vitamin D deficiency, unspecified: Secondary | ICD-10-CM | POA: Diagnosis not present

## 2022-12-15 DIAGNOSIS — Z23 Encounter for immunization: Secondary | ICD-10-CM | POA: Diagnosis not present

## 2022-12-15 DIAGNOSIS — Z0000 Encounter for general adult medical examination without abnormal findings: Secondary | ICD-10-CM | POA: Diagnosis not present

## 2022-12-15 DIAGNOSIS — I10 Essential (primary) hypertension: Secondary | ICD-10-CM | POA: Diagnosis not present

## 2022-12-15 DIAGNOSIS — Z1331 Encounter for screening for depression: Secondary | ICD-10-CM | POA: Diagnosis not present

## 2022-12-15 DIAGNOSIS — F419 Anxiety disorder, unspecified: Secondary | ICD-10-CM | POA: Diagnosis not present

## 2022-12-15 DIAGNOSIS — M1991 Primary osteoarthritis, unspecified site: Secondary | ICD-10-CM | POA: Diagnosis not present

## 2022-12-15 DIAGNOSIS — F3341 Major depressive disorder, recurrent, in partial remission: Secondary | ICD-10-CM | POA: Diagnosis not present

## 2022-12-15 DIAGNOSIS — E7800 Pure hypercholesterolemia, unspecified: Secondary | ICD-10-CM | POA: Diagnosis not present

## 2022-12-15 DIAGNOSIS — Z79899 Other long term (current) drug therapy: Secondary | ICD-10-CM | POA: Diagnosis not present

## 2022-12-15 LAB — LAB REPORT - SCANNED: EGFR: 74

## 2022-12-15 LAB — HEMOGLOBIN A1C: Hemoglobin A1C: 6.7

## 2022-12-22 DIAGNOSIS — G4733 Obstructive sleep apnea (adult) (pediatric): Secondary | ICD-10-CM | POA: Diagnosis not present

## 2022-12-22 NOTE — Therapy (Signed)
OUTPATIENT PHYSICAL THERAPY LOWER EXTREMITY EVALUATION   Patient Name: [name] MRN: [id number] DOB:1961/02/08, 62 y.o., female Today's Date: 12/23/2022  END OF SESSION:  PT End of Session - 12/23/22 1525     Visit Number 1    Number of Visits 17    Date for PT Re-Evaluation 02/17/23    Authorization Type humana medicare    Authorization Time Period auth tbd    PT Start Time 1528    PT Stop Time 1614    PT Time Calculation (min) 46 min    Activity Tolerance Patient tolerated treatment well    Behavior During Therapy WFL for tasks assessed/performed             Past Medical History:  Diagnosis Date   Acid reflux    Arthritis    Back pain    Depression    Diabetes mellitus without complication (HCC)    Edema, lower extremity    Glaucoma    High cholesterol    Hypertension    Joint pain    Nerve damage    Shortness of breath    Vitamin D deficiency    Past Surgical History:  Procedure Laterality Date   btl     CHOLECYSTECTOMY  2001   COLONOSCOPY WITH PROPOFOL N/A 01/06/2021   Procedure: COLONOSCOPY WITH PROPOFOL;  Surgeon: Benancio Deeds, MD;  Location: [facility];  Service: Gastroenterology;  Laterality: N/A;   POLYPECTOMY  01/06/2021   Procedure: POLYPECTOMY;  Surgeon: Benancio Deeds, MD;  Location: [facility];  Service: Gastroenterology;;   Patient Active Problem List   Diagnosis Date Noted   Morbid obesity (HCC)-start bmi 51.47 05/03/2022   OSA (obstructive sleep apnea) 04/01/2022   Type 2 diabetes mellitus with obesity (HCC) 08/26/2021   At risk for heart disease 05/25/2021   Benign neoplasm of colon    Depression 06/02/2020   Other chronic pain 05/15/2020   SOB (shortness of breath) 04/05/2020   Hyperlipidemia associated with type 2 diabetes mellitus (HCC) 02/04/2020   Vitamin D deficiency 01/16/2020   Encounter for screening colonoscopy 01/16/2020   Major depressive disorder, recurrent episode, mild (HCC) 01/12/2020    Hypertension associated with type 2 diabetes mellitus (HCC) 01/02/2020   BMI 50.0-59.9, adult (HCC) 05/21/2019   Chronic pain of left knee 05/21/2019   Chronic pain of both knees 05/21/2019   Unilateral primary osteoarthritis, left knee 05/21/2019   Unilateral primary osteoarthritis, right knee 05/21/2019   ACROCHORDON 11/12/2009   PERIPHERAL EDEMA 08/01/2009   Pain in joint, lower leg 07/07/2009   SINUS TACHYCARDIA 04/09/2009   BACK PAIN, LUMBOSACRAL, CHRONIC 06/17/2008   SHOULDER PAIN, RIGHT, CHRONIC 05/21/2008   PERIPHERAL NEUROPATHY, MILD 03/05/2008   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   Diabetes mellitus (HCC) 05/19/2006   HYPERCHOLESTEROLEMIA 05/19/2006   OBESITY, NOS 05/19/2006   DEPRESSIVE DISORDER, NOS 05/19/2006   HYPERTENSION, BENIGN SYSTEMIC 05/19/2006   RHINITIS, ALLERGIC 05/19/2006   GASTROESOPHAGEAL REFLUX, NO ESOPHAGITIS 05/19/2006    PCP: Thana Ates, MD  REFERRING PROVIDER: Persons, [place], [place]  REFERRING DIAG: [phone] (ICD-10-CM) - Chronic pain of right knee (referral comment: eval and treat bilateral knees)  THERAPY DIAG:  Chronic pain of both knees  Muscle weakness (generalized)  Rationale for Evaluation and Treatment: Rehabilitation  ONSET DATE: a few years  SUBJECTIVE:   SUBJECTIVE STATEMENT: Endorses gradual onset of pain over past few years, no MOI. Slowly worsening. States she was told she has arthritis. States her L leg in particular will  give out when fatigued, although this has improved.  Walks with her daughter 15 min in the morning and in the evening.   PERTINENT HISTORY: DM, HTN, SOB, depression, chronic pain multi-site PAIN:  Are you having pain: 7/10 Location/description: L>R, medial and lateral joint lines, deep; feels like grinding Best-worst over past week: 7-9/10  - aggravating factors: stairs, walking >41min, getting down on floor - Easing factors: heated blanket, movement, medication  PRECAUTIONS: None  WEIGHT  BEARING RESTRICTIONS: No  FALLS:  Has patient fallen in last 6 months? No  LIVING ENVIRONMENT: 1 level house, 5-6 STE from front with one rail on L, 2STE from back  Lives alone, gets help with yardwork. Pt independent with housework  OCCUPATION: used to work for [employer] - not working since 4-5 years ago due to reported health issues   PLOF: Independent  PATIENT GOALS: wants to be able to exercise to lose weight  NEXT MD VISIT: TBD   OBJECTIVE:   DIAGNOSTIC FINDINGS:  11/15/22 R knee XR indicative of tricompartmental arthritis, no acute fracture (per EPIC review)  08/26/22 L knee XR (refer to EPIC for details) Results narrative: "Radiographs of her left knee were reviewed today.  She does have varus  alignment with tricompartmental arthritis most notably in the medial  compartment with periarticular osteophytes loss of joint space and  sclerotic changes no acute fractures "  PATIENT SURVEYS:  FOTO 50 current, 58 predicted  COGNITION: Overall cognitive status: Within functional limits for tasks assessed     SENSATION: Grossly intact BIL   EDEMA:  No appreciable swelling on observation, obscured by pt attire - she denies swelling issues   PALPATION: Concordant generalized tenderness about knee joints   LOWER EXTREMITY ROM:     Active  Right eval Left eval  Hip flexion    Hip extension    Hip internal rotation    Hip external rotation    Knee extension Full * Full *  Knee flexion Seated: 96 * Seated: 90 deg *  (Blank rows = not tested) (Key: WFL = within functional limits not formally assessed, * = concordant pain, s = stiffness/stretching sensation, NT = not tested)  Comments:    LOWER EXTREMITY MMT:    MMT Right eval Left eval  Hip flexion 4 * 4*  Hip abduction (modified sitting) 4+ 4+  Hip internal rotation    Hip external rotation    Knee flexion 4 * 3+ *  Knee extension 4 * 3+ *  Ankle dorsiflexion     (Blank rows = not  tested) (Key: WFL = within functional limits not formally assessed, * = concordant pain, s = stiffness/stretching sensation, NT = not tested)  Comments:     LOWER EXTREMITY SPECIAL TESTS:  deferred  FUNCTIONAL TESTS:  TUG: 17.93sec no AD, inc knee pain  GAIT: Distance walked: within clinic Assistive device utilized: None Level of assistance: Complete Independence Comments: reduced gait speed/cadence, widened BOS, reduced trunk rotation, reduced step length   TODAY'S TREATMENT:  [facility] Adult PT Treatment:                                                DATE: 12/23/22 Therapeutic Exercise: LAQ x8 BIL Heel slide x8 BIL, seated w/ towel HEP handout + education    PATIENT EDUCATION:  Education details: Pt education on PT impairments, prognosis, and POC. Informed consent. Rationale for interventions, safe/appropriate HEP performance Person educated: Patient Education method: Explanation, Demonstration, Tactile cues, Verbal cues, and Handouts Education comprehension: verbalized understanding, returned demonstration, verbal cues required, tactile cues required, and needs further education    HOME EXERCISE PROGRAM: Access Code: [email or web address] URL: [email or web address] Date: 12/23/2022 Prepared by: Fransisco Hertz  Exercises - Seated Long Arc Quad  - 2-3 x daily - 7 x weekly - 1 sets - 8 reps - Seated Heel Slide  - 2-3 x daily - 7 x weekly - 1 sets - 8 reps - Sit to Stand with Armchair  - 2-3 x daily - 7 x weekly - 1 sets - 5 reps  ASSESSMENT:  CLINICAL IMPRESSION: Patient is a very pleasant 62 y.o. woman who was seen today for physical therapy evaluation and treatment for chronic knee pain. Pt endorses ongoing difficulties with WB activities, knee mobility, and stair navigation/walking. On exam, pt demonstrates limitations in BIL knee mobility and  strength, L more so than R. TUG time indicative of fall risk (see goal below). Does well with HEP/exam, mild symptom irritability but no increase in resting pain, no adverse events. Recommend trial of skilled PT to address aforementioned deficits with aim of improving functional tolerance and reducing pain with typical activities. Pt departs today's session in no acute distress, all voiced concerns/questions addressed appropriately from PT perspective.     OBJECTIVE IMPAIRMENTS: Abnormal gait, decreased activity tolerance, decreased endurance, decreased mobility, difficulty walking, decreased ROM, decreased strength, impaired perceived functional ability, improper body mechanics, and pain.   ACTIVITY LIMITATIONS: carrying, lifting, bending, standing, squatting, stairs, transfers, and locomotion level  PARTICIPATION LIMITATIONS: meal prep, cleaning, laundry, and community activity  PERSONAL FACTORS: Age, Time since onset of injury/illness/exacerbation, and 3+ comorbidities: DM, HTN, depression  are also affecting patient's functional outcome.   REHAB POTENTIAL: Fair given chronicity/comorbidities  CLINICAL DECISION MAKING: Stable/uncomplicated  EVALUATION COMPLEXITY: Low   GOALS: Goals reviewed with patient? Yes  SHORT TERM GOALS: Target date: 01/20/2023 Pt will demonstrate appropriate understanding and performance of initially prescribed HEP in order to facilitate improved independence with management of symptoms.  Baseline: HEP provided on eval Goal status: INITIAL   2. Pt will score greater than or equal to 54 on FOTO in order to demonstrate improved perception of function due to symptoms.  Baseline: 50  Goal status: INITIAL    LONG TERM GOALS: Target date: 02/17/2023 Pt will score 58 or greater on FOTO in order to demonstrate improved perception of function due to symptoms.  Baseline: 50 Goal status: INITIAL  2.  Pt will demonstrate at least 110 degrees of knee flexion AROM in  order to facilitate improved tolerance to functional movements such as squatting/stairs.  Baseline: see ROM chart above Goal status: INITIAL  3.  Pt will demonstrate global LE MMT of at least 4/5 bilaterally for improved functional strength. Baseline: see MMT chart above Goal status: INITIAL  4.  Pt will be able to perform TUG in less than or equal to  13 sec in order to indicate reduced risk of falling (cutoff score for fall risk 13.5 sec in community dwelling older adults per [facility] et al, 2000)  Baseline: 17.93sec no AD  Goal status: INITIAL    5. Pt will demonstrate appropriate performance of final prescribed HEP in order to facilitate improved self-management of symptoms post-discharge.   Baseline: initial HEP prescribed  Goal status: INITIAL    6. Pt will report at least 50% decrease in overall pain levels in past week in order to facilitate improved tolerance to basic ADLs/mobility.   Baseline: 7-9/10  Goal status: INITIAL     PLAN:  PT FREQUENCY: 2x/week  PT DURATION: 8 weeks  PLANNED INTERVENTIONS: Therapeutic exercises, Therapeutic activity, Neuromuscular re-education, Balance training, Gait training, Patient/Family education, Self Care, Joint mobilization, Stair training, Aquatic Therapy, Dry Needling, Cryotherapy, Moist heat, Taping, Manual therapy, and Re-evaluation  PLAN FOR NEXT SESSION: Review/update HEP PRN. Work on [employer] exercises as appropriate with emphasis on open and closed chain LE strengthening, quad activation, and knee mobility. Symptom modification strategies as indicated/appropriate.    Ashley Murrain PT, DPT 12/23/2022 5:26 PM    Referring diagnosis? M25.561,G89.29 (ICD-10-CM) - Chronic pain of right knee (referral comment: eval and treat bilateral knees) Treatment diagnosis? (if different than referring diagnosis) Chronic pain of both knees  Muscle weakness (generalized) What was this (referring dx) caused by? []  Surgery []  Fall [x]   Ongoing issue [x]  Arthritis []  Other: ____________  Laterality: []  Rt []  Lt [x]  Both  Check all possible CPT codes:  *CHOOSE 10 OR LESS*    []  97110 (Therapeutic Exercise)  []  92507 (SLP Treatment)  []  97112 (Neuro Re-ed)   []  92526 (Swallowing Treatment)   []  97116 (Gait Training)   []  [id number] (Cognitive Training, 1st 15 minutes) []  97140 (Manual Therapy)   []  97130 (Cognitive Training, each add'l 15 minutes)  []  97164 (Re-evaluation)                              []  Other, List CPT Code ____________  []  16109 (Therapeutic Activities)     []  97535 (Self Care)   [x]  All codes above (97110 - 97535)  []  97012 (Mechanical Traction)  []  97014 (E-stim Unattended)  []  97032 (E-stim manual)  []  97033 (Ionto)  []  97035 (Ultrasound) []  97750 (Physical Performance Training) [x]  [id number] (Aquatic Therapy) []  97016 (Vasopneumatic Device) []  [id number] (Paraffin) []  97034 (Contrast Bath) []  97597 (Wound Care 1st 20 sq cm) []  97598 (Wound Care each add'l 20 sq cm) []  97760 (Orthotic Fabrication, Fitting, Training Initial) []  [id number] (Prosthetic Management and Training Initial) []  [id number] (Orthotic or Prosthetic Training/ Modification Subsequent)

## 2022-12-23 ENCOUNTER — Other Ambulatory Visit: Payer: Self-pay

## 2022-12-23 ENCOUNTER — Encounter: Payer: Self-pay | Admitting: Physical Therapy

## 2022-12-23 ENCOUNTER — Ambulatory Visit: Payer: Medicare HMO | Attending: Physician Assistant | Admitting: Physical Therapy

## 2022-12-23 DIAGNOSIS — M6281 Muscle weakness (generalized): Secondary | ICD-10-CM | POA: Diagnosis not present

## 2022-12-23 DIAGNOSIS — G8929 Other chronic pain: Secondary | ICD-10-CM | POA: Insufficient documentation

## 2022-12-23 DIAGNOSIS — M25561 Pain in right knee: Secondary | ICD-10-CM | POA: Diagnosis not present

## 2022-12-23 DIAGNOSIS — M25562 Pain in left knee: Secondary | ICD-10-CM | POA: Diagnosis not present

## 2022-12-29 ENCOUNTER — Encounter: Payer: Self-pay | Admitting: Physical Therapy

## 2022-12-29 ENCOUNTER — Ambulatory Visit: Payer: Medicare HMO | Admitting: Physical Therapy

## 2022-12-29 DIAGNOSIS — M25561 Pain in right knee: Secondary | ICD-10-CM | POA: Diagnosis not present

## 2022-12-29 DIAGNOSIS — M6281 Muscle weakness (generalized): Secondary | ICD-10-CM | POA: Diagnosis not present

## 2022-12-29 DIAGNOSIS — G8929 Other chronic pain: Secondary | ICD-10-CM

## 2022-12-29 DIAGNOSIS — M25562 Pain in left knee: Secondary | ICD-10-CM | POA: Diagnosis not present

## 2022-12-29 NOTE — Therapy (Signed)
OUTPATIENT PHYSICAL THERAPY TREATMENT NOTE   Patient Name: SINIAH DAMICO MRN: [id number] DOB:09/01/1960, 62 y.o., female Today's Date: 12/29/2022  END OF SESSION:  PT End of Session - 12/29/22 1314     Visit Number 2    Number of Visits 17    Date for PT Re-Evaluation 02/17/23    Authorization Type [employer]    Authorization Time Period 12 visits 12/27/22-02/11/23    PT Start Time 1314    PT Stop Time 1358    PT Time Calculation (min) 44 min    Activity Tolerance Patient tolerated treatment well    Behavior During Therapy WFL for tasks assessed/performed              Past Medical History:  Diagnosis Date   Acid reflux    Arthritis    Back pain    Depression    Diabetes mellitus without complication (HCC)    Edema, lower extremity    Glaucoma    High cholesterol    Hypertension    Joint pain    Nerve damage    Shortness of breath    Vitamin D deficiency    Past Surgical History:  Procedure Laterality Date   btl     CHOLECYSTECTOMY  2001   COLONOSCOPY WITH PROPOFOL N/A 01/06/2021   Procedure: COLONOSCOPY WITH PROPOFOL;  Surgeon: Benancio Deeds, MD;  Location: [facility];  Service: Gastroenterology;  Laterality: N/A;   POLYPECTOMY  01/06/2021   Procedure: POLYPECTOMY;  Surgeon: Benancio Deeds, MD;  Location: [facility];  Service: Gastroenterology;;   Patient Active Problem List   Diagnosis Date Noted   Morbid obesity (HCC)-start bmi 51.47 05/03/2022   OSA (obstructive sleep apnea) 04/01/2022   Type 2 diabetes mellitus with obesity (HCC) 08/26/2021   At risk for heart disease 05/25/2021   Benign neoplasm of colon    Depression 06/02/2020   Other chronic pain 05/15/2020   SOB (shortness of breath) 04/05/2020   Hyperlipidemia associated with type 2 diabetes mellitus (HCC) 02/04/2020   Vitamin D deficiency 01/16/2020   Encounter for screening colonoscopy 01/16/2020   Major depressive disorder, recurrent episode, mild (HCC) 01/12/2020    Hypertension associated with type 2 diabetes mellitus (HCC) 01/02/2020   BMI 50.0-59.9, adult (HCC) 05/21/2019   Chronic pain of left knee 05/21/2019   Chronic pain of both knees 05/21/2019   Unilateral primary osteoarthritis, left knee 05/21/2019   Unilateral primary osteoarthritis, right knee 05/21/2019   ACROCHORDON 11/12/2009   PERIPHERAL EDEMA 08/01/2009   Pain in joint, lower leg 07/07/2009   SINUS TACHYCARDIA 04/09/2009   BACK PAIN, LUMBOSACRAL, CHRONIC 06/17/2008   SHOULDER PAIN, RIGHT, CHRONIC 05/21/2008   PERIPHERAL NEUROPATHY, MILD 03/05/2008   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   Diabetes mellitus (HCC) 05/19/2006   HYPERCHOLESTEROLEMIA 05/19/2006   OBESITY, NOS 05/19/2006   DEPRESSIVE DISORDER, NOS 05/19/2006   HYPERTENSION, BENIGN SYSTEMIC 05/19/2006   RHINITIS, ALLERGIC 05/19/2006   GASTROESOPHAGEAL REFLUX, NO ESOPHAGITIS 05/19/2006    PCP: Thana Ates, MD  REFERRING PROVIDER: Persons, [place], [place]  REFERRING DIAG: [phone] (ICD-10-CM) - Chronic pain of right knee (referral comment: eval and treat bilateral knees)  THERAPY DIAG:  Chronic pain of both knees  Muscle weakness (generalized)  Rationale for Evaluation and Treatment: Rehabilitation  ONSET DATE: a few years  SUBJECTIVE:  Per eval - Endorses gradual onset of pain over past few years, no MOI. Slowly worsening. States she was told she has arthritis. States her L leg in particular  will give out when fatigued, although this has improved.  Walks with her daughter 15 min in the morning and in the evening.   SUBJECTIVE STATEMENT: 12/29/2022 Pt arrives w/o pain, states it seems like HEP and walking has been helpful. Has continued walking with her daughter. No other new updates   PERTINENT HISTORY: DM, HTN, SOB, depression, chronic pain multi-site PAIN:  Are you having pain: 0/10  Per eval -  Location/description: L>R, medial and lateral joint lines, deep; feels like grinding Best-worst over  past week: 7-9/10  - aggravating factors: stairs, walking >66min, getting down on floor - Easing factors: heated blanket, movement, medication  PRECAUTIONS: None  WEIGHT BEARING RESTRICTIONS: No  FALLS:  Has patient fallen in last 6 months? No  LIVING ENVIRONMENT: 1 level house, 5-6 STE from front with one rail on L, 2STE from back  Lives alone, gets help with yardwork. Pt independent with housework  OCCUPATION: used to work for [employer] - not working since 4-5 years ago due to reported health issues   PLOF: Independent  PATIENT GOALS: wants to be able to exercise to lose weight  NEXT MD VISIT: TBD   OBJECTIVE: (objective measures completed at initial evaluation unless otherwise dated)   DIAGNOSTIC FINDINGS:  11/15/22 R knee XR indicative of tricompartmental arthritis, no acute fracture (per EPIC review)  08/26/22 L knee XR (refer to EPIC for details) Results narrative: "Radiographs of her left knee were reviewed today.  She does have varus  alignment with tricompartmental arthritis most notably in the medial  compartment with periarticular osteophytes loss of joint space and  sclerotic changes no acute fractures "  PATIENT SURVEYS:  FOTO 50 current, 58 predicted  COGNITION: Overall cognitive status: Within functional limits for tasks assessed     SENSATION: Grossly intact BIL   EDEMA:  No appreciable swelling on observation, obscured by pt attire - she denies swelling issues   PALPATION: Concordant generalized tenderness about knee joints   LOWER EXTREMITY ROM:     Active  Right eval Left eval  Hip flexion    Hip extension    Hip internal rotation    Hip external rotation    Knee extension Full * Full *  Knee flexion Seated: 96 * Seated: 90 deg *  (Blank rows = not tested) (Key: WFL = within functional limits not formally assessed, * = concordant pain, s = stiffness/stretching sensation, NT = not tested)  Comments:    LOWER EXTREMITY MMT:     MMT Right eval Left eval  Hip flexion 4 * 4*  Hip abduction (modified sitting) 4+ 4+  Hip internal rotation    Hip external rotation    Knee flexion 4 * 3+ *  Knee extension 4 * 3+ *  Ankle dorsiflexion     (Blank rows = not tested) (Key: WFL = within functional limits not formally assessed, * = concordant pain, s = stiffness/stretching sensation, NT = not tested)  Comments:     LOWER EXTREMITY SPECIAL TESTS:  deferred  FUNCTIONAL TESTS:  TUG: 17.93sec no AD, inc knee pain  GAIT: Distance walked: within clinic Assistive device utilized: None Level of assistance: Complete Independence Comments: reduced gait speed/cadence, widened BOS, reduced trunk rotation, reduced step length   TODAY'S TREATMENT:  [facility] Adult PT Treatment:                                                DATE: 12/29/22 Therapeutic Exercise: LAQ 2x8 BIL  Seated heel slide w/ towel, 2x8 BIL  Standing heel raise at counter 2x12 cues for trunk posture  SLR 3x5 BIL cues for full quad contraction HEP update + education, relevant anatomy/physiology as it pertains to exercise/symptoms and rationale for interventions  Therapeutic Activity: 4 inch step up BIL x8 at counter for UE support, cues for mechanics, weight shifting, and increased incorporation of quad 2inch step up w red band TKE x12 BIL at counter; working on incorporating more quad into functional mechanics STS 2x5 from slightly raised mat; cues for mechanics and pacing    [facility] Adult PT Treatment:                                                DATE: 12/23/22 Therapeutic Exercise: LAQ x8 BIL Heel slide x8 BIL, seated w/ towel HEP handout + education    PATIENT EDUCATION:  Education details: rationale for interventions, HEP  Person educated: Patient Education method: Explanation, Demonstration, Tactile cues, Verbal cues,  and Handouts Education comprehension: verbalized understanding, returned demonstration, verbal cues required, tactile cues required, and needs further education    HOME EXERCISE PROGRAM: Access Code: [email or web address] URL: [email or web address] Date: 12/29/2022 Prepared by: Fransisco Hertz  Exercises - Seated Long Arc Quad  - 2-3 x daily - 7 x weekly - 1 sets - 8 reps - Seated Heel Slide  - 2-3 x daily - 7 x weekly - 1 sets - 8 reps - Sit to Stand with Armchair  - 2-3 x daily - 7 x weekly - 1 sets - 5 reps - Small Range Straight Leg Raise  - 2-3 x daily - 7 x weekly - 1 sets - 5 reps  ASSESSMENT:  CLINICAL IMPRESSION: 12/29/2022 Pt arrives w/o pain, notes improvement with HEP and has been walking more. Today pt does well with HEP review, min cues. Following this with expansion of program working on open and closed chain quad strength with emphasis on functional movement patterns. Pt tolerates well overall but does require rest breaks due to exertional fatigue, education throughout on relevant anatomy/physiology and improving mechanics. Continues to demonstrate reduced knee mobility and strength bilaterally, demonstrates quad avoidance with step ups and today we spend a good amount of time attempting to improve with education/cueing.  Reports mild increase in L knee pain on departure but overall "feels pretty good"; no adverse events. HEP update as above. Recommend continuing along current POC in order to address relevant deficits and improve functional tolerance. Pt departs today's session in no acute distress, all voiced questions/concerns addressed appropriately from PT perspective.    Per eval - Patient is a very pleasant 62 y.o. woman who was seen today for physical therapy evaluation and treatment for chronic knee pain. Pt endorses ongoing difficulties with WB activities, knee mobility, and stair navigation/walking. On exam, pt demonstrates limitations in BIL knee mobility and strength, L  more so than R. TUG time indicative of fall risk (see goal below). Does well with HEP/exam, mild symptom irritability but no increase in resting pain,  no adverse events. Recommend trial of skilled PT to address aforementioned deficits with aim of improving functional tolerance and reducing pain with typical activities. Pt departs today's session in no acute distress, all voiced concerns/questions addressed appropriately from PT perspective.     OBJECTIVE IMPAIRMENTS: Abnormal gait, decreased activity tolerance, decreased endurance, decreased mobility, difficulty walking, decreased ROM, decreased strength, impaired perceived functional ability, improper body mechanics, and pain.   ACTIVITY LIMITATIONS: carrying, lifting, bending, standing, squatting, stairs, transfers, and locomotion level  PARTICIPATION LIMITATIONS: meal prep, cleaning, laundry, and community activity  PERSONAL FACTORS: Age, Time since onset of injury/illness/exacerbation, and 3+ comorbidities: DM, HTN, depression  are also affecting patient's functional outcome.   REHAB POTENTIAL: Fair given chronicity/comorbidities  CLINICAL DECISION MAKING: Stable/uncomplicated  EVALUATION COMPLEXITY: Low   GOALS: Goals reviewed with patient? Yes  SHORT TERM GOALS: Target date: 01/20/2023 Pt will demonstrate appropriate understanding and performance of initially prescribed HEP in order to facilitate improved independence with management of symptoms.  Baseline: HEP provided on eval Goal status: INITIAL   2. Pt will score greater than or equal to 54 on FOTO in order to demonstrate improved perception of function due to symptoms.  Baseline: 50  Goal status: INITIAL    LONG TERM GOALS: Target date: 02/17/2023 Pt will score 58 or greater on FOTO in order to demonstrate improved perception of function due to symptoms.  Baseline: 50 Goal status: INITIAL  2.  Pt will demonstrate at least 110 degrees of knee flexion AROM in order to  facilitate improved tolerance to functional movements such as squatting/stairs.  Baseline: see ROM chart above Goal status: INITIAL  3.  Pt will demonstrate global LE MMT of at least 4/5 bilaterally for improved functional strength. Baseline: see MMT chart above Goal status: INITIAL  4.  Pt will be able to perform TUG in less than or equal to 13 sec in order to indicate reduced risk of falling (cutoff score for fall risk 13.5 sec in community dwelling older adults per [facility] et al, 2000)  Baseline: 17.93sec no AD  Goal status: INITIAL    5. Pt will demonstrate appropriate performance of final prescribed HEP in order to facilitate improved self-management of symptoms post-discharge.   Baseline: initial HEP prescribed  Goal status: INITIAL    6. Pt will report at least 50% decrease in overall pain levels in past week in order to facilitate improved tolerance to basic ADLs/mobility.   Baseline: 7-9/10  Goal status: INITIAL     PLAN:  PT FREQUENCY: 2x/week  PT DURATION: 8 weeks  PLANNED INTERVENTIONS: Therapeutic exercises, Therapeutic activity, Neuromuscular re-education, Balance training, Gait training, Patient/Family education, Self Care, Joint mobilization, Stair training, Aquatic Therapy, Dry Needling, Cryotherapy, Moist heat, Taping, Manual therapy, and Re-evaluation  PLAN FOR NEXT SESSION: Review/update HEP PRN. Work on [employer] exercises as appropriate with emphasis on open and closed chain LE strengthening, quad activation, and knee mobility. Symptom modification strategies as indicated/appropriate.      Ashley Murrain PT, DPT 12/29/2022 2:04 PM

## 2023-01-04 ENCOUNTER — Ambulatory Visit: Payer: Medicare HMO | Admitting: Physical Therapy

## 2023-01-06 ENCOUNTER — Ambulatory Visit: Payer: Medicare HMO | Admitting: Physical Therapy

## 2023-01-06 ENCOUNTER — Encounter: Payer: Self-pay | Admitting: Physical Therapy

## 2023-01-06 DIAGNOSIS — M6281 Muscle weakness (generalized): Secondary | ICD-10-CM | POA: Diagnosis not present

## 2023-01-06 DIAGNOSIS — G8929 Other chronic pain: Secondary | ICD-10-CM

## 2023-01-06 DIAGNOSIS — M25561 Pain in right knee: Secondary | ICD-10-CM | POA: Diagnosis not present

## 2023-01-06 DIAGNOSIS — M25562 Pain in left knee: Secondary | ICD-10-CM | POA: Diagnosis not present

## 2023-01-06 NOTE — Therapy (Signed)
OUTPATIENT PHYSICAL THERAPY TREATMENT NOTE   Patient Name: [name] MRN: [id number] DOB:10-Jun-1960, 63 y.o., female Today's Date: 01/06/2023  END OF SESSION:  PT End of Session - 01/06/23 1404     Visit Number 3    Number of Visits 17    Date for PT Re-Evaluation 02/17/23    Authorization Type [employer]    Authorization Time Period 12 visits 12/27/22-02/11/23    Authorization - Visit Number 2    Authorization - Number of Visits 12    PT Start Time 1417    PT Stop Time 1500    PT Time Calculation (min) 43 min    Activity Tolerance Patient tolerated treatment well    Behavior During Therapy WFL for tasks assessed/performed               Past Medical History:  Diagnosis Date   Acid reflux    Arthritis    Back pain    Depression    Diabetes mellitus without complication (HCC)    Edema, lower extremity    Glaucoma    High cholesterol    Hypertension    Joint pain    Nerve damage    Shortness of breath    Vitamin D deficiency    Past Surgical History:  Procedure Laterality Date   btl     CHOLECYSTECTOMY  2001   COLONOSCOPY WITH PROPOFOL N/A 01/06/2021   Procedure: COLONOSCOPY WITH PROPOFOL;  Surgeon: Benancio Deeds, MD;  Location: [facility];  Service: Gastroenterology;  Laterality: N/A;   POLYPECTOMY  01/06/2021   Procedure: POLYPECTOMY;  Surgeon: Benancio Deeds, MD;  Location: [facility];  Service: Gastroenterology;;   Patient Active Problem List   Diagnosis Date Noted   Morbid obesity (HCC)-start bmi 51.47 05/03/2022   OSA (obstructive sleep apnea) 04/01/2022   Type 2 diabetes mellitus with obesity (HCC) 08/26/2021   At risk for heart disease 05/25/2021   Benign neoplasm of colon    Depression 06/02/2020   Other chronic pain 05/15/2020   SOB (shortness of breath) 04/05/2020   Hyperlipidemia associated with type 2 diabetes mellitus (HCC) 02/04/2020   Vitamin D deficiency 01/16/2020   Encounter for screening colonoscopy  01/16/2020   Major depressive disorder, recurrent episode, mild (HCC) 01/12/2020   Hypertension associated with type 2 diabetes mellitus (HCC) 01/02/2020   BMI 50.0-59.9, adult (HCC) 05/21/2019   Chronic pain of left knee 05/21/2019   Chronic pain of both knees 05/21/2019   Unilateral primary osteoarthritis, left knee 05/21/2019   Unilateral primary osteoarthritis, right knee 05/21/2019   ACROCHORDON 11/12/2009   PERIPHERAL EDEMA 08/01/2009   Pain in joint, lower leg 07/07/2009   SINUS TACHYCARDIA 04/09/2009   BACK PAIN, LUMBOSACRAL, CHRONIC 06/17/2008   SHOULDER PAIN, RIGHT, CHRONIC 05/21/2008   PERIPHERAL NEUROPATHY, MILD 03/05/2008   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   Diabetes mellitus (HCC) 05/19/2006   HYPERCHOLESTEROLEMIA 05/19/2006   OBESITY, NOS 05/19/2006   DEPRESSIVE DISORDER, NOS 05/19/2006   HYPERTENSION, BENIGN SYSTEMIC 05/19/2006   RHINITIS, ALLERGIC 05/19/2006   GASTROESOPHAGEAL REFLUX, NO ESOPHAGITIS 05/19/2006    PCP: Thana Ates, MD  REFERRING PROVIDER: Persons, [place], [place]  REFERRING DIAG: [phone] (ICD-10-CM) - Chronic pain of right knee (referral comment: eval and treat bilateral knees)  THERAPY DIAG:  Chronic pain of both knees  Muscle weakness (generalized)  Rationale for Evaluation and Treatment: Rehabilitation  ONSET DATE: a few years  SUBJECTIVE:  Per eval - Endorses gradual onset of pain over past few  years, no MOI. Slowly worsening. States she was told she has arthritis. States her L leg in particular will give out when fatigued, although this has improved.  Walks with her daughter 15 min in the morning and in the evening.   SUBJECTIVE STATEMENT: 01/06/2023  I do 15 minutes in the morning and afternoon every day except Sunday. Left knee  9/10 pain and I took my medicine.     PERTINENT HISTORY: DM, HTN, SOB, depression, chronic pain multi-site PAIN:  Are you having pain: 0/10  Per eval -  Location/description: L>R, medial and  lateral joint lines, deep; feels like grinding Best-worst over past week: 7-9/10  - aggravating factors: stairs, walking >81min, getting down on floor - Easing factors: heated blanket, movement, medication  PRECAUTIONS: None  WEIGHT BEARING RESTRICTIONS: No  FALLS:  Has patient fallen in last 6 months? No  LIVING ENVIRONMENT: 1 level house, 5-6 STE from front with one rail on L, 2STE from back  Lives alone, gets help with yardwork. Pt independent with housework  OCCUPATION: used to work for [employer] - not working since 4-5 years ago due to reported health issues   PLOF: Independent  PATIENT GOALS: wants to be able to exercise to lose weight  NEXT MD VISIT: TBD   OBJECTIVE: (objective measures completed at initial evaluation unless otherwise dated)   DIAGNOSTIC FINDINGS:  11/15/22 R knee XR indicative of tricompartmental arthritis, no acute fracture (per EPIC review)  08/26/22 L knee XR (refer to EPIC for details) Results narrative: "Radiographs of her left knee were reviewed today.  She does have varus  alignment with tricompartmental arthritis most notably in the medial  compartment with periarticular osteophytes loss of joint space and  sclerotic changes no acute fractures "  PATIENT SURVEYS:  FOTO 50 current, 58 predicted  COGNITION: Overall cognitive status: Within functional limits for tasks assessed     SENSATION: Grossly intact BIL   EDEMA:  No appreciable swelling on observation, obscured by pt attire - she denies swelling issues   PALPATION: Concordant generalized tenderness about knee joints   LOWER EXTREMITY ROM:     Active  Right eval Left eval  Hip flexion    Hip extension    Hip internal rotation    Hip external rotation    Knee extension Full * Full *  Knee flexion Seated: 96 * Seated: 90 deg *  (Blank rows = not tested) (Key: WFL = within functional limits not formally assessed, * = concordant pain, s = stiffness/stretching  sensation, NT = not tested)  Comments:    LOWER EXTREMITY MMT:    MMT Right eval Left eval  Hip flexion 4 * 4*  Hip abduction (modified sitting) 4+ 4+  Hip internal rotation    Hip external rotation    Knee flexion 4 * 3+ *  Knee extension 4 * 3+ *  Ankle dorsiflexion     (Blank rows = not tested) (Key: WFL = within functional limits not formally assessed, * = concordant pain, s = stiffness/stretching sensation, NT = not tested)  Comments:     LOWER EXTREMITY SPECIAL TESTS:  deferred  FUNCTIONAL TESTS:  TUG: 17.93sec no AD, inc knee pain  GAIT: Distance walked: within clinic Assistive device utilized: None Level of assistance: Complete Independence Comments: reduced gait speed/cadence, widened BOS, reduced trunk rotation, reduced step length   TODAY'S TREATMENT:  [facility] Adult PT Treatment:  DATE: 01-06-23 Therapeutic Exercise: Recumbent bike for 3  minutes but Left knee painful so stopped  LAQ 1 x 8 BIL LAQ 2 x 8 with 2 lb cuff weight Seated hamstring stretch (Kiss the grandma cue) Left knee  30 sec x 2 Seated heel slide w/ towel, 2x8 BIL  Standing heel raise at counter 2x12 cues for trunk posture  SLR 3x5 BIL cues for full quad contraction HEP Update for hamstring stretch Manual Therapy: KT tape for left knee  2 I strips and 1 compression strip over tibial tuberosity Manual STW over quad and hamstring of left LE                                                                                                                             [facility] Adult PT Treatment:                                                DATE: 12/29/22 Therapeutic Exercise: LAQ 2x8 BIL  Seated heel slide w/ towel, 2x8 BIL  Standing heel raise at counter 2x12 cues for trunk posture  SLR 3x5 BIL cues for full quad contraction HEP update + education, relevant anatomy/physiology as it pertains to exercise/symptoms and rationale for  interventions  Therapeutic Activity: 4 inch step up BIL x8 at counter for UE support, cues for mechanics, weight shifting, and increased incorporation of quad 2inch step up w red band TKE x12 BIL at counter; working on incorporating more quad into functional mechanics STS 2x5 from slightly raised mat; cues for mechanics and pacing    [facility] Adult PT Treatment:                                                DATE: 12/23/22 Therapeutic Exercise: LAQ x8 BIL Heel slide x8 BIL, seated w/ towel HEP handout + education    PATIENT EDUCATION:  Education details: rationale for interventions, HEP  Person educated: Patient Education method: Explanation, Demonstration, Tactile cues, Verbal cues, and Handouts Education comprehension: verbalized understanding, returned demonstration, verbal cues required, tactile cues required, and needs further education    HOME EXERCISE PROGRAM: Access Code: [email or web address] URL: [email or web address] Date: 12/29/2022 Prepared by: Fransisco Hertz  Exercises - Seated Long Arc Quad  - 2-3 x daily - 7 x weekly - 1 sets - 8 reps - Seated Heel Slide  - 2-3 x daily - 7 x weekly - 1 sets - 8 reps - Sit to Stand with Armchair  - 2-3 x daily - 7 x weekly - 1 sets - 5 reps - Small Range Straight Leg Raise  - 2-3 x daily - 7 x weekly - 1 sets - 5 reps  ASSESSMENT:  CLINICAL IMPRESSION: 01/06/2023 Pt arrives 9/10  pain in left knee and PT performs some manual and kt taping before trying recumbent bike for a shot time.  Pt then with hamstring stretch and exercises and notes decreased in tension and pain to 3/10. Recommend continuing along current POC in order to address relevant deficits and improve functional tolerance. Pt departs today's session in no acute distress, all voiced questions/concerns addressed appropriately from PT perspective.    Per eval - Patient is a very pleasant 62 y.o. woman who was seen today for physical therapy evaluation and treatment for  chronic knee pain. Pt endorses ongoing difficulties with WB activities, knee mobility, and stair navigation/walking. On exam, pt demonstrates limitations in BIL knee mobility and strength, L more so than R. TUG time indicative of fall risk (see goal below). Does well with HEP/exam, mild symptom irritability but no increase in resting pain, no adverse events. Recommend trial of skilled PT to address aforementioned deficits with aim of improving functional tolerance and reducing pain with typical activities. Pt departs today's session in no acute distress, all voiced concerns/questions addressed appropriately from PT perspective.     OBJECTIVE IMPAIRMENTS: Abnormal gait, decreased activity tolerance, decreased endurance, decreased mobility, difficulty walking, decreased ROM, decreased strength, impaired perceived functional ability, improper body mechanics, and pain.   ACTIVITY LIMITATIONS: carrying, lifting, bending, standing, squatting, stairs, transfers, and locomotion level  PARTICIPATION LIMITATIONS: meal prep, cleaning, laundry, and community activity  PERSONAL FACTORS: Age, Time since onset of injury/illness/exacerbation, and 3+ comorbidities: DM, HTN, depression  are also affecting patient's functional outcome.   REHAB POTENTIAL: Fair given chronicity/comorbidities  CLINICAL DECISION MAKING: Stable/uncomplicated  EVALUATION COMPLEXITY: Low   GOALS: Goals reviewed with patient? Yes  SHORT TERM GOALS: Target date: 01/20/2023 Pt will demonstrate appropriate understanding and performance of initially prescribed HEP in order to facilitate improved independence with management of symptoms.  Baseline: HEP provided on eval Goal status: INITIAL   2. Pt will score greater than or equal to 54 on FOTO in order to demonstrate improved perception of function due to symptoms.  Baseline: 50  Goal status: INITIAL    LONG TERM GOALS: Target date: 02/17/2023 Pt will score 58 or greater on FOTO in  order to demonstrate improved perception of function due to symptoms.  Baseline: 50 Goal status: INITIAL  2.  Pt will demonstrate at least 110 degrees of knee flexion AROM in order to facilitate improved tolerance to functional movements such as squatting/stairs.  Baseline: see ROM chart above Goal status: INITIAL  3.  Pt will demonstrate global LE MMT of at least 4/5 bilaterally for improved functional strength. Baseline: see MMT chart above Goal status: INITIAL  4.  Pt will be able to perform TUG in less than or equal to 13 sec in order to indicate reduced risk of falling (cutoff score for fall risk 13.5 sec in community dwelling older adults per [facility] et al, 2000)  Baseline: 17.93sec no AD  Goal status: INITIAL    5. Pt will demonstrate appropriate performance of final prescribed HEP in order to facilitate improved self-management of symptoms post-discharge.   Baseline: initial HEP prescribed  Goal status: INITIAL    6. Pt will report at least 50% decrease in overall pain levels in past week in order to facilitate improved tolerance to basic ADLs/mobility.   Baseline: 7-9/10  Goal status: INITIAL     PLAN:  PT FREQUENCY: 2x/week  PT DURATION: 8 weeks  PLANNED INTERVENTIONS: Therapeutic exercises, Therapeutic activity, Neuromuscular  re-education, Balance training, Gait training, Patient/Family education, Self Care, Joint mobilization, Stair training, Aquatic Therapy, Dry Needling, Cryotherapy, Moist heat, Taping, Manual therapy, and Re-evaluation  PLAN FOR NEXT SESSION: Review/update HEP PRN. Work on [employer] exercises as appropriate with emphasis on open and closed chain LE strengthening, quad activation, and knee mobility. Symptom modification strategies as indicated/appropriate.      Garen Lah, PT, ATRIC Certified Exercise Expert for the Aging Adult  01/06/23 3:04 PM Phone: [phone] Fax: [phone]

## 2023-01-07 DIAGNOSIS — E1142 Type 2 diabetes mellitus with diabetic polyneuropathy: Secondary | ICD-10-CM | POA: Diagnosis not present

## 2023-01-07 LAB — LAB REPORT - SCANNED
Creatinine, POC: 150 mg/dL
Microalb Creat Ratio: 38.7
Microalbumin, Urine: 5.79

## 2023-01-11 ENCOUNTER — Encounter: Payer: Self-pay | Admitting: Physical Therapy

## 2023-01-11 ENCOUNTER — Ambulatory Visit: Payer: Medicare HMO | Admitting: Physical Therapy

## 2023-01-11 DIAGNOSIS — M25562 Pain in left knee: Secondary | ICD-10-CM | POA: Diagnosis not present

## 2023-01-11 DIAGNOSIS — M25561 Pain in right knee: Secondary | ICD-10-CM | POA: Diagnosis not present

## 2023-01-11 DIAGNOSIS — M6281 Muscle weakness (generalized): Secondary | ICD-10-CM | POA: Diagnosis not present

## 2023-01-11 DIAGNOSIS — G8929 Other chronic pain: Secondary | ICD-10-CM | POA: Diagnosis not present

## 2023-01-11 NOTE — Therapy (Incomplete)
OUTPATIENT PHYSICAL THERAPY TREATMENT NOTE   Patient Name: [name] MRN: [id number] DOB:[birth year], 62 y.o., female Today's Date: 01/11/2023  END OF SESSION:       Past Medical History:  Diagnosis Date   Acid reflux    Arthritis    Back pain    Depression    Diabetes mellitus without complication (HCC)    Edema, lower extremity    Glaucoma    High cholesterol    Hypertension    Joint pain    Nerve damage    Shortness of breath    Vitamin D deficiency    Past Surgical History:  Procedure Laterality Date   btl     CHOLECYSTECTOMY  2001   COLONOSCOPY WITH PROPOFOL N/A 01/06/2021   Procedure: COLONOSCOPY WITH PROPOFOL;  Surgeon: Benancio Deeds, MD;  Location: [facility];  Service: Gastroenterology;  Laterality: N/A;   POLYPECTOMY  01/06/2021   Procedure: POLYPECTOMY;  Surgeon: Benancio Deeds, MD;  Location: [facility];  Service: Gastroenterology;;   Patient Active Problem List   Diagnosis Date Noted   Morbid obesity (HCC)-start bmi 51.47 05/03/2022   OSA (obstructive sleep apnea) 04/01/2022   Type 2 diabetes mellitus with obesity (HCC) 08/26/2021   At risk for heart disease 05/25/2021   Benign neoplasm of colon    Depression 06/02/2020   Other chronic pain 05/15/2020   SOB (shortness of breath) 04/05/2020   Hyperlipidemia associated with type 2 diabetes mellitus (HCC) 02/04/2020   Vitamin D deficiency 01/16/2020   Encounter for screening colonoscopy 01/16/2020   Major depressive disorder, recurrent episode, mild (HCC) 01/12/2020   Hypertension associated with type 2 diabetes mellitus (HCC) 01/02/2020   BMI 50.0-59.9, adult (HCC) 05/21/2019   Chronic pain of left knee 05/21/2019   Chronic pain of both knees 05/21/2019   Unilateral primary osteoarthritis, left knee 05/21/2019   Unilateral primary osteoarthritis, right knee 05/21/2019   ACROCHORDON 11/12/2009   PERIPHERAL EDEMA 08/01/2009   Pain in joint, lower leg 07/07/2009   SINUS  TACHYCARDIA 04/09/2009   BACK PAIN, LUMBOSACRAL, CHRONIC 06/17/2008   SHOULDER PAIN, RIGHT, CHRONIC 05/21/2008   PERIPHERAL NEUROPATHY, MILD 03/05/2008   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   Diabetes mellitus (HCC) 05/19/2006   HYPERCHOLESTEROLEMIA 05/19/2006   OBESITY, NOS 05/19/2006   DEPRESSIVE DISORDER, NOS 05/19/2006   HYPERTENSION, BENIGN SYSTEMIC 05/19/2006   RHINITIS, ALLERGIC 05/19/2006   GASTROESOPHAGEAL REFLUX, NO ESOPHAGITIS 05/19/2006    PCP: Thana Ates, MD  REFERRING PROVIDER: Persons, [place], [place]  REFERRING DIAG: [id number] (ICD-10-CM) - Chronic pain of right knee (referral comment: eval and treat bilateral knees)  THERAPY DIAG:  No diagnosis found.  Rationale for Evaluation and Treatment: Rehabilitation  ONSET DATE: a few years  SUBJECTIVE:  Per eval - Endorses gradual onset of pain over past few years, no MOI. Slowly worsening. States she was told she has arthritis. States her L leg in particular will give out when fatigued, although this has improved.  Walks with her daughter 15 min in the morning and in the evening.   SUBJECTIVE STATEMENT: 01/11/2023   I was doing great with no pain but I was walking yesterday I am now a 7/10. That KT tape really helps me    PERTINENT HISTORY: DM, HTN, SOB, depression, chronic pain multi-site PAIN:  Are you having pain: 0/10  Per eval -  Location/description: L>R, medial and lateral joint lines, deep; feels like grinding Best-worst over past week: 7-9/10  - aggravating factors: stairs, walking >57min, getting  down on floor - Easing factors: heated blanket, movement, medication  PRECAUTIONS: None  WEIGHT BEARING RESTRICTIONS: No  FALLS:  Has patient fallen in last 6 months? No  LIVING ENVIRONMENT: 1 level house, 5-6 STE from front with one rail on L, 2STE from back  Lives alone, gets help with yardwork. Pt independent with housework  OCCUPATION: used to work for [employer] - not working  since 4-5 years ago due to reported health issues   PLOF: Independent  PATIENT GOALS: wants to be able to exercise to lose weight  NEXT MD VISIT: TBD   OBJECTIVE: (objective measures completed at initial evaluation unless otherwise dated)   DIAGNOSTIC FINDINGS:  11/15/22 R knee XR indicative of tricompartmental arthritis, no acute fracture (per EPIC review)  08/26/22 L knee XR (refer to EPIC for details) Results narrative: "Radiographs of her left knee were reviewed today.  She does have varus  alignment with tricompartmental arthritis most notably in the medial  compartment with periarticular osteophytes loss of joint space and  sclerotic changes no acute fractures "  PATIENT SURVEYS:  FOTO 50 current, 58 predicted  COGNITION: Overall cognitive status: Within functional limits for tasks assessed     SENSATION: Grossly intact BIL   EDEMA:  No appreciable swelling on observation, obscured by pt attire - she denies swelling issues   PALPATION: Concordant generalized tenderness about knee joints   LOWER EXTREMITY ROM:     Active  Right eval Left eval  Hip flexion    Hip extension    Hip internal rotation    Hip external rotation    Knee extension Full * Full *  Knee flexion Seated: 96 * Seated: 90 deg *  (Blank rows = not tested) (Key: WFL = within functional limits not formally assessed, * = concordant pain, s = stiffness/stretching sensation, NT = not tested)  Comments:    LOWER EXTREMITY MMT:    MMT Right eval Left eval  Hip flexion 4 * 4*  Hip abduction (modified sitting) 4+ 4+  Hip internal rotation    Hip external rotation    Knee flexion 4 * 3+ *  Knee extension 4 * 3+ *  Ankle dorsiflexion     (Blank rows = not tested) (Key: WFL = within functional limits not formally assessed, * = concordant pain, s = stiffness/stretching sensation, NT = not tested)  Comments:     LOWER EXTREMITY SPECIAL TESTS:  deferred  FUNCTIONAL TESTS:  TUG: 17.93sec no  AD, inc knee pain  GAIT: Distance walked: within clinic Assistive device utilized: None Level of assistance: Complete Independence Comments: reduced gait speed/cadence, widened BOS, reduced trunk rotation, reduced step length   TODAY'S TREATMENT:  [facility] Adult PT Treatment:                                                DATE: 01-13-23 Therapeutic Exercise: *** Manual Therapy: *** Neuromuscular re-ed: *** Therapeutic Activity: *** Modalities: *** Self Care: ***  [facility] Adult PT Treatment:                                                DATE: 11-11-22 Therapeutic Exercise: Tall kneeling- vertical bridge 2 x 10 Step up on  6 inch leading with L LE  2 x 10 Step down on 2 inch L LE remaining on step  2x 10  LAQ 1 x 10 BIL LAQ 2 x  12  with 3 lb cuff weight Heel raise with bil 3 lb weights on  2 x 12 SLR with 3 # cuff weights 2 x 10 each side R hip flexor stretch Seated hamstring stretch (Kiss the grandma cue) Left knee  30 sec x 2 Manual Therapy: KT tape for left knee  2 I strips and 1 compression strip over tibial tuberosity Manual STW over quad and hamstring of left LE  Therapeutic Activity: Transfers from standing to tall kneeling x 4 Transfer from standing to supine on floor x 2 rising using stronger R LE in 1/2 kneeling  use of there ex pad for padding for knees  [facility] Adult PT Treatment:                                                DATE: 01-06-23 Therapeutic Exercise: Recumbent bike for 3  minutes but Left knee painful so stopped  LAQ 1 x 8 BIL LAQ 2 x 8 with 2 lb cuff weight Seated hamstring stretch (Kiss the grandma cue) Left knee  30 sec x 2 Seated heel slide w/ towel, 2x8 BIL  Standing heel raise at counter 2x12 cues for trunk posture  SLR 3x5 BIL cues for full quad contraction HEP Update for hamstring stretch Manual Therapy: KT tape for left knee  2 I strips and 1 compression strip over tibial tuberosity Manual STW over quad and hamstring of left LE                                                                                                                              [facility] Adult PT Treatment:                                                DATE: 12/29/22 Therapeutic Exercise: LAQ 2x8 BIL  Seated heel slide w/ towel, 2x8 BIL  Standing heel raise at counter 2x12 cues for trunk posture  SLR 3x5 BIL cues for full quad contraction HEP update + education, relevant anatomy/physiology as it pertains to exercise/symptoms and rationale for interventions  Therapeutic Activity: 4 inch step up BIL x8 at counter for UE support, cues for mechanics, weight shifting, and increased incorporation of quad 2inch step up w red band TKE x12 BIL at counter; working on incorporating more quad into functional mechanics STS 2x5 from slightly raised mat; cues for mechanics and pacing    [facility] Adult PT Treatment:  DATE: 12/23/22 Therapeutic Exercise: LAQ x8 BIL Heel slide x8 BIL, seated w/ towel HEP handout + education    PATIENT EDUCATION:  Education details: rationale for interventions, HEP  Person educated: Patient Education method: Explanation, Demonstration, Tactile cues, Verbal cues, and Handouts Education comprehension: verbalized understanding, returned demonstration, verbal cues required, tactile cues required, and needs further education    HOME EXERCISE PROGRAM: Access Code: [email or web address] URL: [email or web address] Date: 12/29/2022 Prepared by: Fransisco Hertz  Exercises - Seated Long Arc Quad  - 2-3 x daily - 7 x weekly - 1 sets - 8 reps - Seated Heel Slide  - 2-3 x daily - 7 x weekly - 1 sets - 8 reps - Sit to Stand with Armchair  - 2-3 x daily - 7 x weekly - 1 sets - 5 reps - Small Range Straight Leg Raise  - 2-3 x daily - 7 x weekly - 1 sets - 5 reps Added 01-11-23 - Forward Step Up with Counter Support  - 1 x daily - 7 x weekly - 3 sets - 10 reps - Lateral Step Up with Unilateral Counter  Support  - 1 x daily - 7 x weekly - 3 sets - 10 reps ASSESSMENT:  CLINICAL IMPRESSION: 01/11/2023 Pt arrives 7/10  pain in left knee and PT performs some manual and kt taping before performing standing to floor transfers. And exercises with increased challenge and progressive weights.  Pt then with hamstring stretch and exercises at end and notes decreased in tension and pain to 0/10. Progressed with closed chain forward and lateral step ups.  Recommend continuing along current POC in order to address relevant deficits and improve functional tolerance. Pt departs today's session in no acute distress, all voiced questions/concerns addressed appropriately from PT perspective.    Per eval - Patient is a very pleasant 62 y.o. woman who was seen today for physical therapy evaluation and treatment for chronic knee pain. Pt endorses ongoing difficulties with WB activities, knee mobility, and stair navigation/walking. On exam, pt demonstrates limitations in BIL knee mobility and strength, L more so than R. TUG time indicative of fall risk (see goal below). Does well with HEP/exam, mild symptom irritability but no increase in resting pain, no adverse events. Recommend trial of skilled PT to address aforementioned deficits with aim of improving functional tolerance and reducing pain with typical activities. Pt departs today's session in no acute distress, all voiced concerns/questions addressed appropriately from PT perspective.     OBJECTIVE IMPAIRMENTS: Abnormal gait, decreased activity tolerance, decreased endurance, decreased mobility, difficulty walking, decreased ROM, decreased strength, impaired perceived functional ability, improper body mechanics, and pain.   ACTIVITY LIMITATIONS: carrying, lifting, bending, standing, squatting, stairs, transfers, and locomotion level  PARTICIPATION LIMITATIONS: meal prep, cleaning, laundry, and community activity  PERSONAL FACTORS: Age, Time since onset of  injury/illness/exacerbation, and 3+ comorbidities: DM, HTN, depression  are also affecting patient's functional outcome.   REHAB POTENTIAL: Fair given chronicity/comorbidities  CLINICAL DECISION MAKING: Stable/uncomplicated  EVALUATION COMPLEXITY: Low   GOALS: Goals reviewed with patient? Yes  SHORT TERM GOALS: Target date: 01/20/2023 Pt will demonstrate appropriate understanding and performance of initially prescribed HEP in order to facilitate improved independence with management of symptoms.  Baseline: HEP provided on eval Goal status: MET  2. Pt will score greater than or equal to 54 on FOTO in order to demonstrate improved perception of function due to symptoms.  Baseline: 50  Goal status: INITIAL    LONG TERM GOALS: Target date: 02/17/2023 Pt  will score 58 or greater on FOTO in order to demonstrate improved perception of function due to symptoms.  Baseline: 50 Goal status: INITIAL  2.  Pt will demonstrate at least 110 degrees of knee flexion AROM in order to facilitate improved tolerance to functional movements such as squatting/stairs.  Baseline: see ROM chart above Goal status: INITIAL  3.  Pt will demonstrate global LE MMT of at least 4/5 bilaterally for improved functional strength. Baseline: see MMT chart above Goal status: INITIAL  4.  Pt will be able to perform TUG in less than or equal to 13 sec in order to indicate reduced risk of falling (cutoff score for fall risk 13.5 sec in community dwelling older adults per [facility] et al, 2000)  Baseline: 17.93sec no AD  Goal status: INITIAL    5. Pt will demonstrate appropriate performance of final prescribed HEP in order to facilitate improved self-management of symptoms post-discharge.   Baseline: initial HEP prescribed  Goal status: INITIAL    6. Pt will report at least 50% decrease in overall pain levels in past week in order to facilitate improved tolerance to basic ADLs/mobility.   Baseline: 7-9/10  Goal  status: INITIAL     PLAN:  PT FREQUENCY: 2x/week  PT DURATION: 8 weeks  PLANNED INTERVENTIONS: Therapeutic exercises, Therapeutic activity, Neuromuscular re-education, Balance training, Gait training, Patient/Family education, Self Care, Joint mobilization, Stair training, Aquatic Therapy, Dry Needling, Cryotherapy, Moist heat, Taping, Manual therapy, and Re-evaluation  PLAN FOR NEXT SESSION: Review/update HEP PRN. Work on [employer] exercises as appropriate with emphasis on open and closed chain LE strengthening, quad activation, and knee mobility. Symptom modification strategies as indicated/appropriate.      ***

## 2023-01-11 NOTE — Therapy (Signed)
OUTPATIENT PHYSICAL THERAPY TREATMENT NOTE   Patient Name: HIAWATHA SCHMALTZ MRN: [id number] DOB:[birth year], 62 y.o., female Today's Date: 01/11/2023  END OF SESSION:  PT End of Session - 01/11/23 1419     Visit Number 4    Number of Visits 17    Date for PT Re-Evaluation 02/17/23    Authorization Type [employer]    Authorization Time Period 12 visits 12/27/22-02/11/23    Authorization - Visit Number 3    Authorization - Number of Visits 12    PT Start Time 1417    PT Stop Time 1500    PT Time Calculation (min) 43 min    Activity Tolerance Patient tolerated treatment well    Behavior During Therapy WFL for tasks assessed/performed                Past Medical History:  Diagnosis Date   Acid reflux    Arthritis    Back pain    Depression    Diabetes mellitus without complication (HCC)    Edema, lower extremity    Glaucoma    High cholesterol    Hypertension    Joint pain    Nerve damage    Shortness of breath    Vitamin D deficiency    Past Surgical History:  Procedure Laterality Date   btl     CHOLECYSTECTOMY  2001   COLONOSCOPY WITH PROPOFOL N/A 01/06/2021   Procedure: COLONOSCOPY WITH PROPOFOL;  Surgeon: Benancio Deeds, MD;  Location: [facility];  Service: Gastroenterology;  Laterality: N/A;   POLYPECTOMY  01/06/2021   Procedure: POLYPECTOMY;  Surgeon: Benancio Deeds, MD;  Location: [facility];  Service: Gastroenterology;;   Patient Active Problem List   Diagnosis Date Noted   Morbid obesity (HCC)-start bmi 51.47 05/03/2022   OSA (obstructive sleep apnea) 04/01/2022   Type 2 diabetes mellitus with obesity (HCC) 08/26/2021   At risk for heart disease 05/25/2021   Benign neoplasm of colon    Depression 06/02/2020   Other chronic pain 05/15/2020   SOB (shortness of breath) 04/05/2020   Hyperlipidemia associated with type 2 diabetes mellitus (HCC) 02/04/2020   Vitamin D deficiency 01/16/2020   Encounter for screening colonoscopy  01/16/2020   Major depressive disorder, recurrent episode, mild (HCC) 01/12/2020   Hypertension associated with type 2 diabetes mellitus (HCC) 01/02/2020   BMI 50.0-59.9, adult (HCC) 05/21/2019   Chronic pain of left knee 05/21/2019   Chronic pain of both knees 05/21/2019   Unilateral primary osteoarthritis, left knee 05/21/2019   Unilateral primary osteoarthritis, right knee 05/21/2019   ACROCHORDON 11/12/2009   PERIPHERAL EDEMA 08/01/2009   Pain in joint, lower leg 07/07/2009   SINUS TACHYCARDIA 04/09/2009   BACK PAIN, LUMBOSACRAL, CHRONIC 06/17/2008   SHOULDER PAIN, RIGHT, CHRONIC 05/21/2008   PERIPHERAL NEUROPATHY, MILD 03/05/2008   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   Diabetes mellitus (HCC) 05/19/2006   HYPERCHOLESTEROLEMIA 05/19/2006   OBESITY, NOS 05/19/2006   DEPRESSIVE DISORDER, NOS 05/19/2006   HYPERTENSION, BENIGN SYSTEMIC 05/19/2006   RHINITIS, ALLERGIC 05/19/2006   GASTROESOPHAGEAL REFLUX, NO ESOPHAGITIS 05/19/2006    PCP: Thana Ates, MD  REFERRING PROVIDER: Persons, [place], [place]  REFERRING DIAG: [phone] (ICD-10-CM) - Chronic pain of right knee (referral comment: eval and treat bilateral knees)  THERAPY DIAG:  Chronic pain of both knees  Muscle weakness (generalized)  Rationale for Evaluation and Treatment: Rehabilitation  ONSET DATE: a few years  SUBJECTIVE:  Per eval - Endorses gradual onset of pain over past  few years, no MOI. Slowly worsening. States she was told she has arthritis. States her L leg in particular will give out when fatigued, although this has improved.  Walks with her daughter 15 min in the morning and in the evening.   SUBJECTIVE STATEMENT: 01/11/2023   I was doing great with no pain but I was walking yesterday I am now a 7/10. That KT tape really helps me    PERTINENT HISTORY: DM, HTN, SOB, depression, chronic pain multi-site PAIN:  Are you having pain: 0/10  Per eval -  Location/description: L>R, medial and lateral  joint lines, deep; feels like grinding Best-worst over past week: 7-9/10  - aggravating factors: stairs, walking >45min, getting down on floor - Easing factors: heated blanket, movement, medication  PRECAUTIONS: None  WEIGHT BEARING RESTRICTIONS: No  FALLS:  Has patient fallen in last 6 months? No  LIVING ENVIRONMENT: 1 level house, 5-6 STE from front with one rail on L, 2STE from back  Lives alone, gets help with yardwork. Pt independent with housework  OCCUPATION: used to work for [employer] - not working since 4-5 years ago due to reported health issues   PLOF: Independent  PATIENT GOALS: wants to be able to exercise to lose weight  NEXT MD VISIT: TBD   OBJECTIVE: (objective measures completed at initial evaluation unless otherwise dated)   DIAGNOSTIC FINDINGS:  11/15/22 R knee XR indicative of tricompartmental arthritis, no acute fracture (per EPIC review)  08/26/22 L knee XR (refer to EPIC for details) Results narrative: "Radiographs of her left knee were reviewed today.  She does have varus  alignment with tricompartmental arthritis most notably in the medial  compartment with periarticular osteophytes loss of joint space and  sclerotic changes no acute fractures "  PATIENT SURVEYS:  FOTO 50 current, 58 predicted  COGNITION: Overall cognitive status: Within functional limits for tasks assessed     SENSATION: Grossly intact BIL   EDEMA:  No appreciable swelling on observation, obscured by pt attire - she denies swelling issues   PALPATION: Concordant generalized tenderness about knee joints   LOWER EXTREMITY ROM:     Active  Right eval Left eval  Hip flexion    Hip extension    Hip internal rotation    Hip external rotation    Knee extension Full * Full *  Knee flexion Seated: 96 * Seated: 90 deg *  (Blank rows = not tested) (Key: WFL = within functional limits not formally assessed, * = concordant pain, s = stiffness/stretching sensation,  NT = not tested)  Comments:    LOWER EXTREMITY MMT:    MMT Right eval Left eval  Hip flexion 4 * 4*  Hip abduction (modified sitting) 4+ 4+  Hip internal rotation    Hip external rotation    Knee flexion 4 * 3+ *  Knee extension 4 * 3+ *  Ankle dorsiflexion     (Blank rows = not tested) (Key: WFL = within functional limits not formally assessed, * = concordant pain, s = stiffness/stretching sensation, NT = not tested)  Comments:     LOWER EXTREMITY SPECIAL TESTS:  deferred  FUNCTIONAL TESTS:  TUG: 17.93sec no AD, inc knee pain  GAIT: Distance walked: within clinic Assistive device utilized: None Level of assistance: Complete Independence Comments: reduced gait speed/cadence, widened BOS, reduced trunk rotation, reduced step length   TODAY'S TREATMENT:  [facility] Adult PT Treatment:  DATE: 11-11-22 Therapeutic Exercise: Tall kneeling- vertical bridge 2 x 10 Step up on 6 inch leading with L LE  2 x 10 Step down on 2 inch L LE remaining on step  2x 10  LAQ 1 x 10 BIL LAQ 2 x  12  with 3 lb cuff weight Heel raise with bil 3 lb weights on  2 x 12 SLR with 3 # cuff weights 2 x 10 each side R hip flexor stretch Seated hamstring stretch (Kiss the grandma cue) Left knee  30 sec x 2 Manual Therapy: KT tape for left knee  2 I strips and 1 compression strip over tibial tuberosity Manual STW over quad and hamstring of left LE  Therapeutic Activity: Transfers from standing to tall kneeling x 4 Transfer from standing to supine on floor x 2 rising using stronger R LE in 1/2 kneeling  use of there ex pad for padding for knees  [facility] Adult PT Treatment:                                                DATE: 01-06-23 Therapeutic Exercise: Recumbent bike for 3  minutes but Left knee painful so stopped  LAQ 1 x 8 BIL LAQ 2 x 8 with 2 lb cuff weight Seated hamstring stretch (Kiss the grandma cue) Left knee  30 sec x 2 Seated heel slide w/  towel, 2x8 BIL  Standing heel raise at counter 2x12 cues for trunk posture  SLR 3x5 BIL cues for full quad contraction HEP Update for hamstring stretch Manual Therapy: KT tape for left knee  2 I strips and 1 compression strip over tibial tuberosity Manual STW over quad and hamstring of left LE                                                                                                                             [facility] Adult PT Treatment:                                                DATE: 12/29/22 Therapeutic Exercise: LAQ 2x8 BIL  Seated heel slide w/ towel, 2x8 BIL  Standing heel raise at counter 2x12 cues for trunk posture  SLR 3x5 BIL cues for full quad contraction HEP update + education, relevant anatomy/physiology as it pertains to exercise/symptoms and rationale for interventions  Therapeutic Activity: 4 inch step up BIL x8 at counter for UE support, cues for mechanics, weight shifting, and increased incorporation of quad 2inch step up w red band TKE x12 BIL at counter; working on incorporating more quad into functional mechanics STS 2x5 from slightly raised mat; cues for mechanics and pacing    [facility]  Adult PT Treatment:                                                DATE: 12/23/22 Therapeutic Exercise: LAQ x8 BIL Heel slide x8 BIL, seated w/ towel HEP handout + education    PATIENT EDUCATION:  Education details: rationale for interventions, HEP  Person educated: Patient Education method: Explanation, Demonstration, Tactile cues, Verbal cues, and Handouts Education comprehension: verbalized understanding, returned demonstration, verbal cues required, tactile cues required, and needs further education    HOME EXERCISE PROGRAM: Access Code: [email or web address] URL: [email or web address] Date: 12/29/2022 Prepared by: Fransisco Hertz  Exercises - Seated Long Arc Quad  - 2-3 x daily - 7 x weekly - 1 sets - 8 reps - Seated Heel Slide  - 2-3 x daily - 7 x weekly - 1 sets  - 8 reps - Sit to Stand with Armchair  - 2-3 x daily - 7 x weekly - 1 sets - 5 reps - Small Range Straight Leg Raise  - 2-3 x daily - 7 x weekly - 1 sets - 5 reps Added 01-11-23 - Forward Step Up with Counter Support  - 1 x daily - 7 x weekly - 3 sets - 10 reps - Lateral Step Up with Unilateral Counter Support  - 1 x daily - 7 x weekly - 3 sets - 10 reps ASSESSMENT:  CLINICAL IMPRESSION: 01/11/2023 Pt arrives 7/10  pain in left knee and PT performs some manual and kt taping before performing standing to floor transfers. And exercises with increased challenge and progressive weights.  Pt then with hamstring stretch and exercises at end and notes decreased in tension and pain to 0/10. Progressed with closed chain forward and lateral step ups.  Recommend continuing along current POC in order to address relevant deficits and improve functional tolerance. Pt departs today's session in no acute distress, all voiced questions/concerns addressed appropriately from PT perspective.    Per eval - Patient is a very pleasant 62 y.o. woman who was seen today for physical therapy evaluation and treatment for chronic knee pain. Pt endorses ongoing difficulties with WB activities, knee mobility, and stair navigation/walking. On exam, pt demonstrates limitations in BIL knee mobility and strength, L more so than R. TUG time indicative of fall risk (see goal below). Does well with HEP/exam, mild symptom irritability but no increase in resting pain, no adverse events. Recommend trial of skilled PT to address aforementioned deficits with aim of improving functional tolerance and reducing pain with typical activities. Pt departs today's session in no acute distress, all voiced concerns/questions addressed appropriately from PT perspective.     OBJECTIVE IMPAIRMENTS: Abnormal gait, decreased activity tolerance, decreased endurance, decreased mobility, difficulty walking, decreased ROM, decreased strength, impaired perceived  functional ability, improper body mechanics, and pain.   ACTIVITY LIMITATIONS: carrying, lifting, bending, standing, squatting, stairs, transfers, and locomotion level  PARTICIPATION LIMITATIONS: meal prep, cleaning, laundry, and community activity  PERSONAL FACTORS: Age, Time since onset of injury/illness/exacerbation, and 3+ comorbidities: DM, HTN, depression  are also affecting patient's functional outcome.   REHAB POTENTIAL: Fair given chronicity/comorbidities  CLINICAL DECISION MAKING: Stable/uncomplicated  EVALUATION COMPLEXITY: Low   GOALS: Goals reviewed with patient? Yes  SHORT TERM GOALS: Target date: 01/20/2023 Pt will demonstrate appropriate understanding and performance of initially prescribed HEP in order to facilitate improved independence with management of symptoms.  Baseline: HEP provided on eval Goal status: MET  2. Pt will score greater than or equal to 54 on FOTO in order to demonstrate improved perception of function due to symptoms.  Baseline: 50  Goal status: INITIAL    LONG TERM GOALS: Target date: 02/17/2023 Pt will score 58 or greater on FOTO in order to demonstrate improved perception of function due to symptoms.  Baseline: 50 Goal status: INITIAL  2.  Pt will demonstrate at least 110 degrees of knee flexion AROM in order to facilitate improved tolerance to functional movements such as squatting/stairs.  Baseline: see ROM chart above Goal status: INITIAL  3.  Pt will demonstrate global LE MMT of at least 4/5 bilaterally for improved functional strength. Baseline: see MMT chart above Goal status: INITIAL  4.  Pt will be able to perform TUG in less than or equal to 13 sec in order to indicate reduced risk of falling (cutoff score for fall risk 13.5 sec in community dwelling older adults per [facility] et al, 2000)  Baseline: 17.93sec no AD  Goal status: INITIAL    5. Pt will demonstrate appropriate performance of final prescribed HEP in order to  facilitate improved self-management of symptoms post-discharge.   Baseline: initial HEP prescribed  Goal status: INITIAL    6. Pt will report at least 50% decrease in overall pain levels in past week in order to facilitate improved tolerance to basic ADLs/mobility.   Baseline: 7-9/10  Goal status: INITIAL     PLAN:  PT FREQUENCY: 2x/week  PT DURATION: 8 weeks  PLANNED INTERVENTIONS: Therapeutic exercises, Therapeutic activity, Neuromuscular re-education, Balance training, Gait training, Patient/Family education, Self Care, Joint mobilization, Stair training, Aquatic Therapy, Dry Needling, Cryotherapy, Moist heat, Taping, Manual therapy, and Re-evaluation  PLAN FOR NEXT SESSION: Review/update HEP PRN. Work on [employer] exercises as appropriate with emphasis on open and closed chain LE strengthening, quad activation, and knee mobility. Symptom modification strategies as indicated/appropriate.      Garen Lah, PT, ATRIC Certified Exercise Expert for the Aging Adult  01/11/23 3:04 PM Phone: [phone] Fax: [phone]

## 2023-01-13 ENCOUNTER — Ambulatory Visit: Payer: Medicare HMO | Admitting: Physical Therapy

## 2023-01-17 ENCOUNTER — Other Ambulatory Visit (INDEPENDENT_AMBULATORY_CARE_PROVIDER_SITE_OTHER): Payer: Self-pay | Admitting: Family Medicine

## 2023-01-17 DIAGNOSIS — E559 Vitamin D deficiency, unspecified: Secondary | ICD-10-CM

## 2023-01-18 ENCOUNTER — Ambulatory Visit: Payer: Medicare HMO | Admitting: Physical Therapy

## 2023-01-18 ENCOUNTER — Encounter: Payer: Self-pay | Admitting: Physical Therapy

## 2023-01-18 DIAGNOSIS — M25562 Pain in left knee: Secondary | ICD-10-CM | POA: Diagnosis not present

## 2023-01-18 DIAGNOSIS — M6281 Muscle weakness (generalized): Secondary | ICD-10-CM | POA: Diagnosis not present

## 2023-01-18 DIAGNOSIS — G8929 Other chronic pain: Secondary | ICD-10-CM | POA: Diagnosis not present

## 2023-01-18 DIAGNOSIS — M25561 Pain in right knee: Secondary | ICD-10-CM | POA: Diagnosis not present

## 2023-01-18 NOTE — Therapy (Signed)
OUTPATIENT PHYSICAL THERAPY TREATMENT NOTE   Patient Name: [name] MRN: [id number] DOB:10-Mar-1961, 62 y.o., female Today's Date: 01/18/2023  END OF SESSION:  PT End of Session - 01/18/23 1303     Visit Number 5    Number of Visits 17    Date for PT Re-Evaluation 02/17/23    Authorization Type [employer]    Authorization Time Period 12 visits 12/27/22-02/11/23    Authorization - Visit Number 4    Authorization - Number of Visits 12    PT Start Time 1305    PT Stop Time 1347    PT Time Calculation (min) 42 min    Activity Tolerance Patient tolerated treatment well    Behavior During Therapy WFL for tasks assessed/performed                 Past Medical History:  Diagnosis Date   Acid reflux    Arthritis    Back pain    Depression    Diabetes mellitus without complication (HCC)    Edema, lower extremity    Glaucoma    High cholesterol    Hypertension    Joint pain    Nerve damage    Shortness of breath    Vitamin D deficiency    Past Surgical History:  Procedure Laterality Date   btl     CHOLECYSTECTOMY  2001   COLONOSCOPY WITH PROPOFOL N/A 01/06/2021   Procedure: COLONOSCOPY WITH PROPOFOL;  Surgeon: Benancio Deeds, MD;  Location: [facility];  Service: Gastroenterology;  Laterality: N/A;   POLYPECTOMY  01/06/2021   Procedure: POLYPECTOMY;  Surgeon: Benancio Deeds, MD;  Location: [facility];  Service: Gastroenterology;;   Patient Active Problem List   Diagnosis Date Noted   Morbid obesity (HCC)-start bmi 51.47 05/03/2022   OSA (obstructive sleep apnea) 04/01/2022   Type 2 diabetes mellitus with obesity (HCC) 08/26/2021   At risk for heart disease 05/25/2021   Benign neoplasm of colon    Depression 06/02/2020   Other chronic pain 05/15/2020   SOB (shortness of breath) 04/05/2020   Hyperlipidemia associated with type 2 diabetes mellitus (HCC) 02/04/2020   Vitamin D deficiency 01/16/2020   Encounter for screening colonoscopy  01/16/2020   Major depressive disorder, recurrent episode, mild (HCC) 01/12/2020   Hypertension associated with type 2 diabetes mellitus (HCC) 01/02/2020   BMI 50.0-59.9, adult (HCC) 05/21/2019   Chronic pain of left knee 05/21/2019   Chronic pain of both knees 05/21/2019   Unilateral primary osteoarthritis, left knee 05/21/2019   Unilateral primary osteoarthritis, right knee 05/21/2019   ACROCHORDON 11/12/2009   PERIPHERAL EDEMA 08/01/2009   Pain in joint, lower leg 07/07/2009   SINUS TACHYCARDIA 04/09/2009   BACK PAIN, LUMBOSACRAL, CHRONIC 06/17/2008   SHOULDER PAIN, RIGHT, CHRONIC 05/21/2008   PERIPHERAL NEUROPATHY, MILD 03/05/2008   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   Diabetes mellitus (HCC) 05/19/2006   HYPERCHOLESTEROLEMIA 05/19/2006   OBESITY, NOS 05/19/2006   DEPRESSIVE DISORDER, NOS 05/19/2006   HYPERTENSION, BENIGN SYSTEMIC 05/19/2006   RHINITIS, ALLERGIC 05/19/2006   GASTROESOPHAGEAL REFLUX, NO ESOPHAGITIS 05/19/2006    PCP: Thana Ates, MD  REFERRING PROVIDER: Persons, [place], [place]  REFERRING DIAG: [phone] (ICD-10-CM) - Chronic pain of right knee (referral comment: eval and treat bilateral knees)  THERAPY DIAG:  Chronic pain of both knees  Muscle weakness (generalized)  Rationale for Evaluation and Treatment: Rehabilitation  ONSET DATE: a few years  SUBJECTIVE:  Per eval - Endorses gradual onset of pain over  past few years, no MOI. Slowly worsening. States she was told she has arthritis. States her L leg in particular will give out when fatigued, although this has improved.  Walks with her daughter 15 min in the morning and in the evening.   SUBJECTIVE STATEMENT: 01/18/2023  Pt arrives w/ report of 7/10 pain but describes it as "not a lot of pain". Pt states she was walking this morning and has a little extra pain. Feels like she is doing well since starting therapy, noting improved tolerance to stairs.      PERTINENT HISTORY: DM, HTN, SOB,  depression, chronic pain multi-site PAIN:    Are you having pain today? See subjective section above  Per eval -  Location/description: L>R, medial and lateral joint lines, deep; feels like grinding Best-worst over past week: 7-9/10  - aggravating factors: stairs, walking >39min, getting down on floor - Easing factors: heated blanket, movement, medication  PRECAUTIONS: None  WEIGHT BEARING RESTRICTIONS: No  FALLS:  Has patient fallen in last 6 months? No  LIVING ENVIRONMENT: 1 level house, 5-6 STE from front with one rail on L, 2STE from back  Lives alone, gets help with yardwork. Pt independent with housework  OCCUPATION: used to work for [employer] - not working since 4-5 years ago due to reported health issues   PLOF: Independent  PATIENT GOALS: wants to be able to exercise to lose weight  NEXT MD VISIT: TBD   OBJECTIVE: (objective measures completed at initial evaluation unless otherwise dated)   DIAGNOSTIC FINDINGS:  11/15/22 R knee XR indicative of tricompartmental arthritis, no acute fracture (per EPIC review)  08/26/22 L knee XR (refer to EPIC for details) Results narrative: "Radiographs of her left knee were reviewed today.  She does have varus  alignment with tricompartmental arthritis most notably in the medial  compartment with periarticular osteophytes loss of joint space and  sclerotic changes no acute fractures "  PATIENT SURVEYS:  FOTO 50 current, 58 predicted FOTO 01/18/23: 57   COGNITION: Overall cognitive status: Within functional limits for tasks assessed     SENSATION: Grossly intact BIL   EDEMA:  No appreciable swelling on observation, obscured by pt attire - she denies swelling issues   PALPATION: Concordant generalized tenderness about knee joints   LOWER EXTREMITY ROM:     Active  Right eval Left eval  Hip flexion    Hip extension    Hip internal rotation    Hip external rotation    Knee extension Full * Full *  Knee  flexion Seated: 96 * Seated: 90 deg *  (Blank rows = not tested) (Key: WFL = within functional limits not formally assessed, * = concordant pain, s = stiffness/stretching sensation, NT = not tested)  Comments:    LOWER EXTREMITY MMT:    MMT Right eval Left eval  Hip flexion 4 * 4*  Hip abduction (modified sitting) 4+ 4+  Hip internal rotation    Hip external rotation    Knee flexion 4 * 3+ *  Knee extension 4 * 3+ *  Ankle dorsiflexion     (Blank rows = not tested) (Key: WFL = within functional limits not formally assessed, * = concordant pain, s = stiffness/stretching sensation, NT = not tested)  Comments:     LOWER EXTREMITY SPECIAL TESTS:  deferred  FUNCTIONAL TESTS:  TUG: 17.93sec no AD, inc knee pain  GAIT: Distance walked: within clinic Assistive device utilized: None Level of assistance: Complete Independence Comments:  reduced gait speed/cadence, widened BOS, reduced trunk rotation, reduced step length   TODAY'S TREATMENT:  [facility] Adult PT Treatment:                                                DATE: 01/19/23 Therapeutic Exercise: STS standard chair, 2x8 weaning UE support cues for pacing LAQ x12 BIL unresisted, 4# x12 BIL cues for pacing SLR unresisted x15 BIL  Standing hip flexor stretch with UE support,  x8 BIL gentle rocking  HEP update + handout/education  Therapeutic Activity: Step up BW 6 inch x12 BIL LE Step up 4 inch 5# 2x10 BIL LE Lunge at counter, 2x8 BIL LE cues for mechanics and comfortable range FOTO + education   [facility] Adult PT Treatment:                                                DATE: 11-11-22 Therapeutic Exercise: Tall kneeling- vertical bridge 2 x 10 Step up on 6 inch leading with L LE  2 x 10 Step down on 2 inch L LE remaining on step  2x 10  LAQ 1 x 10 BIL LAQ 2 x  12  with 3 lb cuff weight Heel raise with bil 3 lb weights on  2 x 12 SLR with 3 # cuff weights 2 x 10 each side R hip flexor stretch Seated hamstring stretch  (Kiss the grandma cue) Left knee  30 sec x 2 Manual Therapy: KT tape for left knee  2 I strips and 1 compression strip over tibial tuberosity Manual STW over quad and hamstring of left LE  Therapeutic Activity: Transfers from standing to tall kneeling x 4 Transfer from standing to supine on floor x 2 rising using stronger R LE in 1/2 kneeling  use of there ex pad for padding for knees  [facility] Adult PT Treatment:                                                DATE: 01-06-23 Therapeutic Exercise: Recumbent bike for 3  minutes but Left knee painful so stopped  LAQ 1 x 8 BIL LAQ 2 x 8 with 2 lb cuff weight Seated hamstring stretch (Kiss the grandma cue) Left knee  30 sec x 2 Seated heel slide w/ towel, 2x8 BIL  Standing heel raise at counter 2x12 cues for trunk posture  SLR 3x5 BIL cues for full quad contraction HEP Update for hamstring stretch Manual Therapy: KT tape for left knee  2 I strips and 1 compression strip over tibial tuberosity Manual STW over quad and hamstring of left LE    PATIENT EDUCATION:  Education details: rationale for interventions, HEP  Person educated: Patient Education method: Explanation, Demonstration, Tactile cues, Verbal cues, and Handouts Education comprehension: verbalized understanding, returned demonstration, verbal cues required, tactile cues required, and needs further education    HOME EXERCISE PROGRAM: Access Code: [email or web address] URL: [email or web address] Date: 01/18/2023 Prepared by: Fransisco Hertz  Exercises - Sit to Stand with Armchair  - 2-3 x daily - 7 x weekly - 1 sets - 8 reps -  Small Range Straight Leg Raise  - 2-3 x daily - 7 x weekly - 1 sets - 15 reps - Seated Hamstring Stretch  - 1-2 x daily - 7 x weekly - 1 sets - 3 reps - 30 sec hold - Forward Step Up with Counter Support  - 1 x daily - 7 x weekly - 3 sets - 10 reps - Lateral Step Up with Unilateral Counter Support  - 1 x daily - 7 x weekly - 3 sets - 10 reps - Standing  Partial Lunge  - 1 x daily - 7 x weekly - 2 sets - 8 reps   ASSESSMENT:  CLINICAL IMPRESSION: 01/18/2023 Pt arrives w/ "not a lot of pain" although she rates at 7/10; continues to endorse improved strength and activity tolerance with less pain. Today continuing to progress well with increased volume for closed chain strengthening. Tolerates well with cues as above. No adverse events, reports resolution of pain on departure. Recommend continuing along current POC in order to address relevant deficits and improve functional tolerance. Pt departs today's session in no acute distress, all voiced questions/concerns addressed appropriately from PT perspective.    Per eval - Patient is a very pleasant 62 y.o. woman who was seen today for physical therapy evaluation and treatment for chronic knee pain. Pt endorses ongoing difficulties with WB activities, knee mobility, and stair navigation/walking. On exam, pt demonstrates limitations in BIL knee mobility and strength, L more so than R. TUG time indicative of fall risk (see goal below). Does well with HEP/exam, mild symptom irritability but no increase in resting pain, no adverse events. Recommend trial of skilled PT to address aforementioned deficits with aim of improving functional tolerance and reducing pain with typical activities. Pt departs today's session in no acute distress, all voiced concerns/questions addressed appropriately from PT perspective.     OBJECTIVE IMPAIRMENTS: Abnormal gait, decreased activity tolerance, decreased endurance, decreased mobility, difficulty walking, decreased ROM, decreased strength, impaired perceived functional ability, improper body mechanics, and pain.   ACTIVITY LIMITATIONS: carrying, lifting, bending, standing, squatting, stairs, transfers, and locomotion level  PARTICIPATION LIMITATIONS: meal prep, cleaning, laundry, and community activity  PERSONAL FACTORS: Age, Time since onset of injury/illness/exacerbation,  and 3+ comorbidities: DM, HTN, depression  are also affecting patient's functional outcome.   REHAB POTENTIAL: Fair given chronicity/comorbidities  CLINICAL DECISION MAKING: Stable/uncomplicated  EVALUATION COMPLEXITY: Low   GOALS: Goals reviewed with patient? Yes  SHORT TERM GOALS: Target date: 01/20/2023 Pt will demonstrate appropriate understanding and performance of initially prescribed HEP in order to facilitate improved independence with management of symptoms.  Baseline: HEP provided on eval Goal status: MET  2. Pt will score greater than or equal to 54 on FOTO in order to demonstrate improved perception of function due to symptoms.  Baseline: 50  Goal status: INITIAL    LONG TERM GOALS: Target date: 02/17/2023 Pt will score 58 or greater on FOTO in order to demonstrate improved perception of function due to symptoms.  Baseline: 50 Goal status: INITIAL  2.  Pt will demonstrate at least 110 degrees of knee flexion AROM in order to facilitate improved tolerance to functional movements such as squatting/stairs.  Baseline: see ROM chart above Goal status: INITIAL  3.  Pt will demonstrate global LE MMT of at least 4/5 bilaterally for improved functional strength. Baseline: see MMT chart above Goal status: INITIAL  4.  Pt will be able to perform TUG in less than or equal to 13 sec in order to  indicate reduced risk of falling (cutoff score for fall risk 13.5 sec in community dwelling older adults per [facility] et al, 2000)  Baseline: 17.93sec no AD  Goal status: INITIAL    5. Pt will demonstrate appropriate performance of final prescribed HEP in order to facilitate improved self-management of symptoms post-discharge.   Baseline: initial HEP prescribed  Goal status: INITIAL    6. Pt will report at least 50% decrease in overall pain levels in past week in order to facilitate improved tolerance to basic ADLs/mobility.   Baseline: 7-9/10  Goal status: INITIAL      PLAN:  PT FREQUENCY: 2x/week  PT DURATION: 8 weeks  PLANNED INTERVENTIONS: Therapeutic exercises, Therapeutic activity, Neuromuscular re-education, Balance training, Gait training, Patient/Family education, Self Care, Joint mobilization, Stair training, Aquatic Therapy, Dry Needling, Cryotherapy, Moist heat, Taping, Manual therapy, and Re-evaluation  PLAN FOR NEXT SESSION: Review/update HEP PRN. Work on [employer] exercises as appropriate with emphasis on open and closed chain LE strengthening, quad activation, and knee mobility. Symptom modification strategies as indicated/appropriate.      Ashley Murrain PT, DPT 01/18/2023 1:57 PM

## 2023-01-20 ENCOUNTER — Encounter: Payer: Self-pay | Admitting: Physical Therapy

## 2023-01-20 ENCOUNTER — Ambulatory Visit: Payer: Medicare HMO | Admitting: Physical Therapy

## 2023-01-20 DIAGNOSIS — M25561 Pain in right knee: Secondary | ICD-10-CM | POA: Diagnosis not present

## 2023-01-20 DIAGNOSIS — G8929 Other chronic pain: Secondary | ICD-10-CM | POA: Diagnosis not present

## 2023-01-20 DIAGNOSIS — M25562 Pain in left knee: Secondary | ICD-10-CM | POA: Diagnosis not present

## 2023-01-20 DIAGNOSIS — M6281 Muscle weakness (generalized): Secondary | ICD-10-CM

## 2023-01-20 NOTE — Therapy (Signed)
OUTPATIENT PHYSICAL THERAPY TREATMENT NOTE   Patient Name: [name] MRN: [id number] DOB:Mar 10, 1961, 62 y.o., female Today's Date: 01/20/2023  END OF SESSION:  PT End of Session - 01/20/23 1422     Visit Number 6    Number of Visits 17    Date for PT Re-Evaluation 02/17/23    Authorization Type [employer]    Authorization Time Period 12 visits 12/27/22-02/11/23    Authorization - Visit Number 5    Authorization - Number of Visits 12    PT Start Time 1420    PT Stop Time 1500    PT Time Calculation (min) 40 min    Activity Tolerance Patient tolerated treatment well    Behavior During Therapy WFL for tasks assessed/performed                  Past Medical History:  Diagnosis Date   Acid reflux    Arthritis    Back pain    Depression    Diabetes mellitus without complication (HCC)    Edema, lower extremity    Glaucoma    High cholesterol    Hypertension    Joint pain    Nerve damage    Shortness of breath    Vitamin D deficiency    Past Surgical History:  Procedure Laterality Date   btl     CHOLECYSTECTOMY  2001   COLONOSCOPY WITH PROPOFOL N/A 01/06/2021   Procedure: COLONOSCOPY WITH PROPOFOL;  Surgeon: Benancio Deeds, MD;  Location: [facility];  Service: Gastroenterology;  Laterality: N/A;   POLYPECTOMY  01/06/2021   Procedure: POLYPECTOMY;  Surgeon: Benancio Deeds, MD;  Location: [facility];  Service: Gastroenterology;;   Patient Active Problem List   Diagnosis Date Noted   Morbid obesity (HCC)-start bmi 51.47 05/03/2022   OSA (obstructive sleep apnea) 04/01/2022   Type 2 diabetes mellitus with obesity (HCC) 08/26/2021   At risk for heart disease 05/25/2021   Benign neoplasm of colon    Depression 06/02/2020   Other chronic pain 05/15/2020   SOB (shortness of breath) 04/05/2020   Hyperlipidemia associated with type 2 diabetes mellitus (HCC) 02/04/2020   Vitamin D deficiency 01/16/2020   Encounter for screening colonoscopy  01/16/2020   Major depressive disorder, recurrent episode, mild (HCC) 01/12/2020   Hypertension associated with type 2 diabetes mellitus (HCC) 01/02/2020   BMI 50.0-59.9, adult (HCC) 05/21/2019   Chronic pain of left knee 05/21/2019   Chronic pain of both knees 05/21/2019   Unilateral primary osteoarthritis, left knee 05/21/2019   Unilateral primary osteoarthritis, right knee 05/21/2019   ACROCHORDON 11/12/2009   PERIPHERAL EDEMA 08/01/2009   Pain in joint, lower leg 07/07/2009   SINUS TACHYCARDIA 04/09/2009   BACK PAIN, LUMBOSACRAL, CHRONIC 06/17/2008   SHOULDER PAIN, RIGHT, CHRONIC 05/21/2008   PERIPHERAL NEUROPATHY, MILD 03/05/2008   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   Diabetes mellitus (HCC) 05/19/2006   HYPERCHOLESTEROLEMIA 05/19/2006   OBESITY, NOS 05/19/2006   DEPRESSIVE DISORDER, NOS 05/19/2006   HYPERTENSION, BENIGN SYSTEMIC 05/19/2006   RHINITIS, ALLERGIC 05/19/2006   GASTROESOPHAGEAL REFLUX, NO ESOPHAGITIS 05/19/2006    PCP: Thana Ates, MD  REFERRING PROVIDER: Persons, [place], [place]  REFERRING DIAG: [phone] (ICD-10-CM) - Chronic pain of right knee (referral comment: eval and treat bilateral knees)  THERAPY DIAG:  No diagnosis found.  Rationale for Evaluation and Treatment: Rehabilitation  ONSET DATE: a few years  SUBJECTIVE:  Per eval - Endorses gradual onset of pain over past few years, no MOI.  Slowly worsening. States she was told she has arthritis. States her L leg in particular will give out when fatigued, although this has improved.  Walks with her daughter 15 min in the morning and in the evening.   SUBJECTIVE STATEMENT: 01/20/2023  Pt arrives w/ report of 6/10 pain but describes it as "not a lot of pain". But describes going up steps at the beginning a 9/10 but now she is doing so much better. Pt states she is doing 40 minutes   PERTINENT HISTORY: DM, HTN, SOB, depression, chronic pain multi-site PAIN:    Are you having pain today? See  subjective section above  Per eval -  Location/description: L>R, medial and lateral joint lines, deep; feels like grinding Best-worst over past week: 7-9/10  - aggravating factors: stairs, walking >24min, getting down on floor - Easing factors: heated blanket, movement, medication  PRECAUTIONS: None  WEIGHT BEARING RESTRICTIONS: No  FALLS:  Has patient fallen in last 6 months? No  LIVING ENVIRONMENT: 1 level house, 5-6 STE from front with one rail on L, 2STE from back  Lives alone, gets help with yardwork. Pt independent with housework  OCCUPATION: used to work for [employer] - not working since 4-5 years ago due to reported health issues   PLOF: Independent  PATIENT GOALS: wants to be able to exercise to lose weight  NEXT MD VISIT: TBD   OBJECTIVE: (objective measures completed at initial evaluation unless otherwise dated)   DIAGNOSTIC FINDINGS:  11/15/22 R knee XR indicative of tricompartmental arthritis, no acute fracture (per EPIC review)  08/26/22 L knee XR (refer to EPIC for details) Results narrative: "Radiographs of her left knee were reviewed today.  She does have varus  alignment with tricompartmental arthritis most notably in the medial  compartment with periarticular osteophytes loss of joint space and  sclerotic changes no acute fractures "  PATIENT SURVEYS:  FOTO 50 current, 58 predicted FOTO 01/18/23: 57   COGNITION: Overall cognitive status: Within functional limits for tasks assessed     SENSATION: Grossly intact BIL   EDEMA:  No appreciable swelling on observation, obscured by pt attire - she denies swelling issues   PALPATION: Concordant generalized tenderness about knee joints   LOWER EXTREMITY ROM:     Active  Right eval Left eval 01-20-23 R/L  Hip flexion     Hip extension     Hip internal rotation     Hip external rotation     Knee extension Full * Full * 0/0  Knee flexion Seated: 96 * Seated: 90 deg * 124/115  (Blank  rows = not tested) (Key: WFL = within functional limits not formally assessed, * = concordant pain, s = stiffness/stretching sensation, NT = not tested)  Comments:    LOWER EXTREMITY MMT:    MMT Right eval Left eval Left 01-20-23  Hip flexion 4 * 4* 4+  Hip abduction (modified sitting) 4+ 4+   Hip internal rotation     Hip external rotation     Knee flexion 4 * 3+ *   Knee extension 4 * 3+ *   Ankle dorsiflexion      (Blank rows = not tested) (Key: WFL = within functional limits not formally assessed, * = concordant pain, s = stiffness/stretching sensation, NT = not tested)  Comments:     LOWER EXTREMITY SPECIAL TESTS:  deferred  FUNCTIONAL TESTS:  TUG: 17.93sec no AD, inc knee pain  GAIT: Distance walked: within clinic Assistive device utilized:  None Level of assistance: Complete Independence Comments: reduced gait speed/cadence, widened BOS, reduced trunk rotation, reduced step length   TODAY'S TREATMENT:  [facility] Adult PT Treatment:                                                DATE: 01-20-23 Therapeutic Exercise: STS standard chair 15 # KB cues for pacing Standing hip flexor stretch with UE support,  x8 BIL gentle rocking  SLR unresisted x15 BIL  Reverse lunge x 10 bil High march step over yoga block at 4inch height and 12 inch height at counter for UE support  Therapeutic Activity: Stand to floor transfer.  Simulated  floor to mat with  "simulated injured arm and simulated injured leg" Step up BW 6 inch x12 BIL LE Step down 4 inch  2x10 BIL LE  [facility] Adult PT Treatment:                                                DATE: 01/18/23 Therapeutic Exercise: STS standard chair, 2x8 weaning UE support cues for pacing LAQ x12 BIL unresisted, 4# x12 BIL cues for pacing SLR unresisted x15 BIL  Standing hip flexor stretch with UE support,  x8 BIL gentle rocking  HEP update + handout/education  Therapeutic Activity: Step up BW 6 inch x12 BIL LE Step up 4 inch 5#  2x10 BIL LE Lunge at counter, 2x8 BIL LE cues for mechanics and comfortable range FOTO + education   [facility] Adult PT Treatment:                                                DATE: 01-11-23 Therapeutic Exercise: Tall kneeling- vertical bridge 2 x 10 Step up on 6 inch leading with L LE  2 x 10 Step down on 2 inch L LE remaining on step  2x 10  LAQ 1 x 10 BIL LAQ 2 x  12  with 3 lb cuff weight Heel raise with bil 3 lb weights on  2 x 12 SLR with 3 # cuff weights 2 x 10 each side R hip flexor stretch Seated hamstring stretch (Kiss the grandma cue) Left knee  30 sec x 2 Manual Therapy: KT tape for left knee  2 I strips and 1 compression strip over tibial tuberosity Manual STW over quad and hamstring of left LE  Therapeutic Activity: Transfers from standing to tall kneeling x 4 Transfer from standing to supine on floor x 2 rising using stronger R LE in 1/2 kneeling  use of there ex pad for padding for knees  [facility] Adult PT Treatment:                                                DATE: 01-06-23 Therapeutic Exercise: Recumbent bike for 3  minutes but Left knee painful so stopped  LAQ 1 x 8 BIL LAQ 2 x 8 with 2 lb cuff weight Seated hamstring stretch (Kiss the  grandma cue) Left knee  30 sec x 2 Seated heel slide w/ towel, 2x8 BIL  Standing heel raise at counter 2x12 cues for trunk posture  SLR 3x5 BIL cues for full quad contraction HEP Update for hamstring stretch Manual Therapy: KT tape for left knee  2 I strips and 1 compression strip over tibial tuberosity Manual STW over quad and hamstring of left LE    PATIENT EDUCATION:  Education details: rationale for interventions, HEP  Person educated: Patient Education method: Explanation, Demonstration, Tactile cues, Verbal cues, and Handouts Education comprehension: verbalized understanding, returned demonstration, verbal cues required, tactile cues required, and needs further education    HOME EXERCISE PROGRAM: Access Code:  [email or web address] URL: [email or web address] Date: 01/18/2023 Prepared by: Fransisco Hertz  Exercises - Sit to Stand with Armchair  - 2-3 x daily - 7 x weekly - 1 sets - 8 reps - Small Range Straight Leg Raise  - 2-3 x daily - 7 x weekly - 1 sets - 15 reps - Seated Hamstring Stretch  - 1-2 x daily - 7 x weekly - 1 sets - 3 reps - 30 sec hold - Forward Step Up with Counter Support  - 1 x daily - 7 x weekly - 3 sets - 10 reps - Lateral Step Up with Unilateral Counter Support  - 1 x daily - 7 x weekly - 3 sets - 10 reps - Standing Partial Lunge  - 1 x daily - 7 x weekly - 2 sets - 8 reps   ASSESSMENT:  CLINICAL IMPRESSION: 01/20/2023 Pt arrives  6/10 but she does admit she is not any where near the pain level she was on eval and mostly just sore. She is now able to walk for 40 minutes and she does feel like she is stronger which her MMT confrims today.  Pt is concerned about living alone and session concentrated on floor to stand and vice versa transfers and simulation of injurie and problem solving for  how to mobilize for help.    Today continuing to progress well with increased challenge for closed chain strengthening.  No adverse events, reports no pain at end of session. Pt departs today's session in no acute distress, all voiced questions/concerns addressed appropriately from PT perspective.    Per eval - Patient is a very pleasant 62 y.o. woman who was seen today for physical therapy evaluation and treatment for chronic knee pain. Pt endorses ongoing difficulties with WB activities, knee mobility, and stair navigation/walking. On exam, pt demonstrates limitations in BIL knee mobility and strength, L more so than R. TUG time indicative of fall risk (see goal below). Does well with HEP/exam, mild symptom irritability but no increase in resting pain, no adverse events. Recommend trial of skilled PT to address aforementioned deficits with aim of improving functional tolerance and reducing pain  with typical activities. Pt departs today's session in no acute distress, all voiced concerns/questions addressed appropriately from PT perspective.     OBJECTIVE IMPAIRMENTS: Abnormal gait, decreased activity tolerance, decreased endurance, decreased mobility, difficulty walking, decreased ROM, decreased strength, impaired perceived functional ability, improper body mechanics, and pain.   ACTIVITY LIMITATIONS: carrying, lifting, bending, standing, squatting, stairs, transfers, and locomotion level  PARTICIPATION LIMITATIONS: meal prep, cleaning, laundry, and community activity  PERSONAL FACTORS: Age, Time since onset of injury/illness/exacerbation, and 3+ comorbidities: DM, HTN, depression  are also affecting patient's functional outcome.   REHAB POTENTIAL: Fair given chronicity/comorbidities  CLINICAL DECISION MAKING: Stable/uncomplicated  EVALUATION COMPLEXITY: Low  GOALS: Goals reviewed with patient? Yes  SHORT TERM GOALS: Target date: 01/20/2023 Pt will demonstrate appropriate understanding and performance of initially prescribed HEP in order to facilitate improved independence with management of symptoms.  Baseline: HEP provided on eval Goal status: MET  2. Pt will score greater than or equal to 54 on FOTO in order to demonstrate improved perception of function due to symptoms.  Baseline: 50  Goal status: INITIAL    LONG TERM GOALS: Target date: 02/17/2023 Pt will score 58 or greater on FOTO in order to demonstrate improved perception of function due to symptoms.  Baseline: 50 Goal status: INITIAL  2.  Pt will demonstrate at least 110 degrees of knee flexion AROM in order to facilitate improved tolerance to functional movements such as squatting/stairs.  Baseline: see ROM chart above Goal status: INITIAL  3.  Pt will demonstrate global LE MMT of at least 4/5 bilaterally for improved functional strength. Baseline: see MMT chart above Goal status: INITIAL  4.  Pt will  be able to perform TUG in less than or equal to 13 sec in order to indicate reduced risk of falling (cutoff score for fall risk 13.5 sec in community dwelling older adults per [facility] et al, 2000)  Baseline: 17.93sec no AD  Goal status: INITIAL    5. Pt will demonstrate appropriate performance of final prescribed HEP in order to facilitate improved self-management of symptoms post-discharge.   Baseline: initial HEP prescribed  Goal status: INITIAL    6. Pt will report at least 50% decrease in overall pain levels in past week in order to facilitate improved tolerance to basic ADLs/mobility.   Baseline: 7-9/10  Goal status: INITIAL     PLAN:  PT FREQUENCY: 2x/week  PT DURATION: 8 weeks  PLANNED INTERVENTIONS: Therapeutic exercises, Therapeutic activity, Neuromuscular re-education, Balance training, Gait training, Patient/Family education, Self Care, Joint mobilization, Stair training, Aquatic Therapy, Dry Needling, Cryotherapy, Moist heat, Taping, Manual therapy, and Re-evaluation  PLAN FOR NEXT SESSION: Review/update HEP PRN. Work on [employer] exercises as appropriate with emphasis on open and closed chain LE strengthening, quad activation, and knee mobility. Symptom modification strategies as indicated/appropriate.     Garen Lah, PT, ATRIC Certified Exercise Expert for the Aging Adult  01/20/23 3:32 PM Phone: [phone] Fax: [phone]

## 2023-01-25 ENCOUNTER — Ambulatory Visit: Payer: Medicare HMO | Attending: Physician Assistant | Admitting: Physical Therapy

## 2023-01-25 ENCOUNTER — Ambulatory Visit: Payer: Medicare HMO | Admitting: Physical Therapy

## 2023-01-25 DIAGNOSIS — G8929 Other chronic pain: Secondary | ICD-10-CM | POA: Diagnosis not present

## 2023-01-25 DIAGNOSIS — M25561 Pain in right knee: Secondary | ICD-10-CM | POA: Diagnosis not present

## 2023-01-25 DIAGNOSIS — M25562 Pain in left knee: Secondary | ICD-10-CM | POA: Insufficient documentation

## 2023-01-25 DIAGNOSIS — M6281 Muscle weakness (generalized): Secondary | ICD-10-CM | POA: Insufficient documentation

## 2023-01-25 NOTE — Therapy (Signed)
OUTPATIENT PHYSICAL THERAPY TREATMENT NOTE   Patient Name: [name] MRN: [id number] DOB:04-Nov-1960, 62 y.o., female Today's Date: 01/25/2023  END OF SESSION:  PT End of Session - 01/25/23 1204     Visit Number 7    Number of Visits 17    Date for PT Re-Evaluation 02/17/23    Authorization Type [employer]    Authorization Time Period 12 visits 12/27/22-02/11/23    Authorization - Visit Number 6    Authorization - Number of Visits 12    PT Start Time 1103    PT Stop Time 1145    PT Time Calculation (min) 42 min    Activity Tolerance Patient tolerated treatment well    Behavior During Therapy WFL for tasks assessed/performed                   Past Medical History:  Diagnosis Date   Acid reflux    Arthritis    Back pain    Depression    Diabetes mellitus without complication (HCC)    Edema, lower extremity    Glaucoma    High cholesterol    Hypertension    Joint pain    Nerve damage    Shortness of breath    Vitamin D deficiency    Past Surgical History:  Procedure Laterality Date   btl     CHOLECYSTECTOMY  2001   COLONOSCOPY WITH PROPOFOL N/A 01/06/2021   Procedure: COLONOSCOPY WITH PROPOFOL;  Surgeon: Benancio Deeds, MD;  Location: [facility];  Service: Gastroenterology;  Laterality: N/A;   POLYPECTOMY  01/06/2021   Procedure: POLYPECTOMY;  Surgeon: Benancio Deeds, MD;  Location: [facility];  Service: Gastroenterology;;   Patient Active Problem List   Diagnosis Date Noted   Morbid obesity (HCC)-start bmi 51.47 05/03/2022   OSA (obstructive sleep apnea) 04/01/2022   Type 2 diabetes mellitus with obesity (HCC) 08/26/2021   At risk for heart disease 05/25/2021   Benign neoplasm of colon    Depression 06/02/2020   Other chronic pain 05/15/2020   SOB (shortness of breath) 04/05/2020   Hyperlipidemia associated with type 2 diabetes mellitus (HCC) 02/04/2020   Vitamin D deficiency 01/16/2020   Encounter for screening  colonoscopy 01/16/2020   Major depressive disorder, recurrent episode, mild (HCC) 01/12/2020   Hypertension associated with type 2 diabetes mellitus (HCC) 01/02/2020   BMI 50.0-59.9, adult (HCC) 05/21/2019   Chronic pain of left knee 05/21/2019   Chronic pain of both knees 05/21/2019   Unilateral primary osteoarthritis, left knee 05/21/2019   Unilateral primary osteoarthritis, right knee 05/21/2019   ACROCHORDON 11/12/2009   PERIPHERAL EDEMA 08/01/2009   Pain in joint, lower leg 07/07/2009   SINUS TACHYCARDIA 04/09/2009   BACK PAIN, LUMBOSACRAL, CHRONIC 06/17/2008   SHOULDER PAIN, RIGHT, CHRONIC 05/21/2008   PERIPHERAL NEUROPATHY, MILD 03/05/2008   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   Diabetes mellitus (HCC) 05/19/2006   HYPERCHOLESTEROLEMIA 05/19/2006   OBESITY, NOS 05/19/2006   DEPRESSIVE DISORDER, NOS 05/19/2006   HYPERTENSION, BENIGN SYSTEMIC 05/19/2006   RHINITIS, ALLERGIC 05/19/2006   GASTROESOPHAGEAL REFLUX, NO ESOPHAGITIS 05/19/2006    PCP: Thana Ates, MD  REFERRING PROVIDER: Persons, [place], [place]  REFERRING DIAG: [phone] (ICD-10-CM) - Chronic pain of right knee (referral comment: eval and treat bilateral knees)  THERAPY DIAG:  Chronic pain of both knees  Muscle weakness (generalized)  Rationale for Evaluation and Treatment: Rehabilitation  ONSET DATE: a few years  SUBJECTIVE:  Per eval - Endorses gradual onset of  pain over past few years, no MOI. Slowly worsening. States she was told she has arthritis. States her L leg in particular will give out when fatigued, although this has improved.  Walks with her daughter 15 min in the morning and in the evening.   SUBJECTIVE STATEMENT: 01/25/2023  Pt woke up today and had no pain but did report a fall after walking for about 20 minutes.  Instead of 15 minutes. I think I got tired. And I tried to go up the steps and I fell and I got up and went on .   PERTINENT HISTORY: DM, HTN, SOB, depression, chronic pain  multi-site PAIN:    Are you having pain today? See subjective section above  Per eval -  Location/description: L>R, medial and lateral joint lines, deep; feels like grinding Best-worst over past week: 7-9/10  - aggravating factors: stairs, walking >29min, getting down on floor - Easing factors: heated blanket, movement, medication  PRECAUTIONS: None  WEIGHT BEARING RESTRICTIONS: No  FALLS:  Has patient fallen in last 6 months? No  LIVING ENVIRONMENT: 1 level house, 5-6 STE from front with one rail on L, 2STE from back  Lives alone, gets help with yardwork. Pt independent with housework  OCCUPATION: used to work for [employer] - not working since 4-5 years ago due to reported health issues   PLOF: Independent  PATIENT GOALS: wants to be able to exercise to lose weight  NEXT MD VISIT: TBD   OBJECTIVE: (objective measures completed at initial evaluation unless otherwise dated)   DIAGNOSTIC FINDINGS:  11/15/22 R knee XR indicative of tricompartmental arthritis, no acute fracture (per EPIC review)  08/26/22 L knee XR (refer to EPIC for details) Results narrative: "Radiographs of her left knee were reviewed today.  She does have varus  alignment with tricompartmental arthritis most notably in the medial  compartment with periarticular osteophytes loss of joint space and  sclerotic changes no acute fractures "  PATIENT SURVEYS:  FOTO 50 current, 58 predicted FOTO 01/18/23: 57   COGNITION: Overall cognitive status: Within functional limits for tasks assessed     SENSATION: Grossly intact BIL   EDEMA:  No appreciable swelling on observation, obscured by pt attire - she denies swelling issues   PALPATION: Concordant generalized tenderness about knee joints   LOWER EXTREMITY ROM:     Active  Right eval Left eval 01-20-23 R/L  Hip flexion     Hip extension     Hip internal rotation     Hip external rotation     Knee extension Full * Full * 0/0  Knee  flexion Seated: 96 * Seated: 90 deg * 124/115  (Blank rows = not tested) (Key: WFL = within functional limits not formally assessed, * = concordant pain, s = stiffness/stretching sensation, NT = not tested)  Comments:    LOWER EXTREMITY MMT:    MMT Right eval Left eval Left 01-20-23  Hip flexion 4 * 4* 4+  Hip abduction (modified sitting) 4+ 4+   Hip internal rotation     Hip external rotation     Knee flexion 4 * 3+ *   Knee extension 4 * 3+ *   Ankle dorsiflexion      (Blank rows = not tested) (Key: WFL = within functional limits not formally assessed, * = concordant pain, s = stiffness/stretching sensation, NT = not tested)  Comments:     LOWER EXTREMITY SPECIAL TESTS:  deferred  FUNCTIONAL TESTS:  TUG: 17.93sec  no AD, inc knee pain  GAIT: Distance walked: within clinic Assistive device utilized: None Level of assistance: Complete Independence Comments: reduced gait speed/cadence, widened BOS, reduced trunk rotation, reduced step length   TODAY'S TREATMENT:  [facility] Adult PT Treatment:                                                DATE: 01-25-23 Therapeutic Exercise: Updated HEP STS with 25 #  2 x 10 Straight Leg Raise with 5# cuff wt. 2 x 10 Seated Hamstring Stretch  2 reps - 30 sec hold Standing Heel Raises 2 x 10 reps BILStanding March with Unilateral  and 5 # cuff wt - 2 x 10 BILForward Step Up with Counter Support   with 5 # cuff weight  2 sets - 10 reps Bil Lateral Step Up with  5 # wt  2 sets - 10 reps Bil  Standing Partial Lunge   2  sets - 8 reps Standing Hip Abduction with 5 # cuff weight  2 sets - 10 reps Wall Slide with Posterior Pelvic Tilt 10 reps - 10 sec hold  Therapeutic Activity: Outside walking on inclines, curbs and up and down steps with education on increasing hip flexion to clear for ambulation Emphasizing walking program and answered questions.  Adding 2 x 15 min walks a day   [facility] Adult PT Treatment:                                                 DATE: 01-20-23 Therapeutic Exercise: STS standard chair 15 # KB cues for pacing Standing hip flexor stretch with UE support,  x8 BIL gentle rocking  SLR unresisted x15 BIL  Reverse lunge x 10 bil High march step over yoga block at 4inch height and 12 inch height at counter for UE support  Therapeutic Activity: Stand to floor transfer.  Simulated  floor to mat with  "simulated injured arm and simulated injured leg" Step up BW 6 inch x12 BIL LE Step down 4 inch  2x10 BIL LE  [facility] Adult PT Treatment:                                                DATE: 01/18/23 Therapeutic Exercise: STS standard chair, 2x8 weaning UE support cues for pacing LAQ x12 BIL unresisted, 4# x12 BIL cues for pacing SLR unresisted x15 BIL  Standing hip flexor stretch with UE support,  x8 BIL gentle rocking  HEP update + handout/education  Therapeutic Activity: Step up BW 6 inch x12 BIL LE Step up 4 inch 5# 2x10 BIL LE Lunge at counter, 2x8 BIL LE cues for mechanics and comfortable range FOTO + education   [facility] Adult PT Treatment:                                                DATE: 01-11-23 Therapeutic Exercise: Tall kneeling- vertical bridge 2 x 10 Step up on 6  inch leading with L LE  2 x 10 Step down on 2 inch L LE remaining on step  2x 10  LAQ 1 x 10 BIL LAQ 2 x  12  with 3 lb cuff weight Heel raise with bil 3 lb weights on  2 x 12 SLR with 3 # cuff weights 2 x 10 each side R hip flexor stretch Seated hamstring stretch (Kiss the grandma cue) Left knee  30 sec x 2 Manual Therapy: KT tape for left knee  2 I strips and 1 compression strip over tibial tuberosity Manual STW over quad and hamstring of left LE  Therapeutic Activity: Transfers from standing to tall kneeling x 4 Transfer from standing to supine on floor x 2 rising using stronger R LE in 1/2 kneeling  use of there ex pad for padding for knees  [facility] Adult PT Treatment:                                                DATE:  01-06-23 Therapeutic Exercise: Recumbent bike for 3  minutes but Left knee painful so stopped  LAQ 1 x 8 BIL LAQ 2 x 8 with 2 lb cuff weight Seated hamstring stretch (Kiss the grandma cue) Left knee  30 sec x 2 Seated heel slide w/ towel, 2x8 BIL  Standing heel raise at counter 2x12 cues for trunk posture  SLR 3x5 BIL cues for full quad contraction HEP Update for hamstring stretch Manual Therapy: KT tape for left knee  2 I strips and 1 compression strip over tibial tuberosity Manual STW over quad and hamstring of left LE    PATIENT EDUCATION:  Education details: rationale for interventions, HEP  Person educated: Patient Education method: Explanation, Demonstration, Tactile cues, Verbal cues, and Handouts Education comprehension: verbalized understanding, returned demonstration, verbal cues required, tactile cues required, and needs further education    HOME EXERCISE PROGRAM: Access Code: [email or web address] updated 01-25-23 URL: [email or web address] Date: 01/25/2023 Prepared by: Garen Lah  Exercises - sit to stand holding weight  - 2-3 x daily - 7 x weekly - 1 sets - 8 reps - Straight Leg Raise with Ankle Weight  - 1 x daily - 7 x weekly - 3 sets - 10 reps - Seated Hamstring Stretch  - 1-2 x daily - 7 x weekly - 1 sets - 3 reps - 30 sec hold - Standing Heel Raises  - 1 x daily - 7 x weekly - 3 sets - 10 reps - Standing March with Unilateral Counter Support  - 1 x daily - 7 x weekly - 3 sets - 10 reps - Forward Step Up with Counter Support  - 1 x daily - 7 x weekly - 3 sets - 10 reps - Lateral Step Up with Unilateral Counter Support  - 1 x daily - 7 x weekly - 3 sets - 10 reps - Standing Partial Lunge  - 1 x daily - 7 x weekly - 2 sets - 8 reps - Standing Hip Abduction with Ankle Weight  - 1 x daily - 7 x weekly - 3 sets - 10 reps - Wall Slide with Posterior Pelvic Tilt  - 1 x daily - 7 x weekly - 10 reps - 10 sec hold   ASSESSMENT:  CLINICAL IMPRESSION: 01/25/2023  Pt arrives  0/10 but she does admit  she fatigues  and fell yesterday after 20 min. She stated she was walking up a step and missed because she was tired.  Pt should continue exercise and HEP updated for closed chain and standing with progressive weights.  she is not any where near the pain level she was on eval and mostly just sore.  Today continuing to progress well with increased challenge for closed chain strengthening.  No adverse events, reports no pain at end of session. Pt departs today's session in no acute distress, all voiced questions/concerns addressed appropriately from PT perspective. Pt ready for DC after next visit.   Per eval - Patient is a very pleasant 62 y.o. woman who was seen today for physical therapy evaluation and treatment for chronic knee pain. Pt endorses ongoing difficulties with WB activities, knee mobility, and stair navigation/walking. On exam, pt demonstrates limitations in BIL knee mobility and strength, L more so than R. TUG time indicative of fall risk (see goal below). Does well with HEP/exam, mild symptom irritability but no increase in resting pain, no adverse events. Recommend trial of skilled PT to address aforementioned deficits with aim of improving functional tolerance and reducing pain with typical activities. Pt departs today's session in no acute distress, all voiced concerns/questions addressed appropriately from PT perspective.     OBJECTIVE IMPAIRMENTS: Abnormal gait, decreased activity tolerance, decreased endurance, decreased mobility, difficulty walking, decreased ROM, decreased strength, impaired perceived functional ability, improper body mechanics, and pain.   ACTIVITY LIMITATIONS: carrying, lifting, bending, standing, squatting, stairs, transfers, and locomotion level  PARTICIPATION LIMITATIONS: meal prep, cleaning, laundry, and community activity  PERSONAL FACTORS: Age, Time since onset of injury/illness/exacerbation, and 3+ comorbidities: DM, HTN,  depression  are also affecting patient's functional outcome.   REHAB POTENTIAL: Fair given chronicity/comorbidities  CLINICAL DECISION MAKING: Stable/uncomplicated  EVALUATION COMPLEXITY: Low   GOALS: Goals reviewed with patient? Yes  SHORT TERM GOALS: Target date: 01/20/2023 Pt will demonstrate appropriate understanding and performance of initially prescribed HEP in order to facilitate improved independence with management of symptoms.  Baseline: HEP provided on eval Goal status: MET  2. Pt will score greater than or equal to 54 on FOTO in order to demonstrate improved perception of function due to symptoms.  Baseline: 50  Goal status: INITIAL    LONG TERM GOALS: Target date: 02/17/2023 Pt will score 58 or greater on FOTO in order to demonstrate improved perception of function due to symptoms.  Baseline: 50 Goal status: INITIAL  2.  Pt will demonstrate at least 110 degrees of knee flexion AROM in order to facilitate improved tolerance to functional movements such as squatting/stairs.  Baseline: see ROM chart above Goal status: INITIAL  3.  Pt will demonstrate global LE MMT of at least 4/5 bilaterally for improved functional strength. Baseline: see MMT chart above Goal status: INITIAL  4.  Pt will be able to perform TUG in less than or equal to 13 sec in order to indicate reduced risk of falling (cutoff score for fall risk 13.5 sec in community dwelling older adults per [facility] et al, 2000)  Baseline: 17.93sec no AD  Goal status: INITIAL    5. Pt will demonstrate appropriate performance of final prescribed HEP in order to facilitate improved self-management of symptoms post-discharge.   Baseline: initial HEP prescribed  Goal status: INITIAL    6. Pt will report at least 50% decrease in overall pain levels in past week in order to facilitate improved tolerance to basic ADLs/mobility.   Baseline: 7-9/10  Goal status:  INITIAL     PLAN:  PT FREQUENCY: 2x/week  PT  DURATION: 8 weeks  PLANNED INTERVENTIONS: Therapeutic exercises, Therapeutic activity, Neuromuscular re-education, Balance training, Gait training, Patient/Family education, Self Care, Joint mobilization, Stair training, Aquatic Therapy, Dry Needling, Cryotherapy, Moist heat, Taping, Manual therapy, and Re-evaluation  PLAN FOR NEXT SESSION: Review/update HEP PRN. Work on [employer] exercises as appropriate with emphasis on open and closed chain LE strengthening, quad activation, and knee mobility. Symptom modification strategies as indicated/appropriate.     Garen Lah, PT, ATRIC Certified Exercise Expert for the Aging Adult  01/25/23 12:05 PM Phone: [phone] Fax: [phone]

## 2023-01-26 NOTE — Therapy (Addendum)
OUTPATIENT PHYSICAL THERAPY TREATMENT NOTE/DISCHARGE NOTE PHYSICAL THERAPY DISCHARGE SUMMARY  Visits from Start of Care: 8  Current functional level related to goals / functional outcomes: UNKNOWN   Remaining deficits: Last known  01/27/23   Education / Equipment: HEP   Patient agrees to discharge. Patient goals were partially met. Patient is being discharged due to not returning since the last visit.  Garen Lah, PT, [facility] Certified Exercise Expert for the Aging Adult  03/18/23 1:20 PM Phone: [phone] Fax: [phone]   Patient Name: [name] MRN: [id number] DOB:11-27-1960, 62 y.o., female Today's Date: 01/27/2023  END OF SESSION:  PT End of Session - 01/27/23 1416     Visit Number 8    Number of Visits 17    Date for PT Re-Evaluation 02/17/23    Authorization Type [employer]    Authorization Time Period 12 visits 12/27/22-02/11/23    Authorization - Visit Number 7    Authorization - Number of Visits 12    PT Start Time 1417    PT Stop Time 1500    PT Time Calculation (min) 43 min    Activity Tolerance Patient tolerated treatment well    Behavior During Therapy WFL for tasks assessed/performed                    Past Medical History:  Diagnosis Date   Acid reflux    Arthritis    Back pain    Depression    Diabetes mellitus without complication (HCC)    Edema, lower extremity    Glaucoma    High cholesterol    Hypertension    Joint pain    Nerve damage    Shortness of breath    Vitamin D deficiency    Past Surgical History:  Procedure Laterality Date   btl     CHOLECYSTECTOMY  2001   COLONOSCOPY WITH PROPOFOL N/A 01/06/2021   Procedure: COLONOSCOPY WITH PROPOFOL;  Surgeon: Benancio Deeds, MD;  Location: [facility];  Service: Gastroenterology;  Laterality: N/A;   POLYPECTOMY  01/06/2021   Procedure: POLYPECTOMY;  Surgeon: Benancio Deeds, MD;  Location: [facility];  Service: Gastroenterology;;   Patient  Active Problem List   Diagnosis Date Noted   Morbid obesity (HCC)-start bmi 51.47 05/03/2022   OSA (obstructive sleep apnea) 04/01/2022   Type 2 diabetes mellitus with obesity (HCC) 08/26/2021   At risk for heart disease 05/25/2021   Benign neoplasm of colon    Depression 06/02/2020   Other chronic pain 05/15/2020   SOB (shortness of breath) 04/05/2020   Hyperlipidemia associated with type 2 diabetes mellitus (HCC) 02/04/2020   Vitamin D deficiency 01/16/2020   Encounter for screening colonoscopy 01/16/2020   Major depressive disorder, recurrent episode, mild (HCC) 01/12/2020   Hypertension associated with type 2 diabetes mellitus (HCC) 01/02/2020   BMI 50.0-59.9, adult (HCC) 05/21/2019   Chronic pain of left knee 05/21/2019   Chronic pain of both knees 05/21/2019   Unilateral primary osteoarthritis, left knee 05/21/2019   Unilateral primary osteoarthritis, right knee 05/21/2019   ACROCHORDON 11/12/2009   PERIPHERAL EDEMA 08/01/2009   Pain in joint, lower leg 07/07/2009   SINUS TACHYCARDIA 04/09/2009   BACK PAIN, LUMBOSACRAL, CHRONIC 06/17/2008   SHOULDER PAIN, RIGHT, CHRONIC 05/21/2008   PERIPHERAL NEUROPATHY, MILD 03/05/2008   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   Diabetes mellitus (HCC) 05/19/2006   HYPERCHOLESTEROLEMIA 05/19/2006   OBESITY, NOS 05/19/2006   DEPRESSIVE DISORDER, NOS 05/19/2006   HYPERTENSION, BENIGN SYSTEMIC 05/19/2006  RHINITIS, ALLERGIC 05/19/2006   GASTROESOPHAGEAL REFLUX, NO ESOPHAGITIS 05/19/2006    PCP: Thana Ates, MD  REFERRING PROVIDER: Persons, [place], [place]  REFERRING DIAG: [phone] (ICD-10-CM) - Chronic pain of right knee (referral comment: eval and treat bilateral knees)  THERAPY DIAG:  Chronic pain of both knees  Muscle weakness (generalized)  Rationale for Evaluation and Treatment: Rehabilitation  ONSET DATE: a few years  SUBJECTIVE:  Per eval - Endorses gradual onset of pain over past few years, no MOI. Slowly worsening.  States she was told she has arthritis. States her L leg in particular will give out when fatigued, although this has improved.  Walks with her daughter 15 min in the morning and in the evening.   SUBJECTIVE STATEMENT: 01/27/2023 Pt is walking 20 min in AM and 15 min in PM.  And no pain   PERTINENT HISTORY: DM, HTN, SOB, depression, chronic pain multi-site PAIN:    Are you having pain today? See subjective section above  Per eval -  Location/description: L>R, medial and lateral joint lines, deep; feels like grinding Best-worst over past week: 7-9/10  - aggravating factors: stairs, walking >50min, getting down on floor - Easing factors: heated blanket, movement, medication  PRECAUTIONS: None  WEIGHT BEARING RESTRICTIONS: No  FALLS:  Has patient fallen in last 6 months? No  LIVING ENVIRONMENT: 1 level house, 5-6 STE from front with one rail on L, 2STE from back  Lives alone, gets help with yardwork. Pt independent with housework  OCCUPATION: used to work for [employer] - not working since 4-5 years ago due to reported health issues   PLOF: Independent  PATIENT GOALS: wants to be able to exercise to lose weight  NEXT MD VISIT: TBD   OBJECTIVE: (objective measures completed at initial evaluation unless otherwise dated)   DIAGNOSTIC FINDINGS:  11/15/22 R knee XR indicative of tricompartmental arthritis, no acute fracture (per EPIC review)  08/26/22 L knee XR (refer to EPIC for details) Results narrative: "Radiographs of her left knee were reviewed today.  She does have varus  alignment with tricompartmental arthritis most notably in the medial  compartment with periarticular osteophytes loss of joint space and  sclerotic changes no acute fractures "  PATIENT SURVEYS:  FOTO 50 current, 58 predicted FOTO 01/18/23: 57   COGNITION: Overall cognitive status: Within functional limits for tasks assessed     SENSATION: Grossly intact BIL   EDEMA:  No appreciable  swelling on observation, obscured by pt attire - she denies swelling issues   PALPATION: Concordant generalized tenderness about knee joints   LOWER EXTREMITY ROM:     Active  Right eval Left eval 01-20-23 R/L 01-27-23 AROM  Hip flexion      Hip extension      Hip internal rotation      Hip external rotation      Knee extension Full * Full * 0/0 0/0  Knee flexion Seated: 96 * Seated: 90 deg * 124/115 125/118  (Blank rows = not tested) (Key: WFL = within functional limits not formally assessed, * = concordant pain, s = stiffness/stretching sensation, NT = not tested)  Comments:    LOWER EXTREMITY MMT:    MMT Right eval Left eval Left 01-20-23  Hip flexion 4 * 4* 4+  Hip abduction (modified sitting) 4+ 4+   Hip internal rotation     Hip external rotation     Knee flexion 4 * 3+ *   Knee extension 4 * 3+ *  Ankle dorsiflexion      (Blank rows = not tested) (Key: WFL = within functional limits not formally assessed, * = concordant pain, s = stiffness/stretching sensation, NT = not tested)  Comments:     LOWER EXTREMITY SPECIAL TESTS:  deferred  FUNCTIONAL TESTS:  TUG: 17.93sec no AD, inc knee pain  GAIT: Distance walked: within clinic Assistive device utilized: None Level of assistance: Complete Independence Comments: reduced gait speed/cadence, widened BOS, reduced trunk rotation, reduced step length   TODAY'S TREATMENT:  [facility] Adult PT Treatment:                                                DATE: 11--7-24  Knee flexion  ext /flex  R 0/125  left 0 -118 Therapeutic Exercise: Nu step level 4 x 6 min wht UE/LE STS with 30 #  2 x 10 6 inch step ups  2 x 10 with Right and Left carrying 10 # DB LAQ  2 x 10 each on Right and Left with 5 # cuff weight Marching with high step 2 x 10 with 5 # cuff weights  cues to step high BIL Straight Leg Raise with 5# cuff wt. 3 x 10 Hamstring stretch  2 x 30 sec Bil with Green strap ITB  2 x 30 sec Bil with Green strap Wall  slide  10 x 10 sec hold SL stance with RTB banded around thighs clamshell  [facility] Adult PT Treatment:                                                DATE: 01-25-23 Therapeutic Exercise: Updated HEP STS with 25 #  2 x 10 Straight Leg Raise with 5# cuff wt. 2 x 10 Seated Hamstring Stretch  2 reps - 30 sec hold Standing Heel Raises 2 x 10 reps BILStanding March with Unilateral  and 5 # cuff wt - 2 x 10 BILForward Step Up with Counter Support   with 5 # cuff weight  2 sets - 10 reps Bil Lateral Step Up with  5 # wt  2 sets - 10 reps Bil  Standing Partial Lunge   2  sets - 8 reps Standing Hip Abduction with 5 # cuff weight  2 sets - 10 reps Wall Slide with Posterior Pelvic Tilt 10 reps - 10 sec hold  Therapeutic Activity: Outside walking on inclines, curbs and up and down steps with education on increasing hip flexion to clear for ambulation Emphasizing walking program and answered questions.  Adding 2 x 15 min walks a day   [facility] Adult PT Treatment:                                                DATE: 01-20-23 Therapeutic Exercise: STS standard chair 15 # KB cues for pacing Standing hip flexor stretch with UE support,  x8 BIL gentle rocking  SLR unresisted x15 BIL  Reverse lunge x 10 bil High march step over yoga block at 4inch height and 12 inch height at counter for UE support  Therapeutic Activity: Stand to  floor transfer.  Simulated  floor to mat with  "simulated injured arm and simulated injured leg" Step up BW 6 inch x12 BIL LE Step down 4 inch  2x10 BIL LE  [facility] Adult PT Treatment:                                                DATE: 01/18/23 Therapeutic Exercise: STS standard chair, 2x8 weaning UE support cues for pacing LAQ x12 BIL unresisted, 4# x12 BIL cues for pacing SLR unresisted x15 BIL  Standing hip flexor stretch with UE support,  x8 BIL gentle rocking  HEP update + handout/education  Therapeutic Activity: Step up BW 6 inch x12 BIL LE Step up 4 inch 5# 2x10 BIL  LE Lunge at counter, 2x8 BIL LE cues for mechanics and comfortable range FOTO + education   [facility] Adult PT Treatment:                                                DATE: 01-11-23 Therapeutic Exercise: Tall kneeling- vertical bridge 2 x 10 Step up on 6 inch leading with L LE  2 x 10 Step down on 2 inch L LE remaining on step  2x 10  LAQ 1 x 10 BIL LAQ 2 x  12  with 3 lb cuff weight Heel raise with bil 3 lb weights on  2 x 12 SLR with 3 # cuff weights 2 x 10 each side R hip flexor stretch Seated hamstring stretch (Kiss the grandma cue) Left knee  30 sec x 2 Manual Therapy: KT tape for left knee  2 I strips and 1 compression strip over tibial tuberosity Manual STW over quad and hamstring of left LE  Therapeutic Activity: Transfers from standing to tall kneeling x 4 Transfer from standing to supine on floor x 2 rising using stronger R LE in 1/2 kneeling  use of there ex pad for padding for knees  [facility] Adult PT Treatment:                                                DATE: 01-06-23 Therapeutic Exercise: Recumbent bike for 3  minutes but Left knee painful so stopped  LAQ 1 x 8 BIL LAQ 2 x 8 with 2 lb cuff weight Seated hamstring stretch (Kiss the grandma cue) Left knee  30 sec x 2 Seated heel slide w/ towel, 2x8 BIL  Standing heel raise at counter 2x12 cues for trunk posture  SLR 3x5 BIL cues for full quad contraction HEP Update for hamstring stretch Manual Therapy: KT tape for left knee  2 I strips and 1 compression strip over tibial tuberosity Manual STW over quad and hamstring of left LE    PATIENT EDUCATION:  Education details: rationale for interventions, HEP  Person educated: Patient Education method: Explanation, Demonstration, Tactile cues, Verbal cues, and Handouts Education comprehension: verbalized understanding, returned demonstration, verbal cues required, tactile cues required, and needs further education    HOME EXERCISE PROGRAM: Access Code: [email or web address] updated  01-25-23 URL: [email or web address] Date: 01/25/2023 Prepared by: Garen Lah  Exercises -  sit to stand holding weight  - 2-3 x daily - 7 x weekly - 1 sets - 8 reps - Straight Leg Raise with Ankle Weight  - 1 x daily - 7 x weekly - 3 sets - 10 reps - Seated Hamstring Stretch  - 1-2 x daily - 7 x weekly - 1 sets - 3 reps - 30 sec hold - Standing Heel Raises  - 1 x daily - 7 x weekly - 3 sets - 10 reps - Standing March with Unilateral Counter Support  - 1 x daily - 7 x weekly - 3 sets - 10 reps - Forward Step Up with Counter Support  - 1 x daily - 7 x weekly - 3 sets - 10 reps - Lateral Step Up with Unilateral Counter Support  - 1 x daily - 7 x weekly - 3 sets - 10 reps - Standing Partial Lunge  - 1 x daily - 7 x weekly - 2 sets - 8 reps - Standing Hip Abduction with Ankle Weight  - 1 x daily - 7 x weekly - 3 sets - 10 reps - Wall Slide with Posterior Pelvic Tilt  - 1 x daily - 7 x weekly - 10 reps - 10 sec hold   ASSESSMENT:  CLINICAL IMPRESSION: 01/27/2023 Pt arrives  0/10  and pt walking 20 min in morning and 15 min in evening. Pt is making good progress and gaining strength as she is able to perform exercise with increasing weights.  Pt knee AROM improved  Knee flexion  ext /flex  R 0/125  left 0 -118 . Pt will be able to do FOTO next week to assess goals.  Today continuing to progress well with increased challenge for closed chain strengthening.  No adverse events, reports no pain at end of session. Pt departs today's session in no acute distress, all voiced questions/concerns addressed appropriately from PT perspective. Pt ready for DC after next visit.   Per eval - Patient is a very pleasant 62 y.o. woman who was seen today for physical therapy evaluation and treatment for chronic knee pain. Pt endorses ongoing difficulties with WB activities, knee mobility, and stair navigation/walking. On exam, pt demonstrates limitations in BIL knee mobility and strength, L more so  than R. TUG time indicative of fall risk (see goal below). Does well with HEP/exam, mild symptom irritability but no increase in resting pain, no adverse events. Recommend trial of skilled PT to address aforementioned deficits with aim of improving functional tolerance and reducing pain with typical activities. Pt departs today's session in no acute distress, all voiced concerns/questions addressed appropriately from PT perspective.     OBJECTIVE IMPAIRMENTS: Abnormal gait, decreased activity tolerance, decreased endurance, decreased mobility, difficulty walking, decreased ROM, decreased strength, impaired perceived functional ability, improper body mechanics, and pain.   ACTIVITY LIMITATIONS: carrying, lifting, bending, standing, squatting, stairs, transfers, and locomotion level  PARTICIPATION LIMITATIONS: meal prep, cleaning, laundry, and community activity  PERSONAL FACTORS: Age, Time since onset of injury/illness/exacerbation, and 3+ comorbidities: DM, HTN, depression  are also affecting patient's functional outcome.   REHAB POTENTIAL: Fair given chronicity/comorbidities  CLINICAL DECISION MAKING: Stable/uncomplicated  EVALUATION COMPLEXITY: Low   GOALS: Goals reviewed with patient? Yes  SHORT TERM GOALS: Target date: 01/20/2023 Pt will demonstrate appropriate understanding and performance of initially prescribed HEP in order to facilitate improved independence with management of symptoms.  Baseline: HEP provided on eval Goal status: MET  2. Pt will score greater than or equal to  54 on FOTO in order to demonstrate improved perception of function due to symptoms.  Baseline: 50  Goal status: INITIAL    LONG TERM GOALS: Target date: 02/17/2023 Pt will score 58 or greater on FOTO in order to demonstrate improved perception of function due to symptoms.  Baseline: 50 Goal status: INITIAL  2.  Pt will demonstrate at least 110 degrees of knee flexion AROM in order to facilitate  improved tolerance to functional movements such as squatting/stairs.  Baseline: see ROM chart above Goal status: INITIAL  3.  Pt will demonstrate global LE MMT of at least 4/5 bilaterally for improved functional strength. Baseline: see MMT chart above Goal status: INITIAL  4.  Pt will be able to perform TUG in less than or equal to 13 sec in order to indicate reduced risk of falling (cutoff score for fall risk 13.5 sec in community dwelling older adults per [facility] et al, 2000)  Baseline: 17.93sec no AD  Goal status: INITIAL    5. Pt will demonstrate appropriate performance of final prescribed HEP in order to facilitate improved self-management of symptoms post-discharge.   Baseline: initial HEP prescribed  Goal status: INITIAL    6. Pt will report at least 50% decrease in overall pain levels in past week in order to facilitate improved tolerance to basic ADLs/mobility.   Baseline: 7-9/10  Goal status: INITIAL     PLAN:  PT FREQUENCY: 2x/week  PT DURATION: 8 weeks  PLANNED INTERVENTIONS: Therapeutic exercises, Therapeutic activity, Neuromuscular re-education, Balance training, Gait training, Patient/Family education, Self Care, Joint mobilization, Stair training, Aquatic Therapy, Dry Needling, Cryotherapy, Moist heat, Taping, Manual therapy, and Re-evaluation  PLAN FOR NEXT SESSION: Review/update HEP PRN. Work on [employer] exercises as appropriate with emphasis on open and closed chain LE strengthening, quad activation, and knee mobility. Symptom modification strategies as indicated/appropriate.   FOTO next week 9 or 10 visit  Garen Lah, PT, [facility] Certified Exercise Expert for the Aging Adult  01/27/23 4:08 PM Phone: [phone] Fax: [phone]

## 2023-01-27 ENCOUNTER — Encounter: Payer: Self-pay | Admitting: Physical Therapy

## 2023-01-27 ENCOUNTER — Ambulatory Visit: Payer: Medicare HMO | Admitting: Physical Therapy

## 2023-01-27 DIAGNOSIS — M25561 Pain in right knee: Secondary | ICD-10-CM | POA: Diagnosis not present

## 2023-01-27 DIAGNOSIS — M25562 Pain in left knee: Secondary | ICD-10-CM | POA: Diagnosis not present

## 2023-01-27 DIAGNOSIS — G8929 Other chronic pain: Secondary | ICD-10-CM | POA: Diagnosis not present

## 2023-01-27 DIAGNOSIS — M6281 Muscle weakness (generalized): Secondary | ICD-10-CM | POA: Diagnosis not present

## 2023-02-14 ENCOUNTER — Other Ambulatory Visit: Payer: Self-pay | Admitting: Internal Medicine

## 2023-02-14 DIAGNOSIS — H40053 Ocular hypertension, bilateral: Secondary | ICD-10-CM | POA: Diagnosis not present

## 2023-02-14 DIAGNOSIS — Z1231 Encounter for screening mammogram for malignant neoplasm of breast: Secondary | ICD-10-CM

## 2023-03-14 ENCOUNTER — Ambulatory Visit (INDEPENDENT_AMBULATORY_CARE_PROVIDER_SITE_OTHER): Payer: Medicare HMO | Admitting: Family Medicine

## 2023-03-20 ENCOUNTER — Other Ambulatory Visit (INDEPENDENT_AMBULATORY_CARE_PROVIDER_SITE_OTHER): Payer: Self-pay | Admitting: Family Medicine

## 2023-03-20 DIAGNOSIS — E1169 Type 2 diabetes mellitus with other specified complication: Secondary | ICD-10-CM

## 2023-03-28 ENCOUNTER — Ambulatory Visit (INDEPENDENT_AMBULATORY_CARE_PROVIDER_SITE_OTHER): Payer: Medicare HMO | Admitting: Family Medicine

## 2023-04-12 ENCOUNTER — Ambulatory Visit (INDEPENDENT_AMBULATORY_CARE_PROVIDER_SITE_OTHER): Payer: Medicare HMO | Admitting: Family Medicine

## 2023-04-12 DIAGNOSIS — Z0100 Encounter for examination of eyes and vision without abnormal findings: Secondary | ICD-10-CM | POA: Diagnosis not present

## 2023-04-12 DIAGNOSIS — H52223 Regular astigmatism, bilateral: Secondary | ICD-10-CM | POA: Diagnosis not present

## 2023-04-13 ENCOUNTER — Ambulatory Visit (INDEPENDENT_AMBULATORY_CARE_PROVIDER_SITE_OTHER): Payer: Medicare HMO | Admitting: Family Medicine

## 2023-04-13 ENCOUNTER — Encounter (INDEPENDENT_AMBULATORY_CARE_PROVIDER_SITE_OTHER): Payer: Self-pay | Admitting: Family Medicine

## 2023-04-13 VITALS — BP 133/61 | HR 71 | Temp 98.8°F | Ht 61.0 in | Wt 258.0 lb

## 2023-04-13 DIAGNOSIS — I152 Hypertension secondary to endocrine disorders: Secondary | ICD-10-CM | POA: Diagnosis not present

## 2023-04-13 DIAGNOSIS — Z7984 Long term (current) use of oral hypoglycemic drugs: Secondary | ICD-10-CM

## 2023-04-13 DIAGNOSIS — Z794 Long term (current) use of insulin: Secondary | ICD-10-CM | POA: Diagnosis not present

## 2023-04-13 DIAGNOSIS — E1159 Type 2 diabetes mellitus with other circulatory complications: Secondary | ICD-10-CM | POA: Diagnosis not present

## 2023-04-13 DIAGNOSIS — M545 Low back pain, unspecified: Secondary | ICD-10-CM | POA: Diagnosis not present

## 2023-04-13 DIAGNOSIS — G4733 Obstructive sleep apnea (adult) (pediatric): Secondary | ICD-10-CM

## 2023-04-13 DIAGNOSIS — E1169 Type 2 diabetes mellitus with other specified complication: Secondary | ICD-10-CM

## 2023-04-13 DIAGNOSIS — Z6841 Body Mass Index (BMI) 40.0 and over, adult: Secondary | ICD-10-CM

## 2023-04-13 DIAGNOSIS — E559 Vitamin D deficiency, unspecified: Secondary | ICD-10-CM | POA: Diagnosis not present

## 2023-04-13 DIAGNOSIS — G8929 Other chronic pain: Secondary | ICD-10-CM

## 2023-04-13 DIAGNOSIS — Z7985 Long-term (current) use of injectable non-insulin antidiabetic drugs: Secondary | ICD-10-CM

## 2023-04-13 MED ORDER — ERGOCALCIFEROL 1.25 MG (50000 UT) PO CAPS: ORAL_CAPSULE | ORAL | 0 refills | Status: DC

## 2023-04-13 MED ORDER — SEMAGLUTIDE (2 MG/DOSE) 8 MG/3ML ~~LOC~~ SOPN: 2.0000 mg | PEN_INJECTOR | SUBCUTANEOUS | 0 refills | Status: DC

## 2023-04-13 NOTE — Progress Notes (Signed)
[name], D.O.  ABFM, ABOM Specializing in Clinical Bariatric Medicine  Office located at: [address] [place], [place]  [address]   Assessment and Plan:   Pt agreed to bring her latest labs from her PCP to the office given the last labs that are currently available are from 08/09/22. Pt also plans to get more labs done soon with PCP, will also bring those to the office.   FOR THE DISEASE OF OBESITY: BMI 45.0-49.9, adult (HCC)- current BMI 48.77 Morbid obesity (HCC) Assessment & Plan: Since last office visit on 12/13/22 patient's muscle mass has decreased by 6.8lb. Fat mass has increased by 8.4lb. Total body water has decreased by 0.4lb.  Counseling done on how various foods will affect these numbers and how to maximize success  Total lbs lost to date: 8 lbs Total weight loss percentage to date: -3.01    Recommended Dietary Goals [name] is currently in the action stage of change. As such, her goal is to continue weight management plan.  She has agreed to: continue current plan   Behavioral Intervention We discussed the following today: increasing lean protein intake to established goals, increasing water intake , avoiding temptations and identifying enticing environmental cues, continue to work on maintaining a reduced calorie state, getting the recommended amount of protein, incorporating whole foods, making healthy choices, staying well hydrated and practicing mindfulness when eating., and using GPT or another AI platform for recipe ideas- searching "low calorie, low carb, high protein chicken recipes" etc  Additional resources provided today: Handout on complex carbohydrates and lean sources of protein  Evidence-based interventions for health behavior change were utilized today including the discussion of self monitoring techniques, problem-solving barriers and SMART goal setting techniques.   Regarding patient's less desirable eating habits and patterns, we  employed the technique of small changes.   Pt will specifically work on: Talk to PCP and chronic pain specialist about the possibilities of switching off obesogenic meds (Lopressor and Gabapentin) and use CPAP nightly  for next visit.    Recommended Physical Activity Goals [name] has been advised to work up to 150 minutes of moderate intensity aerobic activity a week and strengthening exercises 2-3 times per week for cardiovascular health, weight loss maintenance and preservation of muscle mass.   She has agreed to :  Think about enjoyable ways to increase daily physical activity and overcoming barriers to exercise and Increase physical activity in their day and reduce sedentary time (increase NEAT).   Pharmacotherapy We discussed various medication options to help [name] with her weight loss efforts and we both agreed to: continue with nutritional and behavioral strategies and adequate clinical response to current dose, continue current regimen  FOR ASSOCIATED CONDITIONS ADDRESSED TODAY: Type 2 diabetes mellitus with other specified complication, with long-term current use of insulin [facility]) Assessment & Plan: Lab Results  Component Value Date   HGBA1C 6.8 (H) 08/09/2022   HGBA1C 7.0 (H) 05/03/2022   HGBA1C 6.6 (H) 08/26/2021   INSULIN 23.8 05/03/2022   INSULIN 10.1 04/29/2021    Pt is currently taking Lantus 30 units in AM and 15 units at night, was recently decreased by PCP. Also on Metformin Ozempic 2 mg once weekly on Fridays. Tolerating all medications well, no SE. Hunger/cravings are well controlled. Pt has been limiting her portion sizes at meal times.   Continue with current regimen as directed by PCP/specialists. Pt advised to continue working on following her meal plan and prioritizing her protein intake. Decrease  simple carbs/sugars. Will continue to monitor her condition as it relates to her weight loss journey.   Orders: - Refill Ozempic, no dose changes   OSA on  CPAP Assessment & Plan: Per pt, she was told she doesn't have to use CPAP nightly and was instructed to use it every other night. Pt reports her last follow up with Dr. Betti Cruz was on 12/22/22. Pt believes her next follow up is in 1-2 months.  Educated pt on weight loss being more difficult when OSA is uncontrolled. I recommend she use her CPAP nightly. Advised pt to revisit the discussion of the frequency in which she should use her CPAP with Dr. Betti Cruz at her next visit.    Hypertension associated with type 2 diabetes mellitus [facility]) Assessment & Plan: BP Readings from Last 3 Encounters:  04/13/23 133/61  12/13/22 112/74  11/01/22 127/83   BP is well controlled. Pt currently compliant with Accuretic 20-25 mg once daily and Lopressor 50 BID. Tolerating well, no side effects.   Continue to follow heart healthy diet per her prudent nutritional meal plan. Counseled pt on how beta-blockers such as Lopressor make it difficult for her to lose weight. I recommend she discuss with her PCP about different alternatives for Lopressor, to a medication that is not obesogenic. Continue with current regimen for now, until further discussed with PCP.    Chronic bilateral low back pain without sciatica Assessment & Plan: Pt has hx of chronic back pain in the middle of her back, bilaterally. Pain does not radiate down her leg. Pain is currently managed with gabapentin 600 mg once TID. Tolerating well, no reported SE.   Discussed how gabapentin is also an obesogenic drug (pt also on Lopressor), which can make weight loss more difficult. I recommend pt discuss with her chronic pain management provider/team about the possibility of alternative medications that are not obesogenic. Continue with current regimen as directed until able to further discuss with pain management care team.    Vitamin D deficiency Assessment & Plan: Lab Results  Component Value Date   VD25OH 51.3 08/09/2022   VD25OH 52.7 05/03/2022    VD25OH 64.9 08/26/2021   Pt has been taking vitamin D OTC once daily, is unsure of exact dosing. Vitamin D was last refilled on 12/10/22, pt was unable to pick up from her pharmacy. Will refill ERGO again for pt to resume supplementation. Pt to bring last labs obtained from PCP to her next OV to be reviewed prior to next refill.   Orders: - Refill ERGO, no dose changes.    Follow up:   Return in about 3 months (around 07/12/2023). She was informed of the importance of frequent follow up visits to maximize her success with intensive lifestyle modifications for her multiple health conditions.  Subjective:   Chief complaint: Obesity [name] is here to discuss her progress with her obesity treatment plan. She is on the the Category 2 Plan and states she is following her eating plan approximately 85% of the time. She states she is walking 30 minutes 5 days per week.  Interval History:  [name] is here for a follow up office visit. Since last OV, she is up 2 lbs. Pt reports eating [place] sausage, 2 eggs, 45 cal bread, and cheese ([place]) for breakfast. Has chicken and [place] meat for sandwiches for lunch. Snacks on baked chips. Has cravings for chicken.    Barriers identified: presence of obesogenic drugs.   Pharmacotherapy for weight loss: She is  currently taking Metformin (off label use for incretin effect and / or insulin resistance and / or diabetes prevention) with adequate clinical response  and without side effects. and Ozempic with diabetes as the primary indication with adequate clinical response  and without side effects..   Review of Systems:  Pertinent positives were addressed with patient today.  Reviewed by clinician on day of visit: allergies, medications, problem list, medical history, surgical history, family history, social history, and previous encounter notes.  Weight Summary and Biometrics   Weight Lost Since Last Visit: 0  Weight Gained Since Last Visit: 2  lb    Vitals Temp: 98.8 F (37.1 C) BP: 133/61 Pulse Rate: 71 SpO2: 98 %   Anthropometric Measurements Height: 5\' 1"  (1.549 m) Weight: 258 lb (117 kg) BMI (Calculated): 48.77 Weight at Last Visit: 256 lb Weight Lost Since Last Visit: 0 Weight Gained Since Last Visit: 2 lb Starting Weight: 266 lb Total Weight Loss (lbs): 8 lb (3.629 kg)   Body Composition  Body Fat %: 54.2 % Fat Mass (lbs): 139.8 lbs Muscle Mass (lbs): 112.2 lbs Total Body Water (lbs): 86.8 lbs Visceral Fat Rating : 21   Other Clinical Data Fasting: no Labs: no Today's Visit #: 49 Starting Date: 09/18/19    Objective:   PHYSICAL EXAM: Blood pressure 133/61, pulse 71, temperature 98.8 F (37.1 C), height 5\' 1"  (1.549 m), weight 258 lb (117 kg), SpO2 98%. Body mass index is 48.75 kg/m.  General: she is overweight, cooperative and in no acute distress. PSYCH: Has normal mood, affect and thought process.   HEENT: EOMI, sclerae are anicteric. Lungs: Normal breathing effort, no conversational dyspnea. Extremities: Moves * 4 Neurologic: A and O * 3, good insight  DIAGNOSTIC DATA REVIEWED: BMET    Component Value Date/Time   NA 140 08/09/2022 1457   K 4.2 08/09/2022 1457   CL 101 08/09/2022 1457   CO2 27 08/09/2022 1457   GLUCOSE 44 (L) 08/09/2022 1457   GLUCOSE 110 (H) 03/02/2015 1534   BUN 21 08/09/2022 1457   CREATININE 0.96 08/09/2022 1457   CALCIUM 10.1 08/09/2022 1457   GFRNONAA 66 10/27/2020 0000   GFRAA 77 10/27/2020 0000   Lab Results  Component Value Date   HGBA1C 6.8 (H) 08/09/2022   HGBA1C 9.1 06/27/2006   Lab Results  Component Value Date   INSULIN 23.8 05/03/2022   INSULIN 10.1 04/29/2021   Lab Results  Component Value Date   TSH 1.320 04/29/2021   CBC    Component Value Date/Time   WBC 6.4 10/27/2020 0000   WBC 6.7 03/02/2015 1534   RBC 5.17 (A) 10/27/2020 0000   HGB 13.9 10/27/2020 0000   HGB 15.3 09/18/2019 1455   HCT 44 10/27/2020 0000   HCT 47.5 (H)  09/18/2019 1455   PLT 283 10/27/2020 0000   PLT 312 09/18/2019 1455   MCV 85 09/18/2019 1455   MCH 27.4 09/18/2019 1455   MCH 27.0 03/02/2015 1534   MCHC 32.2 09/18/2019 1455   MCHC 31.3 03/02/2015 1534   RDW 13.5 09/18/2019 1455   Iron Studies No results found for: "IRON", "TIBC", "FERRITIN", "IRONPCTSAT" Lipid Panel     Component Value Date/Time   CHOL 145 05/03/2022 1018   TRIG 127 05/03/2022 1018   HDL 44 05/03/2022 1018   CHOLHDL 3.3 05/03/2022 1018   CHOLHDL 2.6 Ratio 06/27/2008 2132   VLDL 23 06/27/2008 2132   LDLCALC 78 05/03/2022 1018   LDLDIRECT 66 04/09/2009 2134  Hepatic Function Panel     Component Value Date/Time   PROT 7.9 05/03/2022 1018   ALBUMIN 4.4 05/03/2022 1018   AST 16 05/03/2022 1018   ALT 14 05/03/2022 1018   ALKPHOS 98 05/03/2022 1018   BILITOT 0.5 05/03/2022 1018      Component Value Date/Time   TSH 1.320 04/29/2021 0849   Nutritional Lab Results  Component Value Date   VD25OH 51.3 08/09/2022   VD25OH 52.7 05/03/2022   VD25OH 64.9 08/26/2021    Attestations:   I, Isabelle Course, acting as a medical scribe for Thomasene Lot, DO., have compiled all relevant documentation for today's office visit on behalf of Thomasene Lot, DO, while in the presence of [employer], DO.  Reviewed by clinician on day of visit: allergies, medications, problem list, medical history, surgical history, family history, social history, and previous encounter notes pertinent to patient's obesity diagnosis.  I have spent 43 minutes in the care of the patient today including: preparing to see patient (e.g. review and interpretation of tests, old notes ), obtaining and/or reviewing separately obtained history, performing a medically appropriate examination or evaluation, counseling and educating the patient, ordering medications, test or procedures, documenting clinical information in the electronic or other health care record, and independently interpreting  results and communicating results to the patient, family, or caregiver   I have reviewed the above documentation for accuracy and completeness, and I agree with the above. [name], D.O.  The 21st Century Cures Act was signed into law in 2016 which includes the topic of electronic health records.  This provides immediate access to information in MyChart.  This includes consultation notes, operative notes, office notes, lab results and pathology reports.  If you have any questions about what you read please let us know at your next visit so we can discuss your concerns and take corrective action if need be.  We are right here with you.

## 2023-04-14 ENCOUNTER — Encounter (INDEPENDENT_AMBULATORY_CARE_PROVIDER_SITE_OTHER): Payer: Self-pay | Admitting: Family Medicine

## 2023-04-14 DIAGNOSIS — E113293 Type 2 diabetes mellitus with mild nonproliferative diabetic retinopathy without macular edema, bilateral: Secondary | ICD-10-CM | POA: Diagnosis not present

## 2023-04-14 DIAGNOSIS — I10 Essential (primary) hypertension: Secondary | ICD-10-CM | POA: Diagnosis not present

## 2023-04-14 DIAGNOSIS — E1142 Type 2 diabetes mellitus with diabetic polyneuropathy: Secondary | ICD-10-CM | POA: Diagnosis not present

## 2023-04-14 LAB — BASIC METABOLIC PANEL
BUN: 24 — AB (ref 4–21)
CO2: 33 — AB (ref 13–22)
Chloride: 100 (ref 99–108)
Creatinine: 0.9 (ref 0.5–1.1)
Glucose: 81
Potassium: 4 meq/L (ref 3.5–5.1)
Sodium: 141 (ref 137–147)

## 2023-04-14 LAB — LIPID PANEL
Cholesterol: 154 (ref 0–200)
HDL: 53 (ref 35–70)
LDL Cholesterol: 79
LDl/HDL Ratio: 2.9
Triglycerides: 119 (ref 40–160)

## 2023-04-14 LAB — CBC AND DIFFERENTIAL
HCT: 46 (ref 36–46)
Hemoglobin: 14.8 (ref 12.0–16.0)
Neutrophils Absolute: 3.4
Platelets: 293 10*3/uL (ref 150–400)
WBC: 6.9

## 2023-04-14 LAB — VITAMIN D 25 HYDROXY (VIT D DEFICIENCY, FRACTURES): Vit D, 25-Hydroxy: 42.9

## 2023-04-14 LAB — COMPREHENSIVE METABOLIC PANEL
Albumin: 4.1 (ref 3.5–5.0)
Calcium: 10.1 (ref 8.7–10.7)
eGFR: 74

## 2023-04-14 LAB — HEPATIC FUNCTION PANEL
ALT: 21 U/L (ref 7–35)
AST: 15 (ref 13–35)
Bilirubin, Total: 0.8

## 2023-04-14 LAB — CBC: RBC: 5.48 — AB (ref 3.87–5.11)

## 2023-04-14 LAB — HEMOGLOBIN A1C
Hemoglobin A1C: 7.4
Hemoglobin A1C: 7.4

## 2023-04-25 ENCOUNTER — Other Ambulatory Visit (INDEPENDENT_AMBULATORY_CARE_PROVIDER_SITE_OTHER): Payer: Self-pay

## 2023-05-01 ENCOUNTER — Other Ambulatory Visit (INDEPENDENT_AMBULATORY_CARE_PROVIDER_SITE_OTHER): Payer: Self-pay | Admitting: Family Medicine

## 2023-05-01 DIAGNOSIS — E559 Vitamin D deficiency, unspecified: Secondary | ICD-10-CM

## 2023-05-09 ENCOUNTER — Ambulatory Visit
Admission: RE | Admit: 2023-05-09 | Discharge: 2023-05-09 | Disposition: A | Discharge status: Home / Self Care | Payer: Medicare HMO | Source: Ambulatory Visit | Attending: Internal Medicine | Admitting: Internal Medicine

## 2023-05-09 DIAGNOSIS — Z1231 Encounter for screening mammogram for malignant neoplasm of breast: Secondary | ICD-10-CM | POA: Diagnosis not present

## 2023-06-08 DIAGNOSIS — G4733 Obstructive sleep apnea (adult) (pediatric): Secondary | ICD-10-CM | POA: Diagnosis not present

## 2023-06-08 DIAGNOSIS — M1990 Unspecified osteoarthritis, unspecified site: Secondary | ICD-10-CM | POA: Diagnosis not present

## 2023-06-08 DIAGNOSIS — J302 Other seasonal allergic rhinitis: Secondary | ICD-10-CM | POA: Diagnosis not present

## 2023-06-08 DIAGNOSIS — F419 Anxiety disorder, unspecified: Secondary | ICD-10-CM | POA: Diagnosis not present

## 2023-06-08 DIAGNOSIS — Z794 Long term (current) use of insulin: Secondary | ICD-10-CM | POA: Diagnosis not present

## 2023-06-08 DIAGNOSIS — R32 Unspecified urinary incontinence: Secondary | ICD-10-CM | POA: Diagnosis not present

## 2023-06-08 DIAGNOSIS — E559 Vitamin D deficiency, unspecified: Secondary | ICD-10-CM | POA: Diagnosis not present

## 2023-06-08 DIAGNOSIS — Z6841 Body Mass Index (BMI) 40.0 and over, adult: Secondary | ICD-10-CM | POA: Diagnosis not present

## 2023-06-08 DIAGNOSIS — E11319 Type 2 diabetes mellitus with unspecified diabetic retinopathy without macular edema: Secondary | ICD-10-CM | POA: Diagnosis not present

## 2023-06-08 DIAGNOSIS — N182 Chronic kidney disease, stage 2 (mild): Secondary | ICD-10-CM | POA: Diagnosis not present

## 2023-06-08 DIAGNOSIS — K08409 Partial loss of teeth, unspecified cause, unspecified class: Secondary | ICD-10-CM | POA: Diagnosis not present

## 2023-06-08 DIAGNOSIS — I209 Angina pectoris, unspecified: Secondary | ICD-10-CM | POA: Diagnosis not present

## 2023-06-08 DIAGNOSIS — E1122 Type 2 diabetes mellitus with diabetic chronic kidney disease: Secondary | ICD-10-CM | POA: Diagnosis not present

## 2023-06-08 DIAGNOSIS — K219 Gastro-esophageal reflux disease without esophagitis: Secondary | ICD-10-CM | POA: Diagnosis not present

## 2023-06-08 DIAGNOSIS — E785 Hyperlipidemia, unspecified: Secondary | ICD-10-CM | POA: Diagnosis not present

## 2023-06-08 DIAGNOSIS — B358 Other dermatophytoses: Secondary | ICD-10-CM | POA: Diagnosis not present

## 2023-06-08 DIAGNOSIS — E1142 Type 2 diabetes mellitus with diabetic polyneuropathy: Secondary | ICD-10-CM | POA: Diagnosis not present

## 2023-06-08 DIAGNOSIS — M545 Low back pain, unspecified: Secondary | ICD-10-CM | POA: Diagnosis not present

## 2023-06-08 DIAGNOSIS — I129 Hypertensive chronic kidney disease with stage 1 through stage 4 chronic kidney disease, or unspecified chronic kidney disease: Secondary | ICD-10-CM | POA: Diagnosis not present

## 2023-06-08 DIAGNOSIS — H409 Unspecified glaucoma: Secondary | ICD-10-CM | POA: Diagnosis not present

## 2023-07-12 ENCOUNTER — Ambulatory Visit (INDEPENDENT_AMBULATORY_CARE_PROVIDER_SITE_OTHER): Payer: Medicare HMO | Admitting: Family Medicine

## 2023-07-12 ENCOUNTER — Encounter (INDEPENDENT_AMBULATORY_CARE_PROVIDER_SITE_OTHER): Payer: Self-pay | Admitting: Family Medicine

## 2023-07-12 VITALS — BP 100/65 | HR 80 | Temp 98.5°F | Ht 61.0 in | Wt 253.0 lb

## 2023-07-12 DIAGNOSIS — I152 Hypertension secondary to endocrine disorders: Secondary | ICD-10-CM

## 2023-07-12 DIAGNOSIS — E559 Vitamin D deficiency, unspecified: Secondary | ICD-10-CM | POA: Diagnosis not present

## 2023-07-12 DIAGNOSIS — Z6841 Body Mass Index (BMI) 40.0 and over, adult: Secondary | ICD-10-CM

## 2023-07-12 DIAGNOSIS — Z794 Long term (current) use of insulin: Secondary | ICD-10-CM | POA: Diagnosis not present

## 2023-07-12 DIAGNOSIS — Z7985 Long-term (current) use of injectable non-insulin antidiabetic drugs: Secondary | ICD-10-CM

## 2023-07-12 DIAGNOSIS — Z7984 Long term (current) use of oral hypoglycemic drugs: Secondary | ICD-10-CM | POA: Diagnosis not present

## 2023-07-12 DIAGNOSIS — E1169 Type 2 diabetes mellitus with other specified complication: Secondary | ICD-10-CM | POA: Diagnosis not present

## 2023-07-12 DIAGNOSIS — E1159 Type 2 diabetes mellitus with other circulatory complications: Secondary | ICD-10-CM | POA: Diagnosis not present

## 2023-07-12 MED ORDER — SEMAGLUTIDE (2 MG/DOSE) 8 MG/3ML ~~LOC~~ SOPN: 2.0000 mg | PEN_INJECTOR | SUBCUTANEOUS | 0 refills | Status: DC

## 2023-07-12 MED ORDER — ERGOCALCIFEROL 1.25 MG (50000 UT) PO CAPS: ORAL_CAPSULE | ORAL | 0 refills | Status: DC

## 2023-07-12 NOTE — Progress Notes (Signed)
 [name], D.O.  ABFM, ABOM Specializing in Clinical Bariatric Medicine  Office located at: [address] [place], [place]  [address]   Assessment and Plan:   Orders Placed This Encounter  Procedures   Hemoglobin A1c   VITAMIN D  25 Hydroxy (Vit-D Deficiency, Fractures)   Medications Discontinued During This Encounter  Medication Reason   ergocalciferol  (VITAMIN D2) 1.25 MG (50000 UT) capsule Reorder   Semaglutide , 2 MG/DOSE, 8 MG/3ML SOPN Reorder     Meds ordered this encounter  Medications   ergocalciferol  (VITAMIN D2) 1.25 MG (50000 UT) capsule    Sig: 1 q Thursday and 1 q Sun    Dispense:  24 capsule    Refill:  0    90 d supply;  ** OV for RF **   Do not send RF request   Semaglutide , 2 MG/DOSE, 8 MG/3ML SOPN    Sig: Inject 2 mg as directed once a week.    Dispense:  9 mL    Refill:  0    90 d supply;  ** OV for RF **   Do not send RF request    FOR THE DISEASE OF OBESITY:  BMI 45.0-49.9, adult (HCC)- current BMI 47.83 Morbid obesity (HCC) Assessment & Plan: Since last office visit on 04/13/2023 patient's  Muscle mass has decreased by 1.8 lb. Fat mass has decreased by 3 lb. Total body water has increased by 2.8 lb.  Counseling done on how various foods will affect these numbers and how to maximize success  Total lbs lost to date: 13 lbs  Total weight loss percentage to date: 4.89%    Recommended Dietary Goals [name] is currently in the action stage of change. As such, her goal is to continue weight management plan.  She has agreed to: continue current plan   Behavioral Intervention We discussed the following today: increasing lean protein intake to established goals, decreasing simple carbohydrates , and continue to work on implementation of reduced calorie nutritional plan  Additional resources provided today: Handout on CAT 2 meal plan  Evidence-based interventions for health behavior change were utilized today including the discussion of  self monitoring techniques, problem-solving barriers and SMART goal setting techniques.   Regarding patient's less desirable eating habits and patterns, we employed the technique of small changes.   Pt will specifically work on: n/a   Recommended Physical Activity Goals [name] has been advised to work up to 300-450 minutes of moderate intensity aerobic activity a week and strengthening exercises 2-3 times per week for cardiovascular health, weight loss maintenance and preservation of muscle mass.   She has agreed to : continue to gradually increase the amount and intensity of exercise routine   Pharmacotherapy We both agreed to :  continue same regimen   FOR ASSOCIATED CONDITIONS ADDRESSED TODAY:  Type 2 diabetes mellitus with other specified complication, with long-term current use of insulin  [name]) Assessment & Plan: Most recent A1c and fasting insulin : Lab Results  Component Value Date   HGBA1C 7.4 04/14/2023   HGBA1C 7.4 04/14/2023   INSULIN  23.8 05/03/2022   INSULIN  10.1 04/29/2021    Denies symptoms of hypoglycemia or hyperglycemia. On Lantus, Invokana, Metformin , and Ozmepic with good adherence and no side effects. Hunger and cravings are controlled. Was provided CGM by PCP - recommend she monitor her 2 hour postprandial blood sugars to see how certain foods influences them. Continue regimen above and reduced calorie meal plan low on processed crabs and simple sugars. Recheck  A1c today.    Hypertension associated with type 2 diabetes mellitus (HCC) Assessment & Plan: Last 3 blood pressure readings in our office are as follows: BP Readings from Last 3 Encounters:  07/12/23 100/65  04/13/23 133/61  12/13/22 112/74   The 10-year ASCVD risk score (Arnett DK, et al., 2019) is: 9.5%* (Cholesterol units were assumed)  Lab Results  Component Value Date   CREATININE 0.9 04/14/2023   Reports compliance with Accuretic 20-25 mg once daily and Metoprolol 50 mg daily. BP today is  low normal. Pt asx. Her BP at home averages 110s-120s/60- low 70s. Recommend pt discuss with PCP about potentially going off Metoprolol and initiating a low dose ARB. Encouraged to check BP at home daily or every other day. Continue low NaCl diet.    Vitamin D  deficiency Assessment & Plan: Most recent Vitamin D : Lab Results  Component Value Date   VD25OH 42.9 04/14/2023   VD25OH 51.3 08/09/2022   VD25OH 52.7 05/03/2022   Reports compliance with ergocalciferol  50,000 units twice weekly. Continue supplementation. Recheck today.    Follow up:   Return 10/10/2023. She was informed of the importance of frequent follow up visits to maximize her success with intensive lifestyle modifications for her multiple health conditions.  Tahani A Kauffmann is aware that we will review all of her lab results at our next visit.  She is aware that if anything is critical/ life threatening with the results, we will be contacting her via MyChart prior to the office visit to discuss management.    Subjective:   Chief complaint: Obesity [name] is here to discuss her progress with her obesity treatment plan. She is on the Category 2 Plan with Breakfast and Lunch options and states she is following her eating plan approximately 100% of the time. She states she is walking 40 minutes 4 days per week.  Interval History:  [name] is here for a follow up office visit. Since last OV on 04/13/2023, [name] is down 5 lbs. Reports being under stress because her granddaughter is dealing with health issues. Food recall from today:   Meal Content  1st meal (breakfast) Beverage: 1 Protality Protein Shake  2nd Meal (lunch) Food choice: eggs, 2 slices of bread, cheese, and a small garden salad.     Pharmacotherapy that assist with weight loss: She is currently taking  Metformin  500 mg twice daily and Semaglutide  2 mg weekly .   Review of Systems:  Pertinent positives were addressed with patient today.  Reviewed by  clinician on day of visit: allergies, medications, problem list, medical history, surgical history, family history, social history, and previous encounter notes.  Weight Summary and Biometrics   Weight Lost Since Last Visit: 5lb  Weight Gained Since Last Visit: 0lb   Vitals Temp: 98.5 F (36.9 C) BP: 100/65 Pulse Rate: 80 SpO2: 98 %   Anthropometric Measurements Height: 5\' 1"  (1.549 m) Weight: 253 lb (114.8 kg) BMI (Calculated): 47.83 Weight at Last Visit: 258lb Weight Lost Since Last Visit: 5lb Weight Gained Since Last Visit: 0lb Starting Weight: 266lb Total Weight Loss (lbs): 13 lb (5.897 kg)   Body Composition  Body Fat %: 54.1 % Fat Mass (lbs): 136.8 lbs Muscle Mass (lbs): 110.4 lbs Total Body Water (lbs): 89.6 lbs Visceral Fat Rating : 21   Other Clinical Data Fasting: No Labs: No Today's Visit #: 50 Starting Date: 09/18/19   Objective:   PHYSICAL EXAM: Blood pressure 100/65, pulse 80, temperature 98.5 F (36.9 C),  height 5\' 1"  (1.549 m), weight 253 lb (114.8 kg), SpO2 98%. Body mass index is 47.8 kg/m.  General: she is overweight, cooperative and in no acute distress. PSYCH: Has normal mood, affect and thought process.   HEENT: EOMI, sclerae are anicteric. Lungs: Normal breathing effort, no conversational dyspnea. Extremities: Moves * 4 Neurologic: A and O * 3, good insight  DIAGNOSTIC DATA REVIEWED: BMET    Component Value Date/Time   NA 141 04/14/2023 0000   K 4.0 04/14/2023 0000   CL 100 04/14/2023 0000   CO2 33 (A) 04/14/2023 0000   GLUCOSE 44 (L) 08/09/2022 1457   GLUCOSE 110 (H) 03/02/2015 1534   BUN 24 (A) 04/14/2023 0000   CREATININE 0.9 04/14/2023 0000   CREATININE 0.96 08/09/2022 1457   CALCIUM  10.1 04/14/2023 0000   GFRNONAA 66 10/27/2020 0000   GFRAA 77 10/27/2020 0000   Lab Results  Component Value Date   HGBA1C 7.4 04/14/2023   HGBA1C 7.4 04/14/2023   HGBA1C 9.1 06/27/2006   Lab Results  Component Value Date    INSULIN  23.8 05/03/2022   INSULIN  10.1 04/29/2021   Lab Results  Component Value Date   TSH 1.320 04/29/2021   CBC    Component Value Date/Time   WBC 6.9 04/14/2023 0000   WBC 6.7 03/02/2015 1534   RBC 5.48 (A) 04/14/2023 0000   HGB 14.8 04/14/2023 0000   HGB 15.3 09/18/2019 1455   HCT 46 04/14/2023 0000   HCT 47.5 (H) 09/18/2019 1455   PLT 293 04/14/2023 0000   PLT 312 09/18/2019 1455   MCV 85 09/18/2019 1455   MCH 27.4 09/18/2019 1455   MCH 27.0 03/02/2015 1534   MCHC 32.2 09/18/2019 1455   MCHC 31.3 03/02/2015 1534   RDW 13.5 09/18/2019 1455   Iron Studies No results found for: "IRON", "TIBC", "FERRITIN", "IRONPCTSAT" Lipid Panel     Component Value Date/Time   CHOL 154 04/14/2023 0000   CHOL 145 05/03/2022 1018   TRIG 119 04/14/2023 0000   HDL 53 04/14/2023 0000   HDL 44 05/03/2022 1018   CHOLHDL 3.3 05/03/2022 1018   CHOLHDL 2.6 Ratio 06/27/2008 2132   VLDL 23 06/27/2008 2132   LDLCALC 79 04/14/2023 0000   LDLCALC 78 05/03/2022 1018   LDLDIRECT 66 04/09/2009 2134   Hepatic Function Panel     Component Value Date/Time   PROT 7.9 05/03/2022 1018   ALBUMIN 4.1 04/14/2023 0000   ALBUMIN 4.4 05/03/2022 1018   AST 15 04/14/2023 0000   ALT 21 04/14/2023 0000   ALKPHOS 98 05/03/2022 1018   BILITOT 0.5 05/03/2022 1018      Component Value Date/Time   TSH 1.320 04/29/2021 0849   Nutritional Lab Results  Component Value Date   VD25OH 42.9 04/14/2023   VD25OH 51.3 08/09/2022   VD25OH 52.7 05/03/2022    Attestations:   I, Special Puri , acting as a Stage manager for [employer], DO., have compiled all relevant documentation for today's office visit on behalf of Marceil Sensor, DO, while in the presence of [employer], DO.  I have reviewed the above documentation for accuracy and completeness, and I agree with the above. [name], D.O.  The 21st Century Cures Act was signed into law in 2016 which includes the topic of electronic health  records.  This provides immediate access to information in MyChart.  This includes consultation notes, operative notes, office notes, lab results and pathology reports.  If you have any questions about  what you read please let us  know at your next visit so we can discuss your concerns and take corrective action if need be.  We are right here with you.

## 2023-07-13 LAB — HEMOGLOBIN A1C
Est. average glucose Bld gHb Est-mCnc: 148 mg/dL
Hgb A1c MFr Bld: 6.8 % — ABNORMAL HIGH (ref 4.8–5.6)

## 2023-07-13 LAB — VITAMIN D 25 HYDROXY (VIT D DEFICIENCY, FRACTURES): Vit D, 25-Hydroxy: 55.2 ng/mL (ref 30.0–100.0)

## 2023-07-14 DIAGNOSIS — F4329 Adjustment disorder with other symptoms: Secondary | ICD-10-CM | POA: Diagnosis not present

## 2023-07-21 DIAGNOSIS — F4329 Adjustment disorder with other symptoms: Secondary | ICD-10-CM | POA: Diagnosis not present

## 2023-07-26 ENCOUNTER — Other Ambulatory Visit (INDEPENDENT_AMBULATORY_CARE_PROVIDER_SITE_OTHER): Payer: Self-pay | Admitting: Family Medicine

## 2023-07-26 DIAGNOSIS — E559 Vitamin D deficiency, unspecified: Secondary | ICD-10-CM

## 2023-07-29 DIAGNOSIS — F4329 Adjustment disorder with other symptoms: Secondary | ICD-10-CM | POA: Diagnosis not present

## 2023-08-03 DIAGNOSIS — F4329 Adjustment disorder with other symptoms: Secondary | ICD-10-CM | POA: Diagnosis not present

## 2023-08-09 DIAGNOSIS — E113293 Type 2 diabetes mellitus with mild nonproliferative diabetic retinopathy without macular edema, bilateral: Secondary | ICD-10-CM | POA: Diagnosis not present

## 2023-08-09 DIAGNOSIS — E1142 Type 2 diabetes mellitus with diabetic polyneuropathy: Secondary | ICD-10-CM | POA: Diagnosis not present

## 2023-08-09 DIAGNOSIS — M170 Bilateral primary osteoarthritis of knee: Secondary | ICD-10-CM | POA: Diagnosis not present

## 2023-08-09 DIAGNOSIS — I10 Essential (primary) hypertension: Secondary | ICD-10-CM | POA: Diagnosis not present

## 2023-08-10 DIAGNOSIS — F4329 Adjustment disorder with other symptoms: Secondary | ICD-10-CM | POA: Diagnosis not present

## 2023-08-17 DIAGNOSIS — F4329 Adjustment disorder with other symptoms: Secondary | ICD-10-CM | POA: Diagnosis not present

## 2023-08-24 DIAGNOSIS — F4329 Adjustment disorder with other symptoms: Secondary | ICD-10-CM | POA: Diagnosis not present

## 2023-08-31 DIAGNOSIS — F4329 Adjustment disorder with other symptoms: Secondary | ICD-10-CM | POA: Diagnosis not present

## 2023-09-07 DIAGNOSIS — F4329 Adjustment disorder with other symptoms: Secondary | ICD-10-CM | POA: Diagnosis not present

## 2023-09-15 DIAGNOSIS — F4329 Adjustment disorder with other symptoms: Secondary | ICD-10-CM | POA: Diagnosis not present

## 2023-09-29 DIAGNOSIS — F4329 Adjustment disorder with other symptoms: Secondary | ICD-10-CM | POA: Diagnosis not present

## 2023-10-10 ENCOUNTER — Ambulatory Visit (INDEPENDENT_AMBULATORY_CARE_PROVIDER_SITE_OTHER): Admitting: Family Medicine

## 2023-10-13 DIAGNOSIS — F4329 Adjustment disorder with other symptoms: Secondary | ICD-10-CM | POA: Diagnosis not present

## 2023-10-17 ENCOUNTER — Encounter (INDEPENDENT_AMBULATORY_CARE_PROVIDER_SITE_OTHER): Payer: Self-pay | Admitting: Family Medicine

## 2023-10-17 ENCOUNTER — Ambulatory Visit (INDEPENDENT_AMBULATORY_CARE_PROVIDER_SITE_OTHER): Admitting: Family Medicine

## 2023-10-17 ENCOUNTER — Other Ambulatory Visit (INDEPENDENT_AMBULATORY_CARE_PROVIDER_SITE_OTHER): Payer: Self-pay | Admitting: Family Medicine

## 2023-10-17 VITALS — BP 138/82 | HR 70 | Temp 99.0°F | Ht 61.0 in | Wt 249.0 lb

## 2023-10-17 DIAGNOSIS — E559 Vitamin D deficiency, unspecified: Secondary | ICD-10-CM | POA: Diagnosis not present

## 2023-10-17 DIAGNOSIS — Z7985 Long-term (current) use of injectable non-insulin antidiabetic drugs: Secondary | ICD-10-CM

## 2023-10-17 DIAGNOSIS — E669 Obesity, unspecified: Secondary | ICD-10-CM

## 2023-10-17 DIAGNOSIS — I152 Hypertension secondary to endocrine disorders: Secondary | ICD-10-CM

## 2023-10-17 DIAGNOSIS — Z6841 Body Mass Index (BMI) 40.0 and over, adult: Secondary | ICD-10-CM | POA: Diagnosis not present

## 2023-10-17 DIAGNOSIS — Z7984 Long term (current) use of oral hypoglycemic drugs: Secondary | ICD-10-CM | POA: Diagnosis not present

## 2023-10-17 DIAGNOSIS — E1159 Type 2 diabetes mellitus with other circulatory complications: Secondary | ICD-10-CM | POA: Diagnosis not present

## 2023-10-17 DIAGNOSIS — E1169 Type 2 diabetes mellitus with other specified complication: Secondary | ICD-10-CM | POA: Diagnosis not present

## 2023-10-17 DIAGNOSIS — Z794 Long term (current) use of insulin: Secondary | ICD-10-CM

## 2023-10-17 NOTE — Progress Notes (Signed)
 [name], D.O.  ABFM, ABOM Specializing in Clinical Bariatric Medicine  Office located at: [address] [place], [place]  [address]   Assessment and Plan:   Obtain labs next OV   FOR THE DISEASE OF OBESITY:  BMI 45.0-49.9, adult (HCC)- current BMI 47.05 Starting Morbid obesity (HCC) current 50.29 Assessment & Plan: Since last office visit on 07/12/2023 patient's muscle mass has decreased by 2.8 lbs. Fat mass has decreased by 0.6 lbs. Total body water not registered by scale. Body fat percentage is 54.6%. Counseling done on how various foods will affect these numbers and how to maximize success.   Total lbs lost to date: -17 lbs Total weight loss percentage to date: -6.39 %   Recommended Dietary Goals [name] is currently in the action stage of change. As such, her goal is to continue weight management plan.  She was strongly encouraged to begin journaling 1350-1450 calories and 90++ grams of protein daily with the Cat 2 MP as a guide.    Behavioral Intervention We discussed the following today: bariatric surgery, increasing lean protein intake to established goals and work on tracking and journaling calories using tracking application  Additional resources provided today: Handout on CAT 2 meal plan and Physician provided patient with handouts and personalized instruction on tracking and journaling using Apps (or how to handwrite in notebook) and using logs provided   Evidence-based interventions for health behavior change were utilized today including the discussion of self monitoring techniques, problem-solving barriers and SMART goal setting techniques.   Regarding patient's less desirable eating habits and patterns, we employed the technique of small changes.   Pt will specifically work on: n/a   Recommended Physical Activity Goals [name] has been advised to work up to 300-450 minutes of moderate intensity aerobic activity a week and strengthening  exercises 2-3 times per week for cardiovascular health, weight loss maintenance and preservation of muscle mass.   She has agreed to: continue to gradually increase the amount and intensity of exercise routine   Pharmacotherapy See T2DM note.   ASSOCIATED CONDITIONS ADDRESSED TODAY:   Type 2 diabetes mellitus with obesity [facility]) Assessment & Plan: Lab Results  Component Value Date   HGBA1C 6.8 (H) 07/12/2023   HGBA1C 7.4 04/14/2023   HGBA1C 7.4 04/14/2023   INSULIN  23.8 05/03/2022   INSULIN  10.1 04/29/2021    HgbA1c is not at goal for age and comorbid conditions. Denies symptoms of hypoglycemia or hyperglycemia. On Invokana 300 mg daily, Lantus 30 units am and 15 units pm, Metformin  1000 mg twice daily,  and Ozempic  2 mg weekly with good adherence and no side effects. Good control of hunger and cravings.   - Continue all antidiabetic medications.  - Education provided on Mounjaro; consider transitioning from Ozempic  to Mounjaro in the future.  - Continue with reduced calorie meal plan low on processed crabs and simple sugars.  - Ongoing reduction in adipose tissue will improve condition.    Hypertension associated with type 2 diabetes mellitus (HCC) Assessment & Plan: Last 3 blood pressure readings in our office are as follows: BP Readings from Last 3 Encounters:  10/17/23 138/82  07/12/23 100/65  04/13/23 133/61   The 10-year ASCVD risk score (Arnett DK, et al., 2019) is: 19.6%* (Cholesterol units were assumed)  Lab Results  Component Value Date   CREATININE 0.9 04/14/2023   Reports compliance with Accuretic 20/25 mg once daily and Metoprolol 50 mg daily. BP stable today; pt asx. No acute concerns.   -  Continue adherence to medications.  - Continue to work on Engineer, technical sales and starting journaling intake to promote weight loss and improve BP control.  - Advance exercise as able.     Vitamin D  deficiency Assessment & Plan: Lab Results  Component Value Date    VD25OH 55.2 07/12/2023   VD25OH 42.9 04/14/2023   VD25OH 51.3 08/09/2022   Reports compliance with OTC ergocalciferol  50,000 units twice weekly. No acute concerns.   - Continue Vit D supplementation. - Recheck levels in the future.     Follow up:   Return 11/14/2023 at 10:00 AM.  She was informed of the importance of frequent follow up visits to maximize her success with intensive lifestyle modifications for her multiple health conditions.   Subjective:   Chief complaint: Obesity [name] is here to discuss her progress with her obesity treatment plan. She is on the Category 2 Plan with Breakfast and Lunch options and states she is following her eating plan approximately 80% of the time. She states she is doing water aerobics or walking 20-45 minutes 3 days per week.   Interval History:  [name] is here for a follow up office visit. Since last OV on 07/12/2023 , she is down 4 lbs. She attributes her weight loss to being more physically active. Food recall from a typical day: Breakfast- protein shake. Lunch- salad with cheese, 5 ounces of chicken, and grapes. Dinner- 8 ounces of lean protein. She avoids red meat.    Pharmacotherapy that aid with weight loss: She is currently taking Metformin  500 mg twice daily and Semaglutide  2 mg weekly.    Review of Systems:  Pertinent positives were addressed with patient today.  Reviewed by clinician on day of visit: allergies, medications, problem list, medical history, surgical history, family history, social history, and previous encounter notes.  Weight Summary and Biometrics   Weight Lost Since Last Visit: 4lb  Weight Gained Since Last Visit: 0lb   Vitals Temp: 99 F (37.2 C) BP: 138/82 Pulse Rate: 70 SpO2: 100 %   Anthropometric Measurements Height: 5' 1 (1.549 m) Weight: 249 lb (112.9 kg) BMI (Calculated): 47.07 Weight at Last Visit: 253lb Weight Lost Since Last Visit: 4lb Weight Gained Since Last Visit:  0lb Starting Weight: 266lb Total Weight Loss (lbs): 17 lb (7.711 kg)   Body Composition  Body Fat %: 54.6 % Fat Mass (lbs): 136.2 lbs Muscle Mass (lbs): 107.6 lbs Visceral Fat Rating : 21   Other Clinical Data Fasting: No Labs: no Today's Visit #: 51 Starting Date: 09/18/19 Comments: Cat 2   Objective:   PHYSICAL EXAM: Blood pressure 138/82, pulse 70, temperature 99 F (37.2 C), height 5' 1 (1.549 m), weight 249 lb (112.9 kg), SpO2 100%. Body mass index is 47.05 kg/m.  General: she is overweight, cooperative and in no acute distress. PSYCH: Has normal mood, affect and thought process.   HEENT: EOMI, sclerae are anicteric. Lungs: Normal breathing effort, no conversational dyspnea. Extremities: Moves * 4 Neurologic: A and O * 3, good insight  DIAGNOSTIC DATA REVIEWED: BMET    Component Value Date/Time   NA 141 04/14/2023 0000   K 4.0 04/14/2023 0000   CL 100 04/14/2023 0000   CO2 33 (A) 04/14/2023 0000   GLUCOSE 44 (L) 08/09/2022 1457   GLUCOSE 110 (H) 03/02/2015 1534   BUN 24 (A) 04/14/2023 0000   CREATININE 0.9 04/14/2023 0000   CREATININE 0.96 08/09/2022 1457   CALCIUM  10.1 04/14/2023 0000   GFRNONAA 66 10/27/2020  0000   GFRAA 77 10/27/2020 0000   Lab Results  Component Value Date   HGBA1C 6.8 (H) 07/12/2023   HGBA1C 9.1 06/27/2006   Lab Results  Component Value Date   INSULIN  23.8 05/03/2022   INSULIN  10.1 04/29/2021   Lab Results  Component Value Date   TSH 1.320 04/29/2021   CBC    Component Value Date/Time   WBC 6.9 04/14/2023 0000   WBC 6.7 03/02/2015 1534   RBC 5.48 (A) 04/14/2023 0000   HGB 14.8 04/14/2023 0000   HGB 15.3 09/18/2019 1455   HCT 46 04/14/2023 0000   HCT 47.5 (H) 09/18/2019 1455   PLT 293 04/14/2023 0000   PLT 312 09/18/2019 1455   MCV 85 09/18/2019 1455   MCH 27.4 09/18/2019 1455   MCH 27.0 03/02/2015 1534   MCHC 32.2 09/18/2019 1455   MCHC 31.3 03/02/2015 1534   RDW 13.5 09/18/2019 1455   Iron Studies No  results found for: IRON, TIBC, FERRITIN, IRONPCTSAT Lipid Panel     Component Value Date/Time   CHOL 154 04/14/2023 0000   CHOL 145 05/03/2022 1018   TRIG 119 04/14/2023 0000   HDL 53 04/14/2023 0000   HDL 44 05/03/2022 1018   CHOLHDL 3.3 05/03/2022 1018   CHOLHDL 2.6 Ratio 06/27/2008 2132   VLDL 23 06/27/2008 2132   LDLCALC 79 04/14/2023 0000   LDLCALC 78 05/03/2022 1018   LDLDIRECT 66 04/09/2009 2134   Hepatic Function Panel     Component Value Date/Time   PROT 7.9 05/03/2022 1018   ALBUMIN 4.1 04/14/2023 0000   ALBUMIN 4.4 05/03/2022 1018   AST 15 04/14/2023 0000   ALT 21 04/14/2023 0000   ALKPHOS 98 05/03/2022 1018   BILITOT 0.5 05/03/2022 1018      Component Value Date/Time   TSH 1.320 04/29/2021 0849   Nutritional Lab Results  Component Value Date   VD25OH 55.2 07/12/2023   VD25OH 42.9 04/14/2023   VD25OH 51.3 08/09/2022    Attestations:   I, Special Puri, acting as a Stage manager for [employer], DO., have compiled all relevant documentation for today's office visit on behalf of Theresa Jenkins, DO, while in the presence of [employer], DO.  I have reviewed the above documentation for accuracy and completeness, and I agree with the above. Theresa JINNY [name], D.O.  The 21st Century Cures Act was signed into law in 2016 which includes the topic of electronic health records.  This provides immediate access to information in MyChart.  This includes consultation notes, operative notes, office notes, lab results and pathology reports.  If you have any questions about what you read please let us  know at your next visit so we can discuss your concerns and take corrective action if need be.  We are right here with you.

## 2023-10-20 DIAGNOSIS — F4329 Adjustment disorder with other symptoms: Secondary | ICD-10-CM | POA: Diagnosis not present

## 2023-11-14 ENCOUNTER — Encounter (INDEPENDENT_AMBULATORY_CARE_PROVIDER_SITE_OTHER): Payer: Self-pay | Admitting: Family Medicine

## 2023-11-14 ENCOUNTER — Ambulatory Visit (INDEPENDENT_AMBULATORY_CARE_PROVIDER_SITE_OTHER): Admitting: Family Medicine

## 2023-11-14 VITALS — BP 108/72 | HR 62 | Temp 98.2°F | Ht 61.0 in | Wt 242.0 lb

## 2023-11-14 DIAGNOSIS — E1159 Type 2 diabetes mellitus with other circulatory complications: Secondary | ICD-10-CM | POA: Diagnosis not present

## 2023-11-14 DIAGNOSIS — G4733 Obstructive sleep apnea (adult) (pediatric): Secondary | ICD-10-CM | POA: Diagnosis not present

## 2023-11-14 DIAGNOSIS — E559 Vitamin D deficiency, unspecified: Secondary | ICD-10-CM | POA: Diagnosis not present

## 2023-11-14 DIAGNOSIS — Z794 Long term (current) use of insulin: Secondary | ICD-10-CM

## 2023-11-14 DIAGNOSIS — Z7984 Long term (current) use of oral hypoglycemic drugs: Secondary | ICD-10-CM

## 2023-11-14 DIAGNOSIS — Z6841 Body Mass Index (BMI) 40.0 and over, adult: Secondary | ICD-10-CM

## 2023-11-14 DIAGNOSIS — I152 Hypertension secondary to endocrine disorders: Secondary | ICD-10-CM

## 2023-11-14 DIAGNOSIS — E785 Hyperlipidemia, unspecified: Secondary | ICD-10-CM

## 2023-11-14 DIAGNOSIS — E1169 Type 2 diabetes mellitus with other specified complication: Secondary | ICD-10-CM | POA: Diagnosis not present

## 2023-11-14 DIAGNOSIS — Z7985 Long-term (current) use of injectable non-insulin antidiabetic drugs: Secondary | ICD-10-CM

## 2023-11-14 MED ORDER — METOPROLOL TARTRATE 50 MG PO TABS: ORAL_TABLET | ORAL | Status: DC

## 2023-11-14 MED ORDER — ERGOCALCIFEROL 1.25 MG (50000 UT) PO CAPS: ORAL_CAPSULE | ORAL | 0 refills | Status: DC

## 2023-11-14 MED ORDER — METFORMIN HCL 500 MG PO TABS: ORAL_TABLET | ORAL | 0 refills | Status: DC

## 2023-11-14 MED ORDER — SEMAGLUTIDE (2 MG/DOSE) 8 MG/3ML ~~LOC~~ SOPN
2.0000 mg | PEN_INJECTOR | SUBCUTANEOUS | 0 refills | Status: DC
Start: 2023-11-14 — End: 2024-01-11

## 2023-11-15 LAB — COMPREHENSIVE METABOLIC PANEL WITH GFR
ALT: 16 IU/L (ref 0–32)
AST: 16 IU/L (ref 0–40)
Albumin: 4.1 g/dL (ref 3.9–4.9)
Alkaline Phosphatase: 77 IU/L (ref 44–121)
BUN/Creatinine Ratio: 26 (ref 12–28)
BUN: 25 mg/dL (ref 8–27)
Bilirubin Total: 0.6 mg/dL (ref 0.0–1.2)
CO2: 27 mmol/L (ref 20–29)
Calcium: 9.8 mg/dL (ref 8.7–10.3)
Chloride: 102 mmol/L (ref 96–106)
Creatinine, Ser: 0.97 mg/dL (ref 0.57–1.00)
Globulin, Total: 2.9 g/dL (ref 1.5–4.5)
Glucose: 48 mg/dL — ABNORMAL LOW (ref 70–99)
Potassium: 4.5 mmol/L (ref 3.5–5.2)
Sodium: 142 mmol/L (ref 134–144)
Total Protein: 7 g/dL (ref 6.0–8.5)
eGFR: 65 mL/min/1.73 (ref >59)

## 2023-11-15 LAB — TSH: TSH: 0.609 u[IU]/mL (ref 0.450–4.500)

## 2023-11-15 LAB — CBC WITH DIFFERENTIAL/PLATELET
Basophils Absolute: 0.1 x10E3/uL (ref 0.0–0.2)
Basos: 1 %
EOS (ABSOLUTE): 0.2 x10E3/uL (ref 0.0–0.4)
Eos: 3 %
Hematocrit: 44.1 % (ref 34.0–46.6)
Hemoglobin: 13.4 g/dL (ref 11.1–15.9)
Immature Grans (Abs): 0 x10E3/uL (ref 0.0–0.1)
Immature Granulocytes: 0 %
Lymphocytes Absolute: 3 x10E3/uL (ref 0.7–3.1)
Lymphs: 49 %
MCH: 26.4 pg — ABNORMAL LOW (ref 26.6–33.0)
MCHC: 30.4 g/dL — ABNORMAL LOW (ref 31.5–35.7)
MCV: 87 fL (ref 79–97)
Monocytes Absolute: 0.6 x10E3/uL (ref 0.1–0.9)
Monocytes: 9 %
Neutrophils Absolute: 2.4 x10E3/uL (ref 1.4–7.0)
Neutrophils: 38 %
Platelets: 302 x10E3/uL (ref 150–450)
RBC: 5.07 x10E6/uL (ref 3.77–5.28)
RDW: 13.7 % (ref 11.7–15.4)
WBC: 6.3 x10E3/uL (ref 3.4–10.8)

## 2023-11-15 LAB — VITAMIN D 25 HYDROXY (VIT D DEFICIENCY, FRACTURES): Vit D, 25-Hydroxy: 76.6 ng/mL (ref 30.0–100.0)

## 2023-11-15 LAB — MAGNESIUM: Magnesium: 2.3 mg/dL (ref 1.6–2.3)

## 2023-11-15 LAB — VITAMIN B12: Vitamin B-12: 553 pg/mL (ref 232–1245)

## 2023-11-15 LAB — HEMOGLOBIN A1C
Est. average glucose Bld gHb Est-mCnc: 128 mg/dL
Hgb A1c MFr Bld: 6.1 % — ABNORMAL HIGH (ref 4.8–5.6)

## 2023-11-17 NOTE — Progress Notes (Signed)
 [name], D.O.  ABFM, ABOM Specializing in Clinical Bariatric Medicine  Office located at: [address] [place], [place]  [address]   Assessment and Plan:   Orders Placed This Encounter  Procedures   Hemoglobin A1c   Comprehensive metabolic panel with GFR   VITAMIN D  25 Hydroxy (Vit-D Deficiency, Fractures)   Magnesium   Vitamin B12   TSH   CBC with Differential/Platelet    Medications Discontinued During This Encounter  Medication Reason   metoprolol  (LOPRESSOR ) 50 MG tablet Reorder   metFORMIN  (GLUCOPHAGE ) 500 MG tablet Reorder   ergocalciferol  (VITAMIN D2) 1.25 MG (50000 UT) capsule Reorder   Semaglutide , 2 MG/DOSE, 8 MG/3ML SOPN Reorder     Meds ordered this encounter  Medications   Semaglutide , 2 MG/DOSE, 8 MG/3ML SOPN    Sig: Inject 2 mg as directed once a week.    Dispense:  9 mL    Refill:  0    90 d supply;  ** OV for RF **   Do not send RF request   ergocalciferol  (VITAMIN D2) 1.25 MG (50000 UT) capsule    Sig: 1 q Thursday and 1 q Sun    Dispense:  24 capsule    Refill:  0    90 d supply;  ** OV for RF **   Do not send RF request   metFORMIN  (GLUCOPHAGE ) 500 MG tablet    Sig: TAKE 1 TABLET TWICE DAILY WITH MEALS    Dispense:  180 tablet    Refill:  0   metoprolol  tartrate (LOPRESSOR ) 50 MG tablet    Sig: Pt will cut tab in half q am- 25mg  daily      FOR THE DISEASE OF OBESITY:  BMI 45.0-49.9, adult (HCC)- current BMI 45.73 Starting Morbid obesity (HCC) current 50.29 Assessment & Plan: Since last office visit on 10/17/2023 patient's muscle mass has increased by 13.8 lbs. Fat mass has decreased by 21.4 lbs. Total body water is 91.8 lbs. Body fat % has decreased by 7.3  %. Counseling done on how various foods will affect these numbers and how to maximize success  Total lbs lost to date: -24  lbs Total weight loss percentage to date: -9.02 %   Recommended Dietary Goals [name] is currently in the action stage of change. As such, her  goal is to continue weight management plan.  She has agreed to: continue current plan - journaling 1350-1450 calories and 90++ grams of protein daily    Behavioral Intervention We discussed the following today: work on meal planning and preparation, continue to work on implementation of reduced calorie nutritional plan, and using GPT or another AI platform for recipe ideas- searching low calorie, low carb, high protein chicken recipes etc  Additional resources provided today: Handout on CHAT-GPT inspired recipes.  Evidence-based interventions for health behavior change were utilized today including the discussion of self monitoring techniques, problem-solving barriers and SMART goal setting techniques.   Regarding patient's less desirable eating habits and patterns, we employed the technique of small changes.   Goal: n/a   Recommended Physical Activity Goals [name] has been advised to work up to 300-450 minutes of moderate intensity aerobic activity a week and strengthening exercises 2-3 times per week for cardiovascular health, weight loss maintenance and preservation of muscle mass.   She may Continue to gradually increase the amount and intensity of exercise routine   Pharmacotherapy Cont same regimen.    ASSOCIATED CONDITIONS ADDRESSED TODAY:   Type  2 diabetes mellitus with other specified complication, with long-term current use of insulin  [facility]) Assessment & Plan: Lab Results  Component Value Date   HGBA1C 6.8 (H) 07/12/2023   HGBA1C 7.4 04/14/2023   HGBA1C 7.4 04/14/2023   INSULIN  23.8 05/03/2022   INSULIN  10.1 04/29/2021    HgbA1c is not at goal for age and comorbid conditions. She denies symptoms of hypoglycemia or hyperglycemia. She has a CBG monitor on her arm. Her fasting blood sugar this morning was 90. The highest blood sugar she has seen before going to bed was 155. On Invokana 300 mg daily, Lantus 30 units am and 15 units pm, Metformin  500 mg twice daily,  and  Ozempic  2 mg weekly with good adherence and no side effects. Good control of hunger and cravings.   Continue monitoring blood sugars per PCP/ specialists. Pt asked what are goals are--> Goal 2 hour post prandial blood sugars <180 and goal fasting blood sugar <100. Pt instructed to wean down on the long-acting insulin  dose by 1-2 units every couple of days as tolerated. She will also contact prescribing provider for clarification on weening dose as needed. Continue wt loss and journaling plan with a focus on protein, fruits, and vegetables while limiting simple carbohydrates. Recheck labs today.     Hypertension associated with type 2 diabetes mellitus [facility]) Assessment & Plan: BP Readings from Last 3 Encounters:  11/14/23 108/72  10/17/23 138/82  07/12/23 100/65   On Accuretic 20-25 mg 0.5 tablet at lunch and 0.5 tablet at dinner, Lasix 20 mg daily, and Metoprolol  50 mg daily. She reports feeling dizzy and tired at times- lasts seconds and usually when getting up from seated/ supine position. BP is at goal today. Pt instructed to DECREASE Metoprolol  to 25 mg daily. Monitor BP at home daily or every other day. Continue with low sodium diet, advance exercise as tolerated. Recheck labs today.     Hyperlipidemia associated with type 2 diabetes mellitus [facility]) Assessment & Plan: Lab Results  Component Value Date   CHOL 154 04/14/2023   HDL 53 04/14/2023   LDLCALC 79 04/14/2023   LDLDIRECT 66 04/09/2009   TRIG 119 04/14/2023   CHOLHDL 3.3 05/03/2022   Reviewed most recent lipid panel. LDL at goal <70 . Cont Atorvastatin  40 mg daily per PCP. Cont to maintain a diet low in saturated and trans fats and inc exercise.     OSA on CPAP Assessment & Plan: On CPAP with reported good compliance. She is sleeping 7-9 hours/nite and wakes up refreshed. Continue CPAP therapy and f/up with pcp/ sleep med as instructed. Continue weight loss efforts.     Vitamin D  deficiency Assessment & Plan: Lab  Results  Component Value Date   VD25OH 55.2 07/12/2023   VD25OH 42.9 04/14/2023   Reports compliance with Ergocalciferol  50,000 units twice weekly. NO concerns.  Continue Vit D supplementation; refill today. Recheck levels today.     Follow up:   Return 12/13/2023 at 2:00 PM.  She was informed of the importance of frequent follow up visits to maximize her success with intensive lifestyle modifications for her multiple health conditions.  [name] is aware that we will review all of her lab results at our next visit together in person.  She is aware that if anything is critical/ life threatening with the results, we will be contacting her via MyChart or by my CMA will be calling them prior to the office visit to discuss acute management.  Subjective:   Chief complaint: Obesity [name] is here to discuss her progress with her obesity treatment plan. She is journaling 1350-1450 calories and 90++ grams of protein daily with the Cat 2 MP as a guide and states she is following her eating plan approximately 90% of the time. She states she is walking 20 minutes 5 days a week and swimming 45 minutes 2 days a week.    Interval History:  [name] is here for a follow up office visit. Since last OV on 10/17/2023 , she is down 7 lbs. She is journaling her intake and feels it is useful for mindfulness. She hit her journaling parameters essentially every day. She is measuring and getting in the recommended amounts of lean proteins. She is also eating more fruits and veggies. She is not skipping meals.    Pharmacotherapy that aid with weight loss: She is currently taking Metformin  500 mg twice daily and Semaglutide  2 mg weekly.    Review of Systems:  Pertinent positives were addressed with patient today.  Reviewed by clinician on day of visit: allergies, medications, problem list, medical history, surgical history, family history, social history, and previous encounter notes.  Weight  Summary and Biometrics   No data recorded No data recorded   No data recorded No data recorded No data recorded No data recorded  Objective:   PHYSICAL EXAM: Blood pressure 108/72, pulse 62, temperature 98.2 F (36.8 C), height 5' 1 (1.549 m), weight 242 lb (109.8 kg), SpO2 96%. Body mass index is 45.73 kg/m.  General: she is overweight, cooperative and in no acute distress. PSYCH: Has normal mood, affect and thought process.   HEENT: EOMI, sclerae are anicteric. Lungs: Normal breathing effort, no conversational dyspnea. Extremities: Moves * 4 Neurologic: A and O * 3, good insight  DIAGNOSTIC DATA REVIEWED: BMET    Component Value Date/Time   NA 142 11/14/2023 1053   K 4.5 11/14/2023 1053   CL 102 11/14/2023 1053   CO2 27 11/14/2023 1053   GLUCOSE 48 (L) 11/14/2023 1053   GLUCOSE 110 (H) 03/02/2015 1534   BUN 25 11/14/2023 1053   CREATININE 0.97 11/14/2023 1053   CALCIUM  9.8 11/14/2023 1053   GFRNONAA 66 10/27/2020 0000   GFRAA 77 10/27/2020 0000   Lab Results  Component Value Date   HGBA1C 6.1 (H) 11/14/2023   HGBA1C 9.1 06/27/2006   Lab Results  Component Value Date   INSULIN  23.8 05/03/2022   INSULIN  10.1 04/29/2021   Lab Results  Component Value Date   TSH 0.609 11/14/2023   CBC    Component Value Date/Time   WBC 6.3 11/14/2023 1053   WBC 6.7 03/02/2015 1534   RBC 5.07 11/14/2023 1053   RBC 5.48 (A) 04/14/2023 0000   HGB 13.4 11/14/2023 1053   HCT 44.1 11/14/2023 1053   PLT 302 11/14/2023 1053   MCV 87 11/14/2023 1053   MCH 26.4 (L) 11/14/2023 1053   MCH 27.0 03/02/2015 1534   MCHC 30.4 (L) 11/14/2023 1053   MCHC 31.3 03/02/2015 1534   RDW 13.7 11/14/2023 1053   Iron Studies No results found for: IRON, TIBC, FERRITIN, IRONPCTSAT Lipid Panel     Component Value Date/Time   CHOL 154 04/14/2023 0000   CHOL 145 05/03/2022 1018   TRIG 119 04/14/2023 0000   HDL 53 04/14/2023 0000   HDL 44 05/03/2022 1018   CHOLHDL 3.3 05/03/2022  1018   CHOLHDL 2.6 Ratio 06/27/2008 2132   VLDL 23 06/27/2008 2132  LDLCALC 79 04/14/2023 0000   LDLCALC 78 05/03/2022 1018   LDLDIRECT 66 04/09/2009 2134   Hepatic Function Panel     Component Value Date/Time   PROT 7.0 11/14/2023 1053   ALBUMIN 4.1 11/14/2023 1053   AST 16 11/14/2023 1053   ALT 16 11/14/2023 1053   ALKPHOS 77 11/14/2023 1053   BILITOT 0.6 11/14/2023 1053      Component Value Date/Time   TSH 0.609 11/14/2023 1053   Nutritional Lab Results  Component Value Date   VD25OH 76.6 11/14/2023   VD25OH 55.2 07/12/2023   VD25OH 42.9 04/14/2023    Attestations:   I, Special Puri, acting as a Stage manager for [employer], DO., have compiled all relevant documentation for today's office visit on behalf of Theresa Jenkins, DO, while in the presence of [employer], DO.  I have reviewed the above documentation for accuracy and completeness, and I agree with the above. Theresa JINNY [name], D.O.  The 21st Century Cures Act was signed into law in 2016 which includes the topic of electronic health records.  This provides immediate access to information in MyChart.  This includes consultation notes, operative notes, office notes, lab results and pathology reports.  If you have any questions about what you read please let us  know at your next visit so we can discuss your concerns and take corrective action if need be.  We are right here with you.

## 2023-12-13 ENCOUNTER — Encounter (INDEPENDENT_AMBULATORY_CARE_PROVIDER_SITE_OTHER): Payer: Self-pay | Admitting: Family Medicine

## 2023-12-13 ENCOUNTER — Ambulatory Visit (INDEPENDENT_AMBULATORY_CARE_PROVIDER_SITE_OTHER): Admitting: Family Medicine

## 2023-12-13 DIAGNOSIS — Z6841 Body Mass Index (BMI) 40.0 and over, adult: Secondary | ICD-10-CM | POA: Diagnosis not present

## 2023-12-13 DIAGNOSIS — E1169 Type 2 diabetes mellitus with other specified complication: Secondary | ICD-10-CM

## 2023-12-13 DIAGNOSIS — I152 Hypertension secondary to endocrine disorders: Secondary | ICD-10-CM

## 2023-12-13 DIAGNOSIS — Z7984 Long term (current) use of oral hypoglycemic drugs: Secondary | ICD-10-CM

## 2023-12-13 DIAGNOSIS — E1159 Type 2 diabetes mellitus with other circulatory complications: Secondary | ICD-10-CM

## 2023-12-13 DIAGNOSIS — E559 Vitamin D deficiency, unspecified: Secondary | ICD-10-CM | POA: Diagnosis not present

## 2023-12-13 DIAGNOSIS — Z7985 Long-term (current) use of injectable non-insulin antidiabetic drugs: Secondary | ICD-10-CM

## 2023-12-13 DIAGNOSIS — E785 Hyperlipidemia, unspecified: Secondary | ICD-10-CM | POA: Diagnosis not present

## 2023-12-13 DIAGNOSIS — Z794 Long term (current) use of insulin: Secondary | ICD-10-CM | POA: Diagnosis not present

## 2023-12-13 MED ORDER — ERGOCALCIFEROL 1.25 MG (50000 UT) PO CAPS: ORAL_CAPSULE | ORAL | 0 refills | Status: DC

## 2023-12-13 MED ORDER — METFORMIN HCL 500 MG PO TABS: ORAL_TABLET | ORAL | Status: DC

## 2023-12-13 NOTE — Progress Notes (Signed)
 [name], D.O.  ABFM, ABOM Specializing in Clinical Bariatric Medicine  Office located at: [address] [place], [place]  [address]   Assessment and Plan:   Medications Discontinued During This Encounter  Medication Reason   metFORMIN  (GLUCOPHAGE ) 500 MG tablet    ergocalciferol  (VITAMIN D2) 1.25 MG (50000 UT) capsule      Meds ordered this encounter  Medications   metFORMIN  (GLUCOPHAGE ) 500 MG tablet    Sig: TAKE 1/2 TABLET TWICE DAILY WITH MEALS   ergocalciferol  (VITAMIN D2) 1.25 MG (50000 UT) capsule    Sig: 1 po q 5 days    Dispense:  18 capsule    Refill:  0    90 d supply;  ** OV for RF **   Do not send RF request      FOR THE DISEASE OF OBESITY:  Morbid obesity (HCC) Starting BMI 50.29 BMI 45.0-49.9, adult (HCC)- current BMI 44.8 Assessment & Plan: Since last office visit on 11/14/23 patient's muscle mass has decreased by 9 lbs. Fat mass has increased by 4.2 lbs. Total body water has decreased by 1.4 lbs.  Body fat % has increased by 2.8 %. Counseling done on how various foods will affect these numbers and how to maximize success  Total lbs lost to date: - 29 lbs Total weight loss percentage to date: - 10.90 %   Recommended Dietary Goals Khia is currently in the action stage of change. As such, her goal is to continue weight management plan.  She has agreed to: journaling 1350-1450 calories and 90++ grams of protein daily    Behavioral Intervention We discussed the following today: keeping healthy foods at home,shopping at [facility]  Additional resources provided today: Handout on Daily Food Journaling Log  Evidence-based interventions for health behavior change were utilized today including the discussion of self monitoring techniques, problem-solving barriers and SMART goal setting techniques.   Regarding patient's less desirable eating habits and patterns, we employed the technique of small changes.   Goal(s) for next OV: Journal  totals everyday    Recommended Physical Activity Goals Kelia has been advised to work up to 300-450 minutes of moderate intensity aerobic activity a week and strengthening exercises 2-3 times per week for cardiovascular health, weight loss maintenance and preservation of muscle mass.   She has agreed to: Think about enjoyable ways to increase daily physical activity and overcoming barriers to exercise, Increase physical activity in their day and reduce sedentary time (increase NEAT)., Continue to gradually increase the amount and intensity of exercise routine, and Combine aerobic and strengthening exercises for efficiency and improved cardiometabolic health.   Pharmacotherapy We both agreed to: Continue with current nutritional and behavioral strategies and weight loss medication (Metformin )   ASSOCIATED CONDITIONS ADDRESSED TODAY:  Type 2 diabetes mellitus with other specified complication, with long-term current use of insulin  [facility]) Assessment & Plan Lab Results  Component Value Date   HGBA1C 6.1 (H) 11/14/2023   HGBA1C 6.8 (H) 07/12/2023   HGBA1C 7.4 04/14/2023   HGBA1C 7.4 04/14/2023   INSULIN  23.8 05/03/2022   INSULIN  10.1 04/29/2021    Lab Results  Component Value Date   CREATININE 0.97 11/14/2023   BUN 25 11/14/2023   NA 142 11/14/2023   K 4.5 11/14/2023   CL 102 11/14/2023   CO2 27 11/14/2023      Component Value Date/Time   PROT 7.0 11/14/2023 1053   ALBUMIN 4.1 11/14/2023 1053   AST 16 11/14/2023 1053  ALT 16 11/14/2023 1053   ALKPHOS 77 11/14/2023 1053   BILITOT 0.6 11/14/2023 1053    Lab Results  Component Value Date   TSH 0.609 11/14/2023   CBC    Component Value Date/Time   WBC 6.3 11/14/2023 1053   WBC 6.7 03/02/2015 1534   RBC 5.07 11/14/2023 1053   RBC 5.48 (A) 04/14/2023 0000   HGB 13.4 11/14/2023 1053   HCT 44.1 11/14/2023 1053   PLT 302 11/14/2023 1053   MCV 87 11/14/2023 1053   MCH 26.4 (L) 11/14/2023 1053   MCH 27.0 03/02/2015 1534    MCHC 30.4 (L) 11/14/2023 1053   MCHC 31.3 03/02/2015 1534   RDW 13.7 11/14/2023 1053   LYMPHSABS 3.0 11/14/2023 1053   MONOABS 0.6 05/14/2008 1256   EOSABS 0.2 11/14/2023 1053   BASOSABS 0.1 11/14/2023 1053     Reviewed labs today. A1c has decreased from 7.4 to 6.1. Kidney panel is WNL. Salt levels seem to have gone up a little bit. Pt should focus on increasing water intake. Magnesium, thyroid and CBC are all WNL. Pt is on Invokana 300 mg daily, Lantus 30 units am and 15 units pm, Metformin  500 mg twice daily, and Ozempic  2 mg weekly. With good compliance and tolerance. Pt states that she is having over suppressed hunger. Mutual agreement to Decrease Metformin  half tablet in morning and half tablet in afternoon. Pt states that she is not having any low glucose levels. Encouraged pt to check with endocrinologist about Metformin  and see if it is beneficial to disease process. Pt denied any low sugar symptoms, asked pt to write sugar levels for the next 7 day and bring to next OV. Continue with medications and increase protein intake to help stabilize sugar levels.     Hypertension associated with type 2 diabetes mellitus [facility]) Assessment & Plan: BP Readings from Last 3 Encounters:  12/13/23 118/68  11/14/23 108/72  10/17/23 138/82   The 10-year ASCVD risk score (Arnett DK, et al., 2019) is: 13.8%* (Cholesterol units were assumed)  Lab Results  Component Value Date   CREATININE 0.97 11/14/2023   On Accuretic 20-25 mg 0.5 tablet at lunch and 0.5 tablet at dinner, Lasix 20 mg daily and Metoprolol  25 mg daily. With good compliance and tolerance. BP is at goal today controlled. Continue with low sodium diet and medications. Avoid buying foods that are: processed, frozen, or prepackaged to avoid excess salt.    Vitamin D  deficiency Assessment & Plan Lab Results  Component Value Date   VD25OH 76.6 11/14/2023   VD25OH 55.2 07/12/2023   VD25OH 42.9 04/14/2023   Reviewed labs today. Vit  D is at goal. Pt was taking ERGO 50k twice weekly.With good compliance and tolerance.  Will decrease supplementation to once every 5 days. Will refill today.    Follow up:   Return on 01/11/24 at 2:00 PM She was informed of the importance of frequent follow up visits to maximize her success with intensive lifestyle modifications for her multiple health conditions.    Subjective:    Chief complaint: Obesity Tesia is here to discuss her progress with her obesity treatment plan. She is on journaling 1350-1450 calories and 90++ grams of protein daily  and states she is following her eating plan approximately 95 % of the time. Pt is walking or swimming  15-45 minutes 5-7  days per week    Interval History:  Hayly A Barradas is here for a follow up office visit. Pt has experienced  a weight loss of 5 lbs since last OV on 11/14/23. Pt states that she is doing good and well [facility]. Pt endorses waking up in the morning and walking 15 min and doing the same in the afternoon and feels like it has helped her sleep better. [facility] has [facility] her with her socialization and now goes out with friends 3 x a week and tracks everything on paper. She usually shops at [employer] or [employer]. She eats 2 eggs and a piece of toast before going swimming.    Their dietary and life style habits include:  - Tracking Calories/Macros: yes  - Eating More Whole Foods: yes  - Adequate Protein Intake: yes  - Adequate Water Intake: yes  - Skipping Meals: no   - Sleeping 7-9 Hours/ Night: yes  Pharmacotherapy that aid with weight loss: She is currently taking Metformin  500 mg twice daily and Ozempic  2 mg weekly.    Review of Systems:  Pertinent positives were addressed with patient today.  Reviewed by clinician on day of visit: allergies, medications, problem list, medical history, surgical history, family history, social history, and previous encounter notes.  Weight Summary and  Biometrics   Weight Lost Since Last Visit: 5lb  Weight Gained Since Last Visit: 0lb   Vitals Temp: 98.8 F (37.1 C) BP: 118/68 Pulse Rate: 61 SpO2: 97 %   Anthropometric Measurements Height: 5' 1 (1.549 m) Weight: 237 lb (107.5 kg) BMI (Calculated): 44.8 Weight at Last Visit: 242lb Weight Lost Since Last Visit: 5lb Weight Gained Since Last Visit: 0lb Starting Weight: 266lb Total Weight Loss (lbs): 29 lb (13.2 kg) Peak Weight: 299lb   Body Composition  Body Fat %: 50.1 % Fat Mass (lbs): 119 lbs Muscle Mass (lbs): 112.4 lbs Total Body Water (lbs): 90.4 lbs Visceral Fat Rating : 18   Other Clinical Data Fasting: no Labs: no Today's Visit #: 53 Starting Date: 09/18/19    Objective:   PHYSICAL EXAM: Blood pressure 118/68, pulse 61, temperature 98.8 F (37.1 C), height 5' 1 (1.549 m), weight 237 lb (107.5 kg), SpO2 97%. Body mass index is 44.78 kg/m.  General: she is overweight, cooperative and in no acute distress. PSYCH: Has normal mood, affect and thought process.   HEENT: EOMI, sclerae are anicteric. Lungs: Normal breathing effort, no conversational dyspnea. Extremities: Moves * 4 Neurologic: A and O * 3, good insight  DIAGNOSTIC DATA REVIEWED: BMET    Component Value Date/Time   NA 142 11/14/2023 1053   K 4.5 11/14/2023 1053   CL 102 11/14/2023 1053   CO2 27 11/14/2023 1053   GLUCOSE 48 (L) 11/14/2023 1053   GLUCOSE 110 (H) 03/02/2015 1534   BUN 25 11/14/2023 1053   CREATININE 0.97 11/14/2023 1053   CALCIUM  9.8 11/14/2023 1053   GFRNONAA 66 10/27/2020 0000   GFRAA 77 10/27/2020 0000   Lab Results  Component Value Date   HGBA1C 6.1 (H) 11/14/2023   HGBA1C 9.1 06/27/2006   Lab Results  Component Value Date   INSULIN  23.8 05/03/2022   INSULIN  10.1 04/29/2021   Lab Results  Component Value Date   TSH 0.609 11/14/2023   CBC    Component Value Date/Time   WBC 6.3 11/14/2023 1053   WBC 6.7 03/02/2015 1534   RBC 5.07 11/14/2023  1053   RBC 5.48 (A) 04/14/2023 0000   HGB 13.4 11/14/2023 1053   HCT 44.1 11/14/2023 1053   PLT 302 11/14/2023 1053  MCV 87 11/14/2023 1053   MCH 26.4 (L) 11/14/2023 1053   MCH 27.0 03/02/2015 1534   MCHC 30.4 (L) 11/14/2023 1053   MCHC 31.3 03/02/2015 1534   RDW 13.7 11/14/2023 1053   Iron Studies No results found for: IRON, TIBC, FERRITIN, IRONPCTSAT Lipid Panel     Component Value Date/Time   CHOL 154 04/14/2023 0000   CHOL 145 05/03/2022 1018   TRIG 119 04/14/2023 0000   HDL 53 04/14/2023 0000   HDL 44 05/03/2022 1018   CHOLHDL 3.3 05/03/2022 1018   CHOLHDL 2.6 Ratio 06/27/2008 2132   VLDL 23 06/27/2008 2132   LDLCALC 79 04/14/2023 0000   LDLCALC 78 05/03/2022 1018   LDLDIRECT 66 04/09/2009 2134   Hepatic Function Panel     Component Value Date/Time   PROT 7.0 11/14/2023 1053   ALBUMIN 4.1 11/14/2023 1053   AST 16 11/14/2023 1053   ALT 16 11/14/2023 1053   ALKPHOS 77 11/14/2023 1053   BILITOT 0.6 11/14/2023 1053      Component Value Date/Time   TSH 0.609 11/14/2023 1053   Nutritional Lab Results  Component Value Date   VD25OH 76.6 11/14/2023   VD25OH 55.2 07/12/2023   VD25OH 42.9 04/14/2023    Attestations:   I, Sonny Laroche, acting as a Stage manager for Theresa Jenkins, DO., have compiled all relevant documentation for today's office visit on behalf of Theresa Jenkins, DO, while in the presence of [employer], DO.     I have reviewed the above documentation for accuracy and completeness, and I agree with the above. Theresa JINNY [name], D.O.  The 21st Century Cures Act was signed into law in 2016 which includes the topic of electronic health records.  This provides immediate access to information in MyChart.  This includes consultation notes, operative notes, office notes, lab results and pathology reports.  If you have any questions about what you read please let us  know at your next visit so we can discuss your concerns and take  corrective action if need be.  We are right here with you.

## 2023-12-21 DIAGNOSIS — Z23 Encounter for immunization: Secondary | ICD-10-CM | POA: Diagnosis not present

## 2023-12-21 DIAGNOSIS — Z0000 Encounter for general adult medical examination without abnormal findings: Secondary | ICD-10-CM | POA: Diagnosis not present

## 2023-12-21 DIAGNOSIS — E7800 Pure hypercholesterolemia, unspecified: Secondary | ICD-10-CM | POA: Diagnosis not present

## 2023-12-21 DIAGNOSIS — I10 Essential (primary) hypertension: Secondary | ICD-10-CM | POA: Diagnosis not present

## 2023-12-21 DIAGNOSIS — E113293 Type 2 diabetes mellitus with mild nonproliferative diabetic retinopathy without macular edema, bilateral: Secondary | ICD-10-CM | POA: Diagnosis not present

## 2023-12-21 DIAGNOSIS — E559 Vitamin D deficiency, unspecified: Secondary | ICD-10-CM | POA: Diagnosis not present

## 2023-12-21 DIAGNOSIS — M170 Bilateral primary osteoarthritis of knee: Secondary | ICD-10-CM | POA: Diagnosis not present

## 2023-12-21 DIAGNOSIS — E1142 Type 2 diabetes mellitus with diabetic polyneuropathy: Secondary | ICD-10-CM | POA: Diagnosis not present

## 2023-12-21 DIAGNOSIS — Z1389 Encounter for screening for other disorder: Secondary | ICD-10-CM | POA: Diagnosis not present

## 2023-12-21 DIAGNOSIS — F3341 Major depressive disorder, recurrent, in partial remission: Secondary | ICD-10-CM | POA: Diagnosis not present

## 2023-12-29 DIAGNOSIS — G4733 Obstructive sleep apnea (adult) (pediatric): Secondary | ICD-10-CM | POA: Diagnosis not present

## 2024-01-11 ENCOUNTER — Ambulatory Visit (INDEPENDENT_AMBULATORY_CARE_PROVIDER_SITE_OTHER): Admitting: Family Medicine

## 2024-01-11 ENCOUNTER — Encounter (INDEPENDENT_AMBULATORY_CARE_PROVIDER_SITE_OTHER): Payer: Self-pay | Admitting: Family Medicine

## 2024-01-11 VITALS — BP 137/80 | HR 59 | Temp 98.1°F | Ht 61.0 in | Wt 237.0 lb

## 2024-01-11 DIAGNOSIS — G4733 Obstructive sleep apnea (adult) (pediatric): Secondary | ICD-10-CM | POA: Diagnosis not present

## 2024-01-11 DIAGNOSIS — Z6841 Body Mass Index (BMI) 40.0 and over, adult: Secondary | ICD-10-CM

## 2024-01-11 DIAGNOSIS — Z7984 Long term (current) use of oral hypoglycemic drugs: Secondary | ICD-10-CM

## 2024-01-11 DIAGNOSIS — E1159 Type 2 diabetes mellitus with other circulatory complications: Secondary | ICD-10-CM | POA: Diagnosis not present

## 2024-01-11 DIAGNOSIS — E559 Vitamin D deficiency, unspecified: Secondary | ICD-10-CM

## 2024-01-11 DIAGNOSIS — Z7985 Long-term (current) use of injectable non-insulin antidiabetic drugs: Secondary | ICD-10-CM | POA: Diagnosis not present

## 2024-01-11 DIAGNOSIS — I152 Hypertension secondary to endocrine disorders: Secondary | ICD-10-CM | POA: Diagnosis not present

## 2024-01-11 DIAGNOSIS — E669 Obesity, unspecified: Secondary | ICD-10-CM

## 2024-01-11 MED ORDER — SEMAGLUTIDE (2 MG/DOSE) 8 MG/3ML ~~LOC~~ SOPN: 2.0000 mg | PEN_INJECTOR | SUBCUTANEOUS | 0 refills | Status: DC

## 2024-01-11 NOTE — Progress Notes (Signed)
 [name], D.O.  ABFM, ABOM Specializing in Clinical Bariatric Medicine  Office located at: [address] [place], [place]  [address]    FOR THE CHRONIC DISEASE OF OBESITY:   Morbid obesity (HCC) Starting BMI 50.29; BMI 45.0-49.9, adult (HCC)- current BMI 44.8  Weight Summary and Body Composition Analysis  Weight Lost Since Last Visit: 0  Weight Gained Since Last Visit: 0    Vitals Temp: 98.1 F (36.7 C) BP: 137/80 Pulse Rate: (!) 59 SpO2: 98 %   Anthropometric Measurements Height: 5' 1 (1.549 m) Weight: 237 lb (107.5 kg) BMI (Calculated): 44.8 Weight at Last Visit: 237lb Weight Lost Since Last Visit: 0 Weight Gained Since Last Visit: 0 Starting Weight: 266lb Total Weight Loss (lbs): 29 lb (13.2 kg) Peak Weight: 299lb   Body Composition  Body Fat %: 52.5 % Fat Mass (lbs): 124.8 lbs Muscle Mass (lbs): 107.2 lbs Total Body Water (lbs): 85.4 lbs Visceral Fat Rating : 19   Other Clinical Data Fasting: no Labs: no Today's Visit #: 54 Starting Date: 09/18/19    Chief complaint: Obesity  Interval History [name] is here for a follow-up office visit to discuss her progress with her obesity treatment plan. She is keeping a food journal and adhering to recommended goals of 1350-1450 calories and 90++ grams protein and states she is following her eating plan approximately 95 % of the time. She is walking and swimming 30-45  minutes 2 days per week  She has experienced no weight change since last OV on 12/13/2023.   Her dietary and life habits include:  - States she was sick the past couple of days and was eating a lot of soup.  - Tracking Calories/Macros: yes - she is journaling consistently. Overall, she is slightly under in her calorie intake on some days and adhered to her protein goals every day.  - Eating More Whole Foods: yes  - Adequate Water Intake: yes  - Skipping Meals: no  - Sleeping 7-9 Hours/ Night: yes    12/13/23  13:00 01/11/24 13:00   Body Fat % 50.1 % 52.5 %  Muscle Mass (lbs) 112.4 lbs 107.2 lbs  Fat Mass (lbs) 119 lbs 124.8 lbs  Total Body Water (lbs) 90.4 lbs 85.4 lbs  Visceral Fat Rating  18 19   Counseling done on how various foods will affect these numbers and how to maximize success  Total lbs lost to date: - 29 lbs Total Fat Mass lost to date:   - 25.8 lbs Total weight loss percentage to date: -10.90 %   Nutritional and Behavioral Counseling:  We discussed the following today: continue to work on maintaining a reduced calorie state, getting the recommended amount of protein, incorporating whole foods, making healthy choices, staying well hydrated and practicing mindfulness when eating.  Additional resources provided today: Handout on Daily Food Journaling Log  Evidence-based interventions for health behavior change were utilized today including the discussion of self monitoring techniques, problem-solving barriers and SMART goal setting techniques.   Regarding patient's less desirable eating habits and patterns, we employed the technique of small changes.   SMART Goal(s) created today: n/a   Recommended Dietary Goals [name] is currently in the action stage of change. As such, her goal is to continue weight management plan.  She has agreed to continue journaling 1350-1450 calories and 90++ grams protein    Recommended Physical Activity Goals [name] has been advised to work up to 300-450 minutes of moderate intensity  aerobic activity a week and strengthening exercises 2-3 times per week for cardiovascular health, weight loss maintenance and preservation of muscle mass.   She has agreed to get back to swimming 45 minutes 2-3 days a week and walking 35 minutes the other days.   Medical Interventions and Pharmacotherapy Previous Bariatric surgery: n/a Pharmacotherapy: cont same regimen    OBESITY RELATED CONDITIONS ADDRESSED TODAY:    Medications Discontinued During This  Encounter  Medication Reason   metoprolol  tartrate (LOPRESSOR ) 50 MG tablet Discontinued by provider     Meds ordered this encounter  Medications   Semaglutide , 2 MG/DOSE, 8 MG/3ML SOPN    Sig: Inject 2 mg as directed once a week.    Dispense:  9 mL    Refill:  0     Type 2 diabetes mellitus in patient with obesity [facility]) Assessment & Plan: Lab Results  Component Value Date   HGBA1C 6.1 (H) 11/14/2023   HGBA1C 6.8 (H) 07/12/2023   HGBA1C 7.4 04/14/2023   HGBA1C 7.4 04/14/2023   INSULIN  23.8 05/03/2022   INSULIN  10.1 04/29/2021    Relevant medications: Semaglutide  2 mg wkly, Metformin  500 mg 0.5 tablet twice daily, and Lantus. Pt is weaning off the Lantus under the direction of her PCP. PCP recently changed Lantus by 2 units; pt currently taking 28 units in the AM and 13 units in the PM. States her fasting sugars average 70-135. Denies lows. Good control of hunger and cravings.Continue regimen. Continue working on nutrition plan to decrease simple carbohydrates, increase lean proteins and exercise to promote weight loss.     Hypertension associated with type 2 diabetes mellitus (HCC) Assessment & Plan: Last 3 blood pressure readings in our office are as follows: BP Readings from Last 3 Encounters:  01/11/24 137/80  12/13/23 118/68  11/14/23 108/72   The 10-year ASCVD risk score (Arnett DK, et al., 2019) is: 19.3%* (Cholesterol units were assumed)  Lab Results  Component Value Date   CREATININE 0.97 11/14/2023   Pt met with her PCP 2 weeks ago and states that her Metoprolol  25 mg was discontinued. She is currently on Lasix 20 mg daily and Quinapril-Hydrochlorothiazide 20-25 mg daily. BP stable today. Pt asx. States her BP has been well-controlled at home. Cont regimen and working on nutrition plan to promote weight loss and improve BP control.     OSA on CPAP Assessment & Plan: On CPAP with reported good compliance. She was recently seen by an [facility] provider  on 12/29/2023. Unfortunately, I cannot see this note. Pt states that no acute changes were made and that her machine is comfortable and UTD. Continue CPAP therapy and weight loss efforts.    Vitamin D  deficiency Assessment & Plan: Lab Results  Component Value Date   VD25OH 76.6 11/14/2023   VD25OH 55.2 07/12/2023   VD25OH 42.9 04/14/2023   She mentions that her PCP told her to start OTC Vit D 1,000 units daily and discontinue the Ergocalciferol  50,000 units q 5 days. Pt has been continuing to take the strength Vit D as she wanted to consult with me first. I discussed the importance of vitamin D  to the patient's health and well-being as well as to their ability to lose weight. Vit D supplementation may lead to improved satiety and a decrease in inflammatory markers. Recommend she continue the strength Vit D and she may also continue the OTC Vit D. Recheck levels in 1-2 months or so.     Objective:  PHYSICAL EXAM: Blood pressure 137/80, pulse (!) 59, temperature 98.1 F (36.7 C), height 5' 1 (1.549 m), weight 237 lb (107.5 kg), SpO2 98%. Body mass index is 44.78 kg/m.  General: she is overweight, cooperative and in no acute distress. PSYCH: Has normal mood, affect and thought process.   HEENT: EOMI, sclerae are anicteric. Lungs: Normal breathing effort, no conversational dyspnea. Extremities: Moves * 4 Neurologic: A and O * 3, good insight  DIAGNOSTIC DATA REVIEWED: BMET    Component Value Date/Time   NA 142 11/14/2023 1053   K 4.5 11/14/2023 1053   CL 102 11/14/2023 1053   CO2 27 11/14/2023 1053   GLUCOSE 48 (L) 11/14/2023 1053   GLUCOSE 110 (H) 03/02/2015 1534   BUN 25 11/14/2023 1053   CREATININE 0.97 11/14/2023 1053   CALCIUM  9.8 11/14/2023 1053   GFRNONAA 66 10/27/2020 0000   GFRAA 77 10/27/2020 0000   Lab Results  Component Value Date   HGBA1C 6.1 (H) 11/14/2023   HGBA1C 9.1 06/27/2006   Lab Results  Component Value Date   INSULIN  23.8 05/03/2022   INSULIN   10.1 04/29/2021   Lab Results  Component Value Date   TSH 0.609 11/14/2023   CBC    Component Value Date/Time   WBC 6.3 11/14/2023 1053   WBC 6.7 03/02/2015 1534   RBC 5.07 11/14/2023 1053   RBC 5.48 (A) 04/14/2023 0000   HGB 13.4 11/14/2023 1053   HCT 44.1 11/14/2023 1053   PLT 302 11/14/2023 1053   MCV 87 11/14/2023 1053   MCH 26.4 (L) 11/14/2023 1053   MCH 27.0 03/02/2015 1534   MCHC 30.4 (L) 11/14/2023 1053   MCHC 31.3 03/02/2015 1534   RDW 13.7 11/14/2023 1053   Iron Studies No results found for: IRON, TIBC, FERRITIN, IRONPCTSAT Lipid Panel     Component Value Date/Time   CHOL 154 04/14/2023 0000   CHOL 145 05/03/2022 1018   TRIG 119 04/14/2023 0000   HDL 53 04/14/2023 0000   HDL 44 05/03/2022 1018   CHOLHDL 3.3 05/03/2022 1018   CHOLHDL 2.6 Ratio 06/27/2008 2132   VLDL 23 06/27/2008 2132   LDLCALC 79 04/14/2023 0000   LDLCALC 78 05/03/2022 1018   LDLDIRECT 66 04/09/2009 2134   Hepatic Function Panel     Component Value Date/Time   PROT 7.0 11/14/2023 1053   ALBUMIN 4.1 11/14/2023 1053   AST 16 11/14/2023 1053   ALT 16 11/14/2023 1053   ALKPHOS 77 11/14/2023 1053   BILITOT 0.6 11/14/2023 1053      Component Value Date/Time   TSH 0.609 11/14/2023 1053   Nutritional Lab Results  Component Value Date   VD25OH 76.6 11/14/2023   VD25OH 55.2 07/12/2023   VD25OH 42.9 04/14/2023     Follow up:   Return 02/08/2024 at 2:00 PM. She was informed of the importance of frequent follow up visits to maximize her success with intensive lifestyle modifications for her multiple health conditions.   Attestations:   I, Special Puri, acting as a stage manager for [employer], DO., have compiled all relevant documentation for today's office visit on behalf of Theresa Jenkins, DO, while in the presence of [employer], DO.  Pertinent positives were addressed with patient today. Reviewed by clinician on day of visit: allergies, medications, problem  list, medical history, surgical history, family history, social history, and previous encounter notes.  I have reviewed the above documentation for accuracy and completeness, and I agree with the above. [name], D.O.  The 21st Century Cures Act was signed into law in 2016 which includes the topic of electronic health records.  This provides immediate access to information in MyChart. This includes consultation notes, operative notes, office notes, lab results and pathology reports.  If you have any questions about what you read please let us  know at your next visit so we can discuss your concerns and take corrective action if need be.  We are right here with you.

## 2024-01-23 ENCOUNTER — Other Ambulatory Visit (INDEPENDENT_AMBULATORY_CARE_PROVIDER_SITE_OTHER): Payer: Self-pay | Admitting: Family Medicine

## 2024-01-23 DIAGNOSIS — H40053 Ocular hypertension, bilateral: Secondary | ICD-10-CM | POA: Diagnosis not present

## 2024-01-23 DIAGNOSIS — E113293 Type 2 diabetes mellitus with mild nonproliferative diabetic retinopathy without macular edema, bilateral: Secondary | ICD-10-CM | POA: Diagnosis not present

## 2024-01-23 DIAGNOSIS — E1169 Type 2 diabetes mellitus with other specified complication: Secondary | ICD-10-CM

## 2024-02-08 ENCOUNTER — Encounter (INDEPENDENT_AMBULATORY_CARE_PROVIDER_SITE_OTHER): Payer: Self-pay | Admitting: Family Medicine

## 2024-02-08 ENCOUNTER — Ambulatory Visit (INDEPENDENT_AMBULATORY_CARE_PROVIDER_SITE_OTHER): Payer: Self-pay | Admitting: Family Medicine

## 2024-02-08 VITALS — BP 117/75 | HR 58 | Temp 98.0°F | Ht 61.0 in | Wt 235.0 lb

## 2024-02-08 DIAGNOSIS — Z7984 Long term (current) use of oral hypoglycemic drugs: Secondary | ICD-10-CM | POA: Diagnosis not present

## 2024-02-08 DIAGNOSIS — E1169 Type 2 diabetes mellitus with other specified complication: Secondary | ICD-10-CM

## 2024-02-08 DIAGNOSIS — Z6841 Body Mass Index (BMI) 40.0 and over, adult: Secondary | ICD-10-CM | POA: Diagnosis not present

## 2024-02-08 DIAGNOSIS — E559 Vitamin D deficiency, unspecified: Secondary | ICD-10-CM

## 2024-02-08 DIAGNOSIS — E1159 Type 2 diabetes mellitus with other circulatory complications: Secondary | ICD-10-CM

## 2024-02-08 DIAGNOSIS — Z7985 Long-term (current) use of injectable non-insulin antidiabetic drugs: Secondary | ICD-10-CM | POA: Diagnosis not present

## 2024-02-08 DIAGNOSIS — E669 Obesity, unspecified: Secondary | ICD-10-CM

## 2024-02-08 DIAGNOSIS — I152 Hypertension secondary to endocrine disorders: Secondary | ICD-10-CM | POA: Diagnosis not present

## 2024-02-08 NOTE — Progress Notes (Signed)
 [name], D.O.  ABFM, ABOM Specializing in Clinical Bariatric Medicine  Office located at: [address] [place], [place]  [address]    FOR THE CHRONIC DISEASE OF OBESITY:   Morbid obesity (HCC) Starting BMI 50.29 BMI 45.0-49.9, adult (HCC)- current BMI 44.43  Weight Summary and Body Composition Analysis  Weight Lost Since Last Visit: 2lb  Weight Gained Since Last Visit: 0lb    Vitals Temp: 98 F (36.7 C) BP: 117/75 Pulse Rate: (!) 58 SpO2: 98 %   Anthropometric Measurements Height: 5' 1 (1.549 m) Weight: 235 lb (106.6 kg) BMI (Calculated): 44.43 Weight at Last Visit: 237lb Weight Lost Since Last Visit: 2lb Weight Gained Since Last Visit: 0lb Starting Weight: 266lb Total Weight Loss (lbs): 31 lb (14.1 kg) Peak Weight: 299lb   Body Composition  Body Fat %: 51.8 % Fat Mass (lbs): 122 lbs Muscle Mass (lbs): 107.8 lbs Total Body Water (lbs): 84 lbs Visceral Fat Rating : 19   Other Clinical Data Fasting: no Labs: no Today's Visit #: 55 Starting Date: 09/18/19    Chief complaint: Obesity  Interval History [name] is here for a follow-up office visit to discuss her progress with her obesity treatment plan. She is keeping a food journal and adhering to recommended goals of 1350-1450 calories and 90++ grams protein and states she is following her eating plan approximately 90 % of the time. She is walking 20 minutes 5 days per week  She has experienced a weight loss of 2 lbs since last OV on 01/11/2024.   She has been in and out of the hospital recently because her 65 y.o brother is dying of stage IV cancer.  Her dietary and life habits include:  - Tracking Calories/Macros: yes  - Eating More Whole Foods: yes  - Adequate Protein Intake: yes - has chicken, turkey, ham, or fish.   - Adequate Water Intake: yes   - Skipping Meals: no  - Sleeping 7-9 Hours/ Night: yes    01/11/24 13:00 02/08/24 13:00   Body Fat % 52.5 % 51.8 %   Muscle Mass (lbs) 107.2 lbs 107.8 lbs  Fat Mass (lbs) 124.8 lbs 122 lbs  Total Body Water (lbs) 85.4 lbs 84 lbs  Visceral Fat Rating  19 19   Counseling done on how various foods will affect these numbers and how to maximize success  Total lbs lost to date: - 31 lbs Total weight loss percentage to date: - 11.65 %   Nutritional and Behavioral Counseling:  We discussed the following today: work on managing stress, creating time for self-care and relaxation and celebration eating strategies  Additional resources provided today: n/a  Evidence-based interventions for health behavior change were utilized today including the discussion of self monitoring techniques, problem-solving barriers and SMART goal setting techniques.   Regarding patient's less desirable eating habits and patterns, we employed the technique of small changes.   SMART Goal(s) created today: n/a   Recommended Dietary Goals [name] is currently in the action stage of change. As such, her goal is to continue weight management plan.  She has agreed to continue journaling 1350-1450 calories and 90++ grams protein   Recommended Physical Activity Goals [name] has been advised to work up to 300-450 minutes of moderate intensity aerobic activity a week and strengthening exercises 2-3 times per week for cardiovascular health, weight loss maintenance and preservation of muscle mass.   She has agreed to do some form of exercise 45 minutes daily (e.g  at the [facility] [place])   Medical Interventions and Pharmacotherapy Previous Bariatric surgery: n/a Pharmacotherapy: See DM note.    OBESITY RELATED CONDITIONS ADDRESSED TODAY:    Type 2 diabetes mellitus in patient with obesity [facility]) Assessment & Plan: Lab Results  Component Value Date   HGBA1C 6.1 (H) 11/14/2023   HGBA1C 6.8 (H) 07/12/2023   HGBA1C 7.4 04/14/2023   HGBA1C 7.4 04/14/2023   INSULIN  23.8 05/03/2022   INSULIN  10.1 04/29/2021     Relevant medications: Semaglutide  2 mg wkly, Metformin  500 mg 0.5 tablet twice daily, Invokana 300 mg daily, and Lantus. Pt is weaning off the Lantus under the direction of her PCP. PCP recently changed Lantus by 2 units; pt currently taking 28 units in the AM and 13 units in the PM. Additionally, she will go off her Metformin  per her PCP/specialist after finishing her remaining 5 tablets. Denies symptoms associated w/ hypoglycemia or hyperglycemia. Good  control of hunger and cravings. Cont regimen and working on nutrition plan to decrease simple carbohydrates, increase lean proteins and exercise to promote weight loss.     Hypertension associated with type 2 diabetes mellitus [facility]) Assessment & Plan: BP Readings from Last 3 Encounters:  02/08/24 117/75  01/11/24 137/80  12/13/23 118/68   BP controlled today. No acute concerns. Cont Lasix 20 mg daily and Quinapril-Hydrochlorothiazide 20-25 mg daily. Cont working on nutrition plan to promote weight loss.    Vitamin D  deficiency Assessment & Plan: Lab Results  Component Value Date   VD25OH 76.6 11/14/2023   VD25OH 55.2 07/12/2023   VD25OH 42.9 04/14/2023   She is doing well on OTC Vit D 1,000 units daily and Ergocalciferol  50,000 units q 5 days;  continue supplementation. Consider rechecking levels next OV.   Objective:   PHYSICAL EXAM: Blood pressure 117/75, pulse (!) 58, temperature 98 F (36.7 C), height 5' 1 (1.549 m), weight 235 lb (106.6 kg), SpO2 98%. Body mass index is 44.4 kg/m.  General: she is overweight, cooperative and in no acute distress. PSYCH: Has normal mood, affect and thought process.   HEENT: EOMI, sclerae are anicteric. Lungs: Normal breathing effort, no conversational dyspnea. Extremities: Moves * 4 Neurologic: A and O * 3, good insight  DIAGNOSTIC DATA REVIEWED: BMET    Component Value Date/Time   NA 142 11/14/2023 1053   K 4.5 11/14/2023 1053   CL 102 11/14/2023 1053   CO2 27 11/14/2023 1053    GLUCOSE 48 (L) 11/14/2023 1053   GLUCOSE 110 (H) 03/02/2015 1534   BUN 25 11/14/2023 1053   CREATININE 0.97 11/14/2023 1053   CALCIUM  9.8 11/14/2023 1053   GFRNONAA 66 10/27/2020 0000   GFRAA 77 10/27/2020 0000   Lab Results  Component Value Date   HGBA1C 6.1 (H) 11/14/2023   HGBA1C 9.1 06/27/2006   Lab Results  Component Value Date   INSULIN  23.8 05/03/2022   INSULIN  10.1 04/29/2021   Lab Results  Component Value Date   TSH 0.609 11/14/2023   CBC    Component Value Date/Time   WBC 6.3 11/14/2023 1053   WBC 6.7 03/02/2015 1534   RBC 5.07 11/14/2023 1053   RBC 5.48 (A) 04/14/2023 0000   HGB 13.4 11/14/2023 1053   HCT 44.1 11/14/2023 1053   PLT 302 11/14/2023 1053   MCV 87 11/14/2023 1053   MCH 26.4 (L) 11/14/2023 1053   MCH 27.0 03/02/2015 1534   MCHC 30.4 (L) 11/14/2023 1053   MCHC 31.3 03/02/2015 1534   RDW  13.7 11/14/2023 1053   Iron Studies No results found for: IRON, TIBC, FERRITIN, IRONPCTSAT Lipid Panel     Component Value Date/Time   CHOL 154 04/14/2023 0000   CHOL 145 05/03/2022 1018   TRIG 119 04/14/2023 0000   HDL 53 04/14/2023 0000   HDL 44 05/03/2022 1018   CHOLHDL 3.3 05/03/2022 1018   CHOLHDL 2.6 Ratio 06/27/2008 2132   VLDL 23 06/27/2008 2132   LDLCALC 79 04/14/2023 0000   LDLCALC 78 05/03/2022 1018   LDLDIRECT 66 04/09/2009 2134   Hepatic Function Panel     Component Value Date/Time   PROT 7.0 11/14/2023 1053   ALBUMIN 4.1 11/14/2023 1053   AST 16 11/14/2023 1053   ALT 16 11/14/2023 1053   ALKPHOS 77 11/14/2023 1053   BILITOT 0.6 11/14/2023 1053      Component Value Date/Time   TSH 0.609 11/14/2023 1053   Nutritional Lab Results  Component Value Date   VD25OH 76.6 11/14/2023   VD25OH 55.2 07/12/2023   VD25OH 42.9 04/14/2023     Follow up:   Return 03/26/2024 at 2:40 PM.  She was informed of the importance of frequent follow up visits to maximize her success with intensive lifestyle modifications for her multiple  health conditions.   Attestations:   I, Special Puri, acting as a stage manager for [employer], DO., have compiled all relevant documentation for today's office visit on behalf of Theresa Jenkins, DO, while in the presence of [employer], DO.  Pertinent positives were addressed with patient today. Reviewed by clinician on day of visit: allergies, medications, problem list, medical history, surgical history, family history, social history, and previous encounter notes.  I have reviewed the above documentation for accuracy and completeness, and I agree with the above. Theresa JINNY [name], D.O.  The 21st Century Cures Act was signed into law in 2016 which includes the topic of electronic health records.  This provides immediate access to information in MyChart. This includes consultation notes, operative notes, office notes, lab results and pathology reports.  If you have any questions about what you read please let us  know at your next visit so we can discuss your concerns and take corrective action if need be.  We are right here with you.

## 2024-03-24 ENCOUNTER — Other Ambulatory Visit (INDEPENDENT_AMBULATORY_CARE_PROVIDER_SITE_OTHER): Payer: Self-pay | Admitting: Family Medicine

## 2024-03-24 DIAGNOSIS — E669 Obesity, unspecified: Secondary | ICD-10-CM

## 2024-03-26 ENCOUNTER — Ambulatory Visit (INDEPENDENT_AMBULATORY_CARE_PROVIDER_SITE_OTHER): Admitting: Family Medicine

## 2024-03-26 ENCOUNTER — Encounter (INDEPENDENT_AMBULATORY_CARE_PROVIDER_SITE_OTHER): Payer: Self-pay | Admitting: Family Medicine

## 2024-03-26 DIAGNOSIS — I152 Hypertension secondary to endocrine disorders: Secondary | ICD-10-CM

## 2024-03-26 DIAGNOSIS — Z6841 Body Mass Index (BMI) 40.0 and over, adult: Secondary | ICD-10-CM | POA: Diagnosis not present

## 2024-03-26 DIAGNOSIS — Z7984 Long term (current) use of oral hypoglycemic drugs: Secondary | ICD-10-CM

## 2024-03-26 DIAGNOSIS — I10 Essential (primary) hypertension: Secondary | ICD-10-CM

## 2024-03-26 DIAGNOSIS — E669 Obesity, unspecified: Secondary | ICD-10-CM

## 2024-03-26 DIAGNOSIS — E559 Vitamin D deficiency, unspecified: Secondary | ICD-10-CM

## 2024-03-26 DIAGNOSIS — Z7985 Long-term (current) use of injectable non-insulin antidiabetic drugs: Secondary | ICD-10-CM

## 2024-03-26 DIAGNOSIS — E1159 Type 2 diabetes mellitus with other circulatory complications: Secondary | ICD-10-CM | POA: Diagnosis not present

## 2024-03-26 DIAGNOSIS — E1169 Type 2 diabetes mellitus with other specified complication: Secondary | ICD-10-CM

## 2024-03-26 MED ORDER — SEMAGLUTIDE (2 MG/DOSE) 8 MG/3ML ~~LOC~~ SOPN: 2.0000 mg | PEN_INJECTOR | SUBCUTANEOUS | 0 refills | Status: AC

## 2024-03-26 NOTE — Progress Notes (Signed)
 "  [name], D.O.  ABFM, ABOM Specializing in Clinical Bariatric Medicine  Office located at: [address] [place], [place]  [address]      A) FOR THE CHRONIC DISEASE OF OBESITY:  Morbid obesity (HCC) Starting BMI 50.29 BMI 45.0-49.9, adult (HCC)- current BMI 44.05  Chief complaint: Obesity [name] is here to discuss her progress with her obesity treatment plan.   History of present illness / Interval history:  [name] is here today for her follow-up office visit.  Since last OV on 02/08/2024, pt is down 2 lbs.    02/08/24 13:00 03/26/24 14:00   Body Fat % 51.8 % 52.5 %  Muscle Mass (lbs) 107.8 lbs 105.2 lbs  Fat Mass (lbs) 122 lbs 122.4 lbs  Total Body Water (lbs) 84 lbs 84.4 lbs  Visceral Fat Rating  19 19  Counseling done on how various foods will affect these numbers and how to maximize success   Total lbs lost to date: -33 lbs Total Fat Mass in lbs lost to date: -28.4 Total weight loss percentage to date: -12.41 %   Nutrition Therapy She is  keeping a food journal and adhering to recommended goals of 1350-1450 calories and 90++ grams protein and states she is following her eating plan approximately 96 % of the time.   - Tracking Calories/Macros: yes  - Eating More Whole Foods: yes  - Adequate Protein Intake: yes  - Adequate Water Intake: yes  - Skipping Meals: no -    - Sleeping 7-9 Hours/ Night: yes   [name] is currently in the action stage of change. As such, her goal is to continue weight management plan.  She has agreed to: continue current plan   Physical Activity Pt is walking 15  minutes 5 days per week   [name] has been advised to work up to 300-450 minutes of moderate intensity aerobic activity a week and strengthening exercises 2-3 times per week for cardiovascular health, weight loss maintenance and preservation of muscle mass.  She has agreed to : Think about enjoyable ways to increase daily physical activity and  overcoming barriers to exercise, Increase physical activity in their day and reduce sedentary time (increase NEAT)., Continue to gradually increase the amount and intensity of exercise routine, and Increase volume of physical activity to a goal of 240 minutes a week   Behavioral Modifications Evidence-based interventions for health behavior change were utilized today including the discussion of  1) self monitoring techniques:  Journaling 2) self care:  exercise 3) SMART goals for next OV:  Walk for 15 minutes twice a day and increase protein intake. Regarding patient's less desirable eating habits and patterns, we employed the technique of small changes.   We discussed the following today: increasing lean protein intake to established goals, increasing vegetables, and continue to work on implementation of reduced calorie nutritional plan Additional resources provided today: None   Medical Interventions/ Pharmacotherapy Previous Bariatric surgery: none Pharmacotherapy for weight loss: She is currently taking Metformin  500 mg 0.5 tab twice daily and Semaglutide  2 mg once weekly.  for medical weight loss.    We discussed various medication options to help [name] with her weight loss efforts and we both agreed to : Continue with current nutritional and behavioral strategies   B) OBESITY RELATED CONDITIONS ADDRESSED TODAY:  RECHECK VIT D TODAY  Type 2 diabetes mellitus in patient with obesity [facility]) Assessment & Plan Lab Results  Component Value Date   HGBA1C 6.1 (  H) 11/14/2023   HGBA1C 6.8 (H) 07/12/2023   HGBA1C 7.4 04/14/2023   HGBA1C 7.4 04/14/2023   INSULIN  23.8 05/03/2022   INSULIN  10.1 04/29/2021  Managed by Semglutide 2 mg once weekly, Metformin  500 mg 0.5 tablet twice daily, Invokana 300 mg daily, and Lantus with 42 units in the morning and 24 units in the afternoon with good compliance and tolerance. Hunger and cravings are well controlled. Cont regimen(refill semaglutide   today) and prudent nutritional meal plan. Increase exercise as able.     Hypertension associated with type 2 diabetes mellitus [facility]) Assessment & Plan BP Readings from Last 3 Encounters:  03/26/24 109/68  02/08/24 117/75  01/11/24 137/80   The 10-year ASCVD risk score (Arnett DK, et al., 2019) is: 12.3%* (Cholesterol units were assumed)  Lab Results  Component Value Date   CREATININE 0.97 11/14/2023  Managed by Lasix 20 mg daily and Quinapril-hydrochlorothiazide 20-25 mg daily with good compliance and tolerance. BP is controlled today at 109/68. Pt asx. No acute concerns. Cont regimen. Cont following low-sodium, heart-healthy diet.     Vitamin D  deficiency Assessment & Plan Lab Results  Component Value Date   VD25OH 76.6 11/14/2023   VD25OH 55.2 07/12/2023   VD25OH 42.9 04/14/2023  Currently taking OTC Vit D 1,000 units daily and Ergo 50K units every 5 days with good compliance and tolerance. Vit D levels are at goal. Cont supplementation. Recheck today.     Medications Discontinued During This Encounter  Medication Reason   Semaglutide , 2 MG/DOSE, 8 MG/3ML SOPN Reorder     Meds ordered this encounter  Medications   Semaglutide , 2 MG/DOSE, 8 MG/3ML SOPN    Sig: Inject 2 mg as directed once a week.    Dispense:  9 each    Refill:  0      Follow up:   Return 04/23/2024 2:40 PM.  She was informed of the importance of frequent follow up visits to maximize her success with intensive lifestyle modifications for her multiple health conditions.   Weight Summary and Biometrics   Weight Lost Since Last Visit: 2lb  Weight Gained Since Last Visit: 0lb    Vitals Temp: 98.7 F (37.1 C) BP: 109/68 Pulse Rate: 68 SpO2: 96 %   Anthropometric Measurements Height: 5' 1 (1.549 m) Weight: 233 lb (105.7 kg) BMI (Calculated): 44.05 Weight at Last Visit: 235lb Weight Lost Since Last Visit: 2lb Weight Gained Since Last Visit: 0lb Starting Weight: 266lb Total Weight Loss  (lbs): 33 lb (15 kg) Peak Weight: 299lb   Body Composition  Body Fat %: 52.5 % Fat Mass (lbs): 122.4 lbs Muscle Mass (lbs): 105.2 lbs Total Body Water (lbs): 84.4 lbs Visceral Fat Rating : 19   Other Clinical Data Fasting: no Labs: no Today's Visit #: 56 Starting Date: 09/18/19    Objective:   PHYSICAL EXAM: Blood pressure 109/68, pulse 68, temperature 98.7 F (37.1 C), height 5' 1 (1.549 m), weight 233 lb (105.7 kg), SpO2 96%. Body mass index is 44.02 kg/m.  General: she is overweight, cooperative and in no acute distress. PSYCH: Has normal mood, affect and thought process.   HEENT: EOMI, sclerae are anicteric. Lungs: Normal breathing effort, no conversational dyspnea. Extremities: Moves * 4 Neurologic: A and O * 3, good insight  DIAGNOSTIC DATA REVIEWED: BMET    Component Value Date/Time   NA 142 11/14/2023 1053   K 4.5 11/14/2023 1053   CL 102 11/14/2023 1053   CO2 27 11/14/2023 1053   GLUCOSE 48 (L)  11/14/2023 1053   GLUCOSE 110 (H) 03/02/2015 1534   BUN 25 11/14/2023 1053   CREATININE 0.97 11/14/2023 1053   CALCIUM  9.8 11/14/2023 1053   GFRNONAA 66 10/27/2020 0000   GFRAA 77 10/27/2020 0000   Lab Results  Component Value Date   HGBA1C 6.1 (H) 11/14/2023   HGBA1C 9.1 06/27/2006   Lab Results  Component Value Date   INSULIN  23.8 05/03/2022   INSULIN  10.1 04/29/2021   Lab Results  Component Value Date   TSH 0.609 11/14/2023   CBC    Component Value Date/Time   WBC 6.3 11/14/2023 1053   WBC 6.7 03/02/2015 1534   RBC 5.07 11/14/2023 1053   RBC 5.48 (A) 04/14/2023 0000   HGB 13.4 11/14/2023 1053   HCT 44.1 11/14/2023 1053   PLT 302 11/14/2023 1053   MCV 87 11/14/2023 1053   MCH 26.4 (L) 11/14/2023 1053   MCH 27.0 03/02/2015 1534   MCHC 30.4 (L) 11/14/2023 1053   MCHC 31.3 03/02/2015 1534   RDW 13.7 11/14/2023 1053   Iron Studies No results found for: IRON, TIBC, FERRITIN, IRONPCTSAT Lipid Panel     Component Value Date/Time    CHOL 154 04/14/2023 0000   CHOL 145 05/03/2022 1018   TRIG 119 04/14/2023 0000   HDL 53 04/14/2023 0000   HDL 44 05/03/2022 1018   CHOLHDL 3.3 05/03/2022 1018   CHOLHDL 2.6 Ratio 06/27/2008 2132   VLDL 23 06/27/2008 2132   LDLCALC 79 04/14/2023 0000   LDLCALC 78 05/03/2022 1018   LDLDIRECT 66 04/09/2009 2134   Hepatic Function Panel     Component Value Date/Time   PROT 7.0 11/14/2023 1053   ALBUMIN 4.1 11/14/2023 1053   AST 16 11/14/2023 1053   ALT 16 11/14/2023 1053   ALKPHOS 77 11/14/2023 1053   BILITOT 0.6 11/14/2023 1053      Component Value Date/Time   TSH 0.609 11/14/2023 1053   Nutritional Lab Results  Component Value Date   VD25OH 76.6 11/14/2023   VD25OH 55.2 07/12/2023   VD25OH 42.9 04/14/2023    Attestations:   I, Feliciano Mingle, acting as a stage manager for [employer], DO., have compiled all relevant documentation for today's office visit on behalf of Theresa Jenkins, DO, while in the presence of [employer], DO.    I have reviewed the above documentation for accuracy and completeness, and I agree with the above. [name], D.O.  The 21st Century Cures Act was signed into law in 2016 which includes the topic of electronic health records.  This provides immediate access to information in MyChart.  This includes consultation notes, operative notes, office notes, lab results and pathology reports.  If you have any questions about what you read please let us  know at your next visit so we can discuss your concerns and take corrective action if need be.  We are right here with you.  "

## 2024-03-27 LAB — VITAMIN D 25 HYDROXY (VIT D DEFICIENCY, FRACTURES): Vit D, 25-Hydroxy: 56.8 ng/mL (ref 30.0–100.0)

## 2024-03-29 ENCOUNTER — Ambulatory Visit (INDEPENDENT_AMBULATORY_CARE_PROVIDER_SITE_OTHER): Payer: Self-pay | Admitting: Family Medicine

## 2024-04-11 ENCOUNTER — Other Ambulatory Visit: Payer: Self-pay | Admitting: Internal Medicine

## 2024-04-11 DIAGNOSIS — Z1231 Encounter for screening mammogram for malignant neoplasm of breast: Secondary | ICD-10-CM

## 2024-04-22 ENCOUNTER — Encounter (INDEPENDENT_AMBULATORY_CARE_PROVIDER_SITE_OTHER): Payer: Self-pay

## 2024-04-23 ENCOUNTER — Ambulatory Visit (INDEPENDENT_AMBULATORY_CARE_PROVIDER_SITE_OTHER): Admitting: Family Medicine

## 2024-04-24 ENCOUNTER — Ambulatory Visit (INDEPENDENT_AMBULATORY_CARE_PROVIDER_SITE_OTHER): Admitting: Family Medicine

## 2024-04-24 ENCOUNTER — Encounter (INDEPENDENT_AMBULATORY_CARE_PROVIDER_SITE_OTHER): Payer: Self-pay | Admitting: Family Medicine

## 2024-04-24 DIAGNOSIS — Z6841 Body Mass Index (BMI) 40.0 and over, adult: Secondary | ICD-10-CM

## 2024-04-24 DIAGNOSIS — E1169 Type 2 diabetes mellitus with other specified complication: Secondary | ICD-10-CM

## 2024-04-24 DIAGNOSIS — E669 Obesity, unspecified: Secondary | ICD-10-CM

## 2024-04-24 DIAGNOSIS — I152 Hypertension secondary to endocrine disorders: Secondary | ICD-10-CM

## 2024-04-24 DIAGNOSIS — E559 Vitamin D deficiency, unspecified: Secondary | ICD-10-CM

## 2024-04-24 MED ORDER — ERGOCALCIFEROL 1.25 MG (50000 UT) PO CAPS: ORAL_CAPSULE | ORAL | Status: AC

## 2024-04-24 MED ORDER — METFORMIN HCL 500 MG PO TABS: ORAL_TABLET | ORAL | 0 refills | Status: AC

## 2024-05-09 ENCOUNTER — Ambulatory Visit

## 2024-06-19 ENCOUNTER — Ambulatory Visit (INDEPENDENT_AMBULATORY_CARE_PROVIDER_SITE_OTHER): Admitting: Family Medicine
